# Patient Record
Sex: Female | Born: 1955 | Race: White | Hispanic: No | Marital: Married | Smoking: Former smoker
Health system: Southern US, Community
[De-identification: ages and names within clinical notes are randomized; demographics above are authoritative.]

## PROBLEM LIST (undated history)

## (undated) DIAGNOSIS — K625 Hemorrhage of anus and rectum: Secondary | ICD-10-CM

## (undated) DIAGNOSIS — E785 Hyperlipidemia, unspecified: Secondary | ICD-10-CM

## (undated) DIAGNOSIS — R51 Headache: Secondary | ICD-10-CM

## (undated) DIAGNOSIS — Z8619 Personal history of other infectious and parasitic diseases: Secondary | ICD-10-CM

## (undated) DIAGNOSIS — Z87442 Personal history of urinary calculi: Secondary | ICD-10-CM

## (undated) DIAGNOSIS — T7840XA Allergy, unspecified, initial encounter: Secondary | ICD-10-CM

## (undated) DIAGNOSIS — K635 Polyp of colon: Secondary | ICD-10-CM

## (undated) DIAGNOSIS — H40059 Ocular hypertension, unspecified eye: Secondary | ICD-10-CM

## (undated) DIAGNOSIS — K759 Inflammatory liver disease, unspecified: Secondary | ICD-10-CM

## (undated) DIAGNOSIS — I82409 Acute embolism and thrombosis of unspecified deep veins of unspecified lower extremity: Secondary | ICD-10-CM

## (undated) DIAGNOSIS — F32A Depression, unspecified: Secondary | ICD-10-CM

## (undated) DIAGNOSIS — N390 Urinary tract infection, site not specified: Secondary | ICD-10-CM

## (undated) DIAGNOSIS — H269 Unspecified cataract: Secondary | ICD-10-CM

## (undated) DIAGNOSIS — F329 Major depressive disorder, single episode, unspecified: Secondary | ICD-10-CM

## (undated) DIAGNOSIS — I10 Essential (primary) hypertension: Secondary | ICD-10-CM

## (undated) HISTORY — DX: Ocular hypertension, unspecified eye: H40.059

## (undated) HISTORY — DX: Polyp of colon: K63.5

## (undated) HISTORY — DX: Urinary tract infection, site not specified: N39.0

## (undated) HISTORY — DX: Unspecified cataract: H26.9

## (undated) HISTORY — DX: Allergy, unspecified, initial encounter: T78.40XA

## (undated) HISTORY — PX: TONSILLECTOMY: SUR1361

## (undated) HISTORY — PX: BREAST EXCISIONAL BIOPSY: SUR124

## (undated) HISTORY — DX: Personal history of other infectious and parasitic diseases: Z86.19

## (undated) HISTORY — DX: Essential (primary) hypertension: I10

## (undated) HISTORY — PX: WISDOM TOOTH EXTRACTION: SHX21

## (undated) HISTORY — DX: Acute embolism and thrombosis of unspecified deep veins of unspecified lower extremity: I82.409

---

## 1977-12-07 DIAGNOSIS — I82409 Acute embolism and thrombosis of unspecified deep veins of unspecified lower extremity: Secondary | ICD-10-CM

## 1977-12-07 HISTORY — DX: Acute embolism and thrombosis of unspecified deep veins of unspecified lower extremity: I82.409

## 1992-12-07 HISTORY — PX: BREAST SURGERY: SHX581

## 1998-12-07 HISTORY — PX: ABDOMINAL HYSTERECTOMY: SHX81

## 1999-10-01 ENCOUNTER — Encounter (INDEPENDENT_AMBULATORY_CARE_PROVIDER_SITE_OTHER): Payer: Self-pay | Admitting: Specialist

## 1999-10-01 ENCOUNTER — Inpatient Hospital Stay (HOSPITAL_COMMUNITY): Admission: RE | Admit: 1999-10-01 | Discharge: 1999-10-03 | Payer: Self-pay | Admitting: Obstetrics & Gynecology

## 2003-09-07 ENCOUNTER — Other Ambulatory Visit: Admission: RE | Admit: 2003-09-07 | Discharge: 2003-09-07 | Payer: Self-pay | Admitting: Obstetrics & Gynecology

## 2004-12-04 ENCOUNTER — Encounter: Admission: RE | Admit: 2004-12-04 | Discharge: 2004-12-04 | Payer: Self-pay | Admitting: Neurology

## 2009-04-03 ENCOUNTER — Encounter: Admission: RE | Admit: 2009-04-03 | Discharge: 2009-04-03 | Payer: Self-pay | Admitting: Family Medicine

## 2009-05-03 ENCOUNTER — Emergency Department (HOSPITAL_COMMUNITY): Admission: EM | Admit: 2009-05-03 | Discharge: 2009-05-03 | Payer: Self-pay | Admitting: Emergency Medicine

## 2010-12-14 IMAGING — CR DG HAND COMPLETE 3+V*R*
3 series · 3 of 3 positions shown · non-contrast
Comparison: None

CLINICAL DATA: Swelling about the second and third metacarpal
phalangeal joints

RIGHT HAND - COMPLETE 3+ VIEW

[x hand pa right]
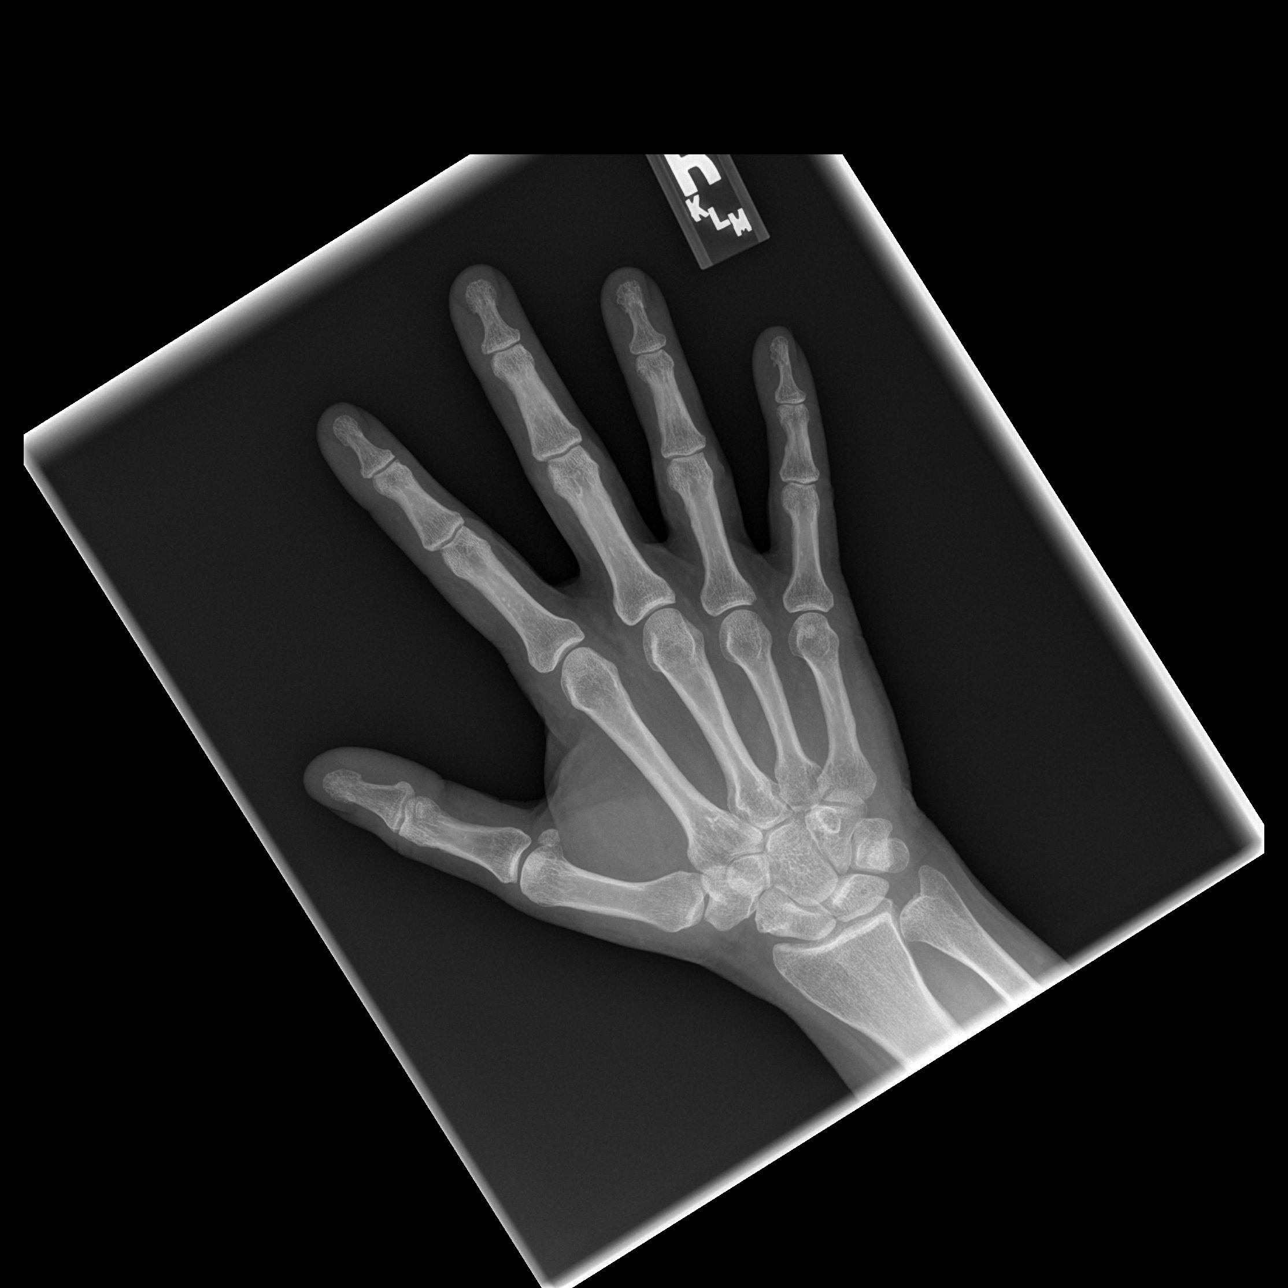

[x hand oblique right]
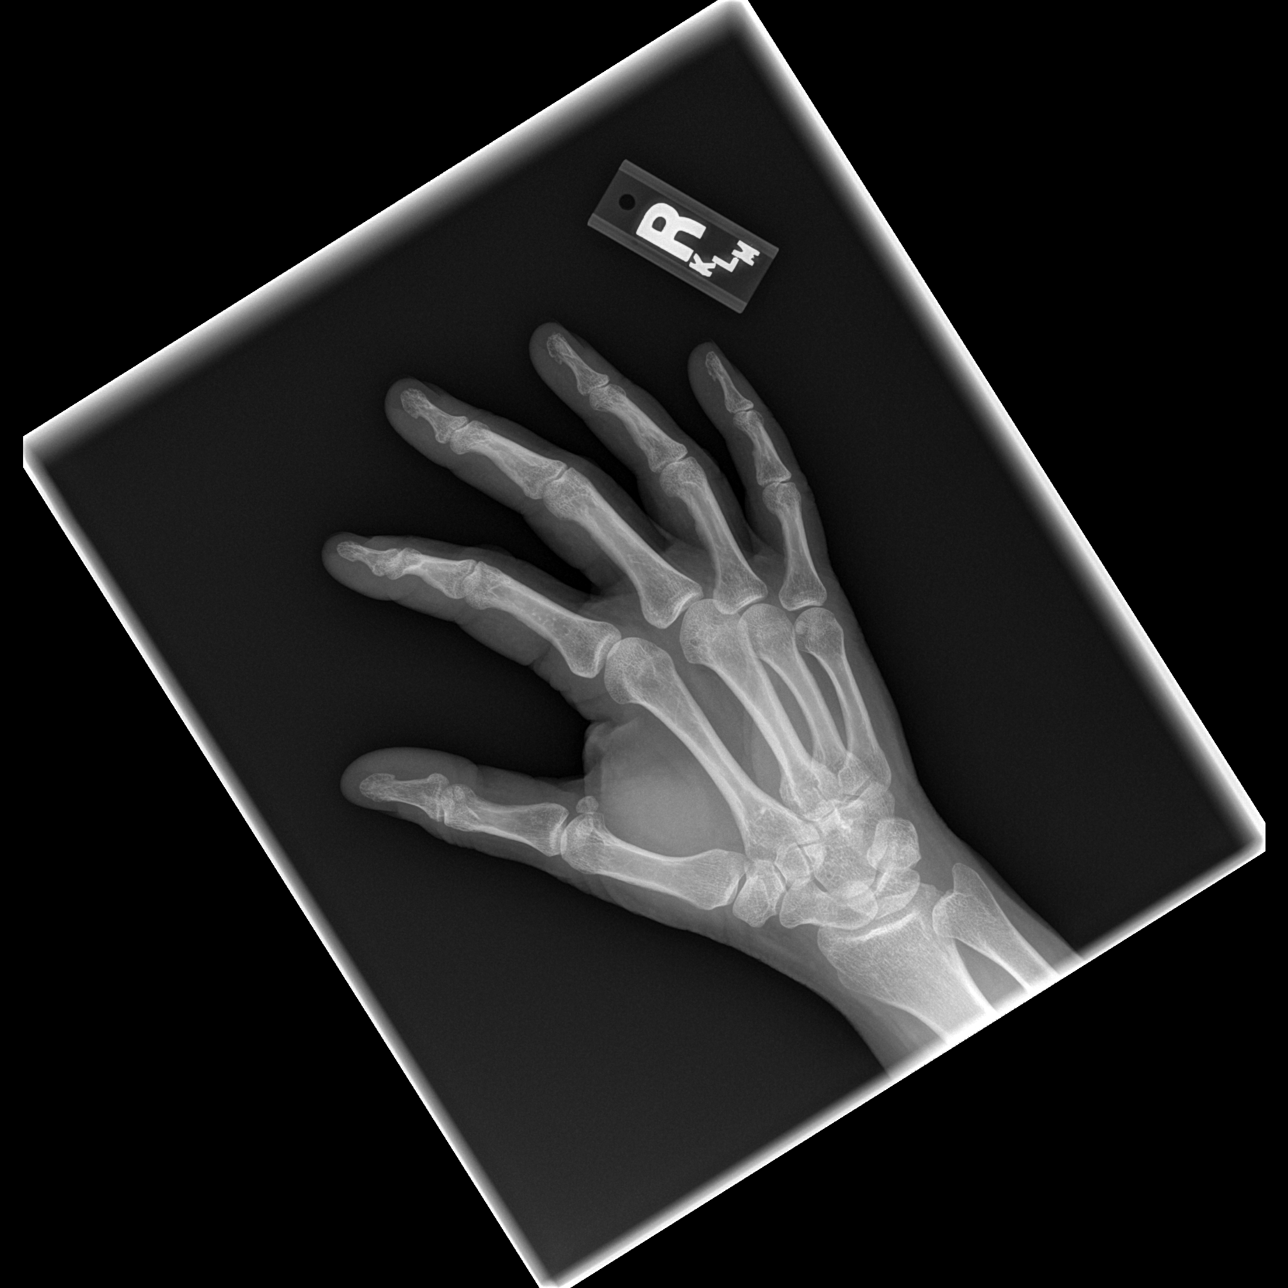

[x hand lat right]
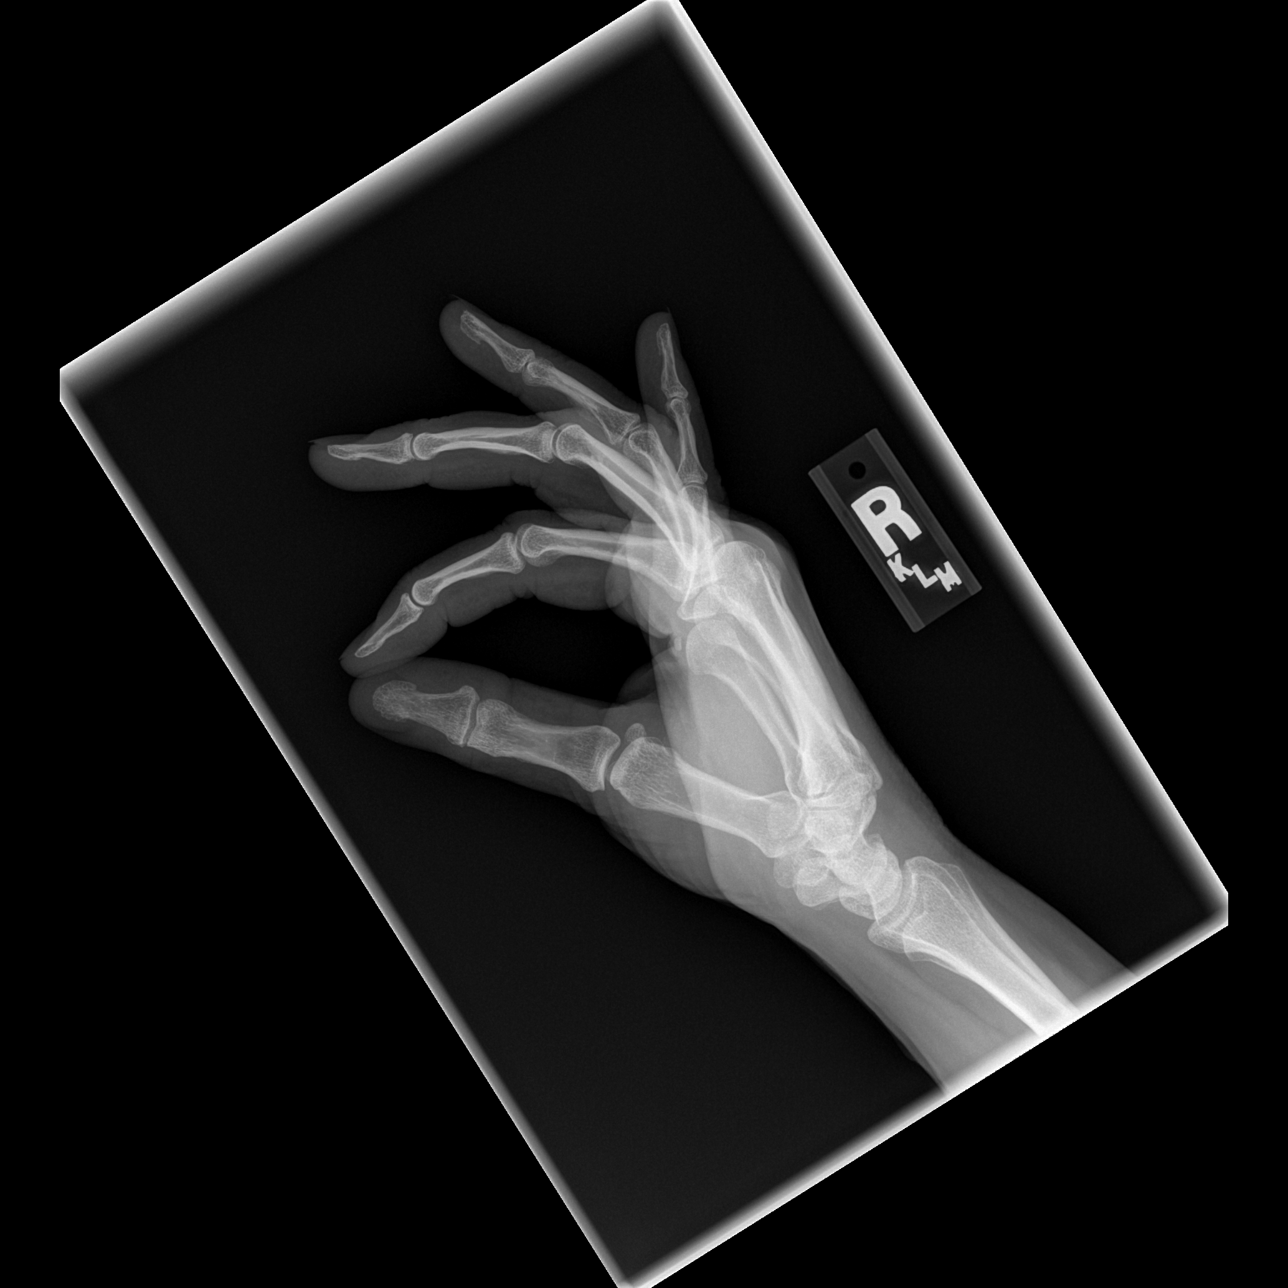

[3 of 3 positions shown; findings below may reference images not displayed]

FINDINGS: No acute fracture or dislocation.  Joint spaces are
maintained.  No erosive changes.  Soft tissues are unremarkable.
IMPRESSION: No acute bony pathology.

## 2010-12-14 IMAGING — CR DG HAND COMPLETE 3+V*L*
3 series · 3 of 3 positions shown · non-contrast
Comparison: None

CLINICAL DATA: Swelling about the second and third metacarpal
phalangeal joints

LEFT HAND - COMPLETE 3+ VIEW

[x hand pa left]
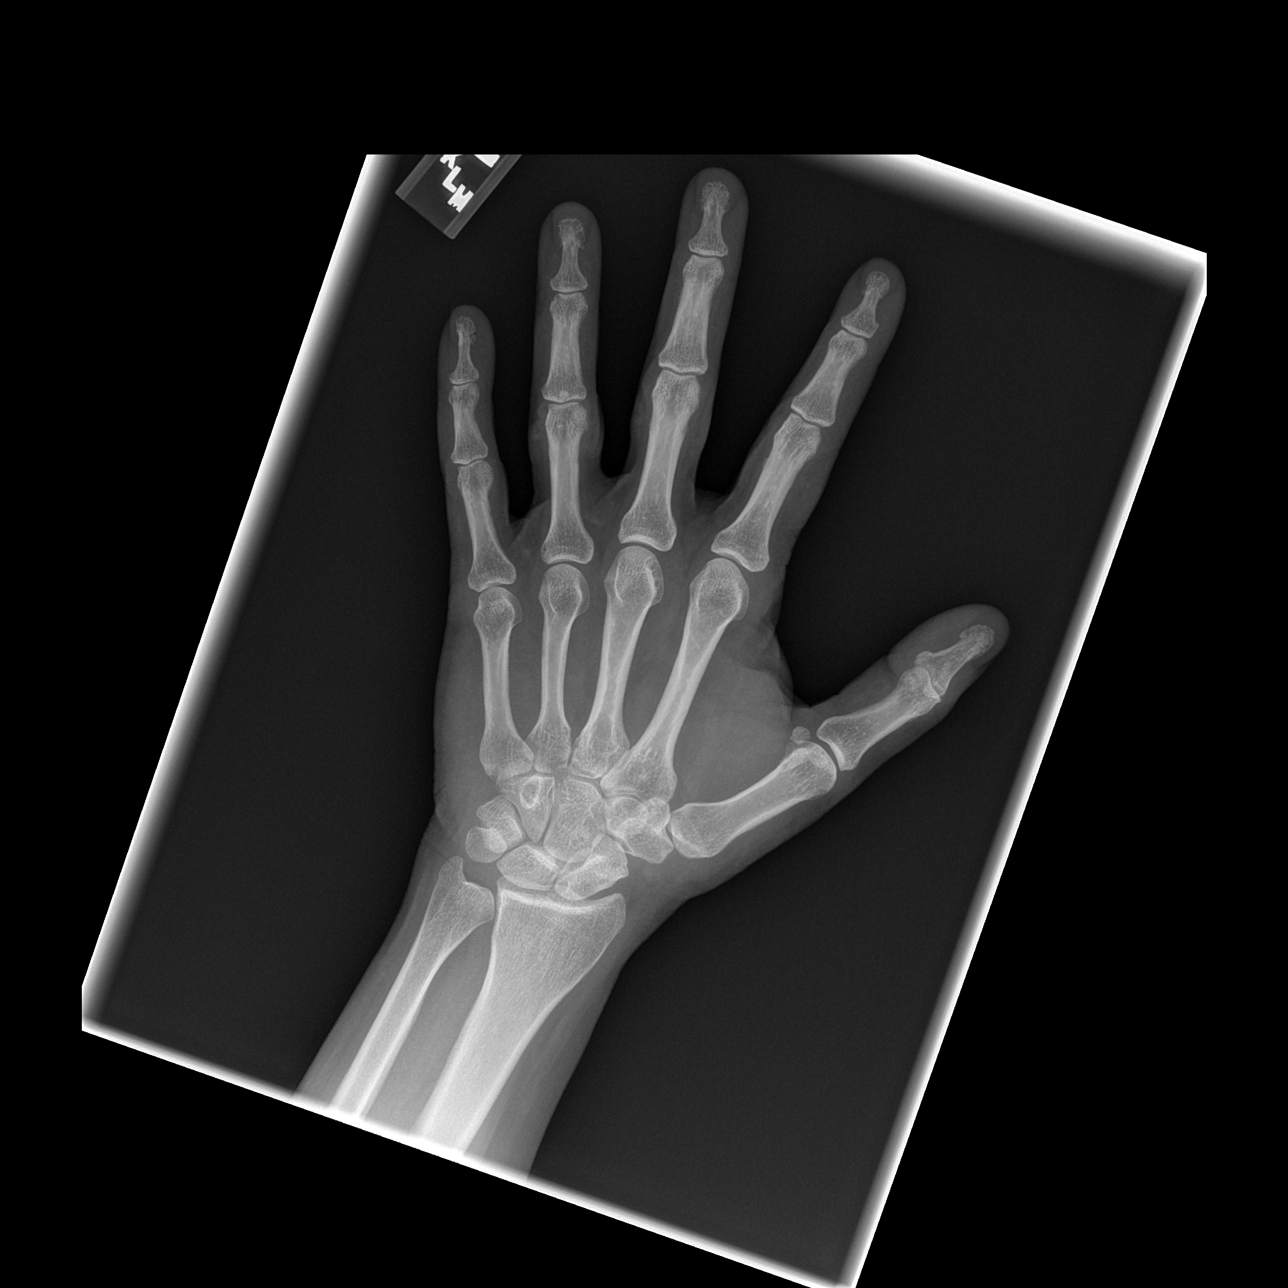

[x hand oblique left]
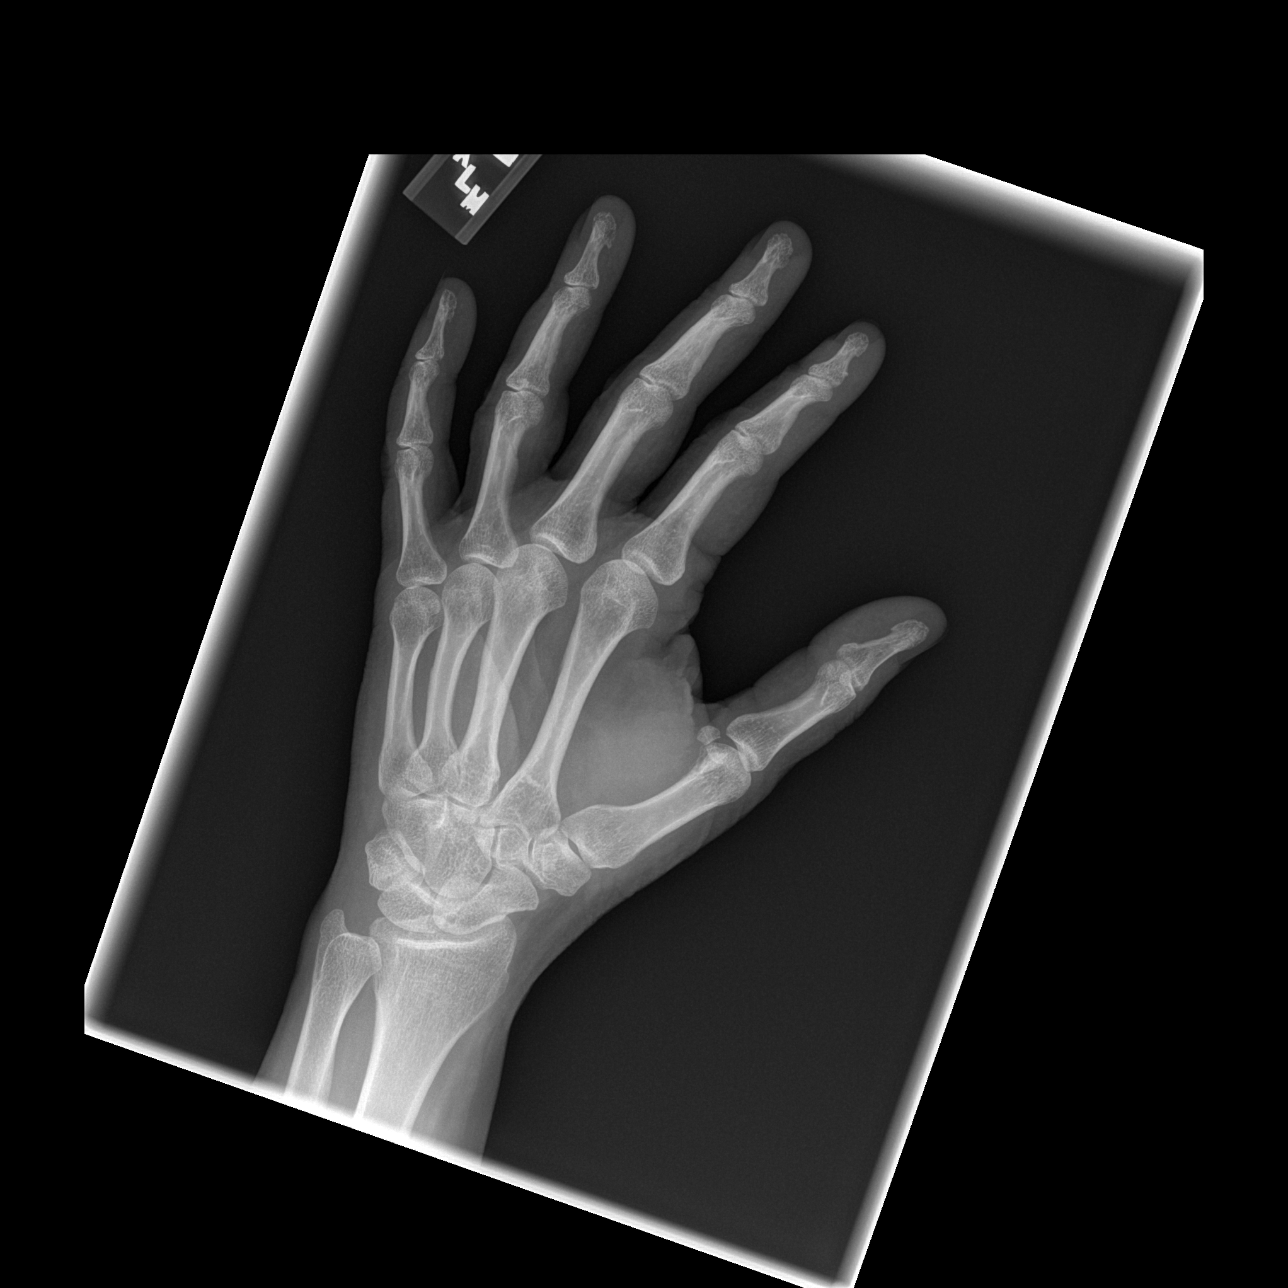

[x hand lat left]
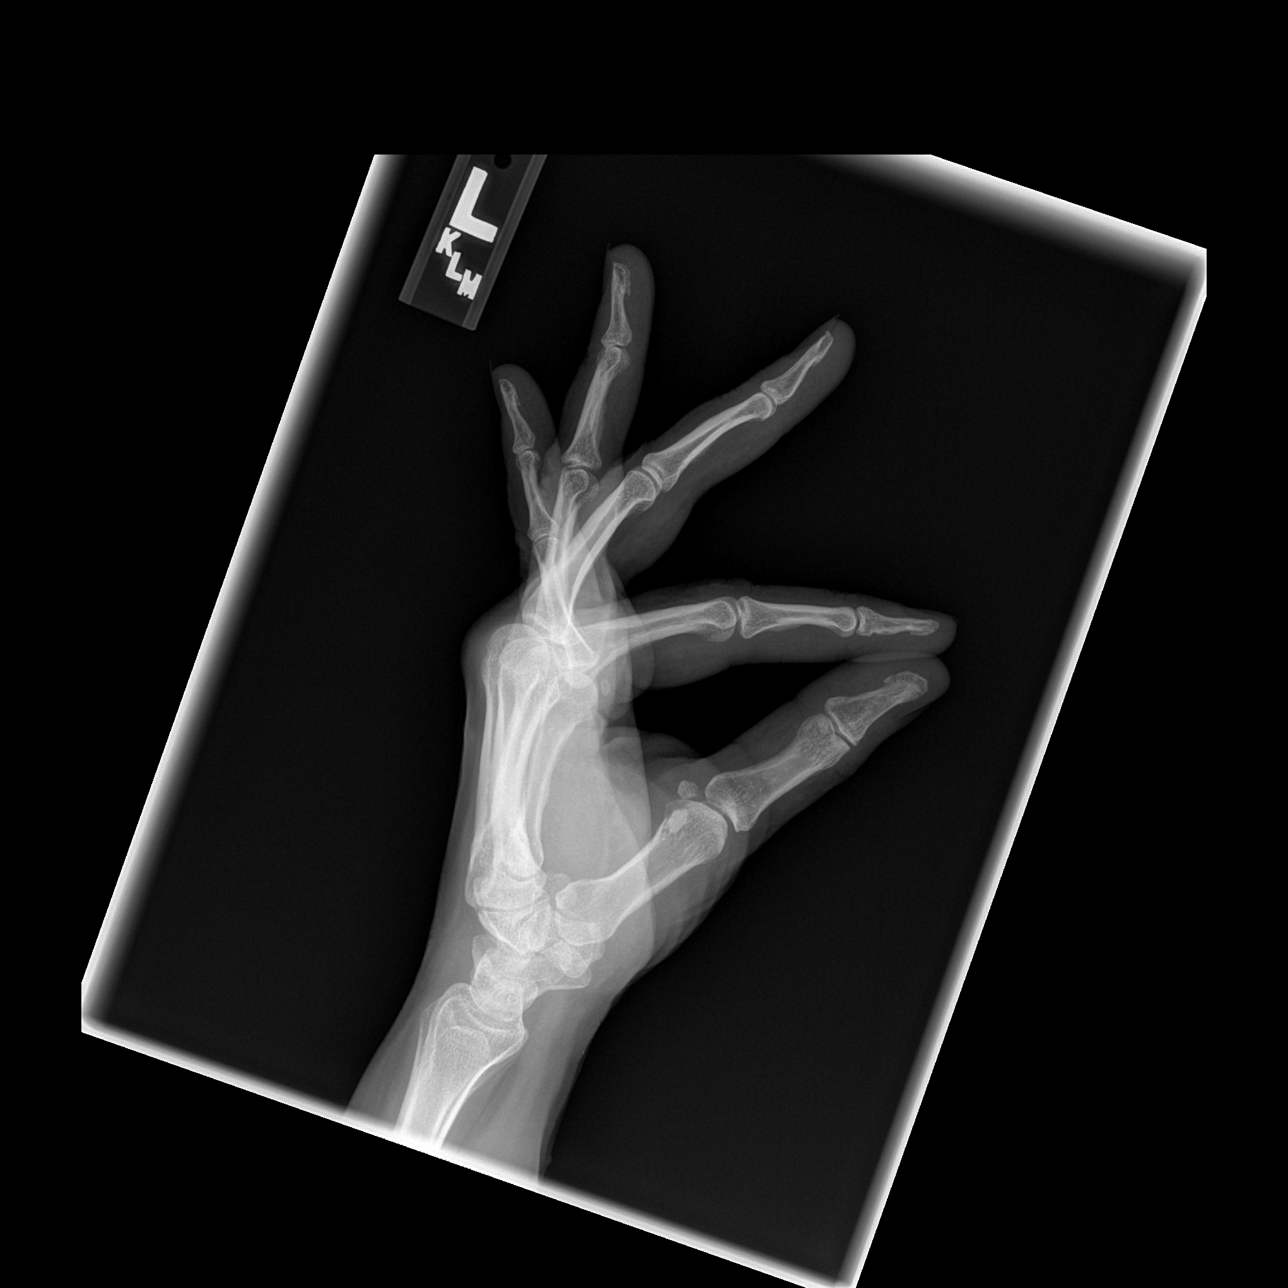

[3 of 3 positions shown; findings below may reference images not displayed]

FINDINGS: No acute fracture or dislocation.  Joint spaces are
maintained.  No erosive changes.  Soft tissues are unremarkable.
IMPRESSION: No acute bony pathology.

## 2011-03-17 LAB — URINE CULTURE: Colony Count: >=100000

## 2011-03-17 LAB — CBC
HCT: 40.1 % (ref 36.0–46.0)
Hemoglobin: 14.1 g/dL (ref 12.0–15.0)
MCHC: 35.2 g/dL (ref 30.0–36.0)
MCV: 90.4 fL (ref 78.0–100.0)
Platelets: 263 10*3/uL (ref 150–400)
RBC: 4.44 MIL/uL (ref 3.87–5.11)
RDW: 12.7 % (ref 11.5–15.5)
WBC: 6.1 10*3/uL (ref 4.0–10.5)

## 2011-03-17 LAB — URINALYSIS, ROUTINE W REFLEX MICROSCOPIC
Bilirubin Urine: NEGATIVE
Glucose, UA: NEGATIVE mg/dL
Hgb urine dipstick: NEGATIVE
Ketones, ur: NEGATIVE mg/dL
Nitrite: NEGATIVE
Protein, ur: NEGATIVE mg/dL
Specific Gravity, Urine: 1.006 (ref 1.005–1.030)
Urobilinogen, UA: 0.2 mg/dL (ref 0.0–1.0)
pH: 7.5 (ref 5.0–8.0)

## 2011-03-17 LAB — POCT CARDIAC MARKERS
CKMB, poc: <1 ng/mL — ABNORMAL LOW (ref 1.0–8.0)
Myoglobin, poc: 47.1 ng/mL (ref 12–200)
Troponin i, poc: <0.05 ng/mL (ref 0.00–0.09)

## 2011-03-17 LAB — DIFFERENTIAL
Basophils Absolute: 0.1 10*3/uL (ref 0.0–0.1)
Basophils Relative: 1 % (ref 0–1)
Eosinophils Absolute: 0 10*3/uL (ref 0.0–0.7)
Eosinophils Relative: 1 % (ref 0–5)
Lymphocytes Relative: 31 % (ref 12–46)
Lymphs Abs: 1.9 10*3/uL (ref 0.7–4.0)
Monocytes Absolute: 0.5 10*3/uL (ref 0.1–1.0)
Monocytes Relative: 9 % (ref 3–12)
Neutro Abs: 3.6 10*3/uL (ref 1.7–7.7)
Neutrophils Relative %: 58 % (ref 43–77)

## 2011-05-29 ENCOUNTER — Emergency Department (HOSPITAL_BASED_OUTPATIENT_CLINIC_OR_DEPARTMENT_OTHER)
Admission: EM | Admit: 2011-05-29 | Discharge: 2011-05-29 | Disposition: A | Discharge status: Home / Self Care | Payer: 59 | Attending: Emergency Medicine | Admitting: Emergency Medicine

## 2011-05-29 ENCOUNTER — Emergency Department (INDEPENDENT_AMBULATORY_CARE_PROVIDER_SITE_OTHER): Payer: 59

## 2011-05-29 DIAGNOSIS — M549 Dorsalgia, unspecified: Secondary | ICD-10-CM | POA: Insufficient documentation

## 2011-05-29 DIAGNOSIS — E7800 Pure hypercholesterolemia, unspecified: Secondary | ICD-10-CM | POA: Insufficient documentation

## 2011-05-29 DIAGNOSIS — R109 Unspecified abdominal pain: Secondary | ICD-10-CM

## 2011-05-29 DIAGNOSIS — E119 Type 2 diabetes mellitus without complications: Secondary | ICD-10-CM | POA: Insufficient documentation

## 2011-05-29 DIAGNOSIS — R091 Pleurisy: Secondary | ICD-10-CM | POA: Insufficient documentation

## 2011-05-29 LAB — DIFFERENTIAL
Basophils Absolute: 0.1 10*3/uL (ref 0.0–0.1)
Basophils Relative: 1 % (ref 0–1)
Eosinophils Absolute: 0.1 10*3/uL (ref 0.0–0.7)
Eosinophils Relative: 1 % (ref 0–5)
Monocytes Absolute: 0.9 10*3/uL (ref 0.1–1.0)
Monocytes Relative: 11 % (ref 3–12)
Neutrophils Relative %: 56 % (ref 43–77)

## 2011-05-29 LAB — BASIC METABOLIC PANEL
BUN: 13 mg/dL (ref 6–23)
Calcium: 9.5 mg/dL (ref 8.4–10.5)
GFR calc Af Amer: >60 mL/min (ref >60)
GFR calc non Af Amer: >60 mL/min (ref >60)
Glucose, Bld: 144 mg/dL — ABNORMAL HIGH (ref 70–99)
Potassium: 3.9 mEq/L (ref 3.5–5.1)
Sodium: 137 mEq/L (ref 135–145)

## 2011-05-29 LAB — CBC
HCT: 40.2 % (ref 36.0–46.0)
Hemoglobin: 14.1 g/dL (ref 12.0–15.0)
MCH: 30.8 pg (ref 26.0–34.0)
MCHC: 35.1 g/dL (ref 30.0–36.0)
MCV: 87.8 fL (ref 78.0–100.0)
Platelets: 256 10*3/uL (ref 150–400)
RBC: 4.58 MIL/uL (ref 3.87–5.11)
RDW: 12.4 % (ref 11.5–15.5)
WBC: 8.4 10*3/uL (ref 4.0–10.5)

## 2012-08-07 HISTORY — PX: COLONOSCOPY: SHX174

## 2012-08-15 NOTE — Anesthesia Preprocedure Evaluation (Signed)
Anesthesia Evaluation  Patient identified by MRN, date of birth, ID band Patient awake    Reviewed: Allergy & Precautions, H&P , NPO status , Patient's Chart, lab work & pertinent test results  Airway Mallampati: II TM Distance: >3 FB Neck ROM: full    Dental  (+) Teeth Intact and Dental Advisory Given   Pulmonary neg pulmonary ROS,  breath sounds clear to auscultation  Pulmonary exam normal       Cardiovascular Exercise Tolerance: Good negative cardio ROS  Rhythm:regular Rate:Normal     Neuro/Psych negative neurological ROS  negative psych ROS   GI/Hepatic negative GI ROS, Neg liver ROS,   Endo/Other  negative endocrine ROS  Renal/GU negative Renal ROS  negative genitourinary   Musculoskeletal   Abdominal   Peds  Hematology negative hematology ROS (+)   Anesthesia Other Findings   Reproductive/Obstetrics negative OB ROS                           Anesthesia Physical Anesthesia Plan  ASA: I  Anesthesia Plan: MAC   Post-op Pain Management:    Induction:   Airway Management Planned:   Additional Equipment:   Intra-op Plan:   Post-operative Plan:   Informed Consent: I have reviewed the patients History and Physical, chart, labs and discussed the procedure including the risks, benefits and alternatives for the proposed anesthesia with the patient or authorized representative who has indicated his/her understanding and acceptance.   Dental Advisory Given  Plan Discussed with: CRNA and Surgeon  Anesthesia Plan Comments:         Anesthesia Quick Evaluation

## 2012-08-16 ENCOUNTER — Encounter (HOSPITAL_COMMUNITY): Payer: Self-pay | Admitting: Anesthesiology

## 2012-08-16 ENCOUNTER — Encounter (HOSPITAL_COMMUNITY): Payer: Self-pay | Admitting: *Deleted

## 2012-08-16 ENCOUNTER — Ambulatory Visit (HOSPITAL_COMMUNITY): Payer: 59 | Admitting: Anesthesiology

## 2012-08-16 ENCOUNTER — Encounter (HOSPITAL_COMMUNITY)
Admission: RE | Disposition: A | Discharge status: Home / Self Care | Payer: Self-pay | Source: Ambulatory Visit | Attending: Gastroenterology

## 2012-08-16 ENCOUNTER — Ambulatory Visit (HOSPITAL_COMMUNITY)
Admission: RE | Admit: 2012-08-16 | Discharge: 2012-08-16 | Disposition: A | Discharge status: Home / Self Care | Payer: 59 | Source: Ambulatory Visit | Attending: Gastroenterology | Admitting: Gastroenterology

## 2012-08-16 DIAGNOSIS — D126 Benign neoplasm of colon, unspecified: Secondary | ICD-10-CM | POA: Insufficient documentation

## 2012-08-16 DIAGNOSIS — R198 Other specified symptoms and signs involving the digestive system and abdomen: Secondary | ICD-10-CM | POA: Insufficient documentation

## 2012-08-16 HISTORY — DX: Hemorrhage of anus and rectum: K62.5

## 2012-08-16 HISTORY — DX: Hyperlipidemia, unspecified: E78.5

## 2012-08-16 HISTORY — DX: Headache: R51

## 2012-08-16 SURGERY — COLONOSCOPY WITH PROPOFOL
Anesthesia: Monitor Anesthesia Care

## 2012-08-16 MED ORDER — PROPOFOL 10 MG/ML IV EMUL
INTRAVENOUS | Status: DC | PRN
  Administered 2012-08-16: 50 ug/kg/min via INTRAVENOUS

## 2012-08-16 MED ORDER — LACTATED RINGERS IV SOLN
INTRAVENOUS | Status: DC
  Administered 2012-08-16: 1000 mL via INTRAVENOUS

## 2012-08-16 MED ORDER — MIDAZOLAM HCL 5 MG/5ML IJ SOLN
INTRAMUSCULAR | Status: DC | PRN
  Administered 2012-08-16: 2 mg via INTRAVENOUS

## 2012-08-16 MED ORDER — FENTANYL CITRATE 0.05 MG/ML IJ SOLN
INTRAMUSCULAR | Status: DC | PRN
  Administered 2012-08-16: 100 ug via INTRAVENOUS

## 2012-08-16 MED ORDER — KETAMINE HCL 50 MG/ML IJ SOLN
INTRAMUSCULAR | Status: DC | PRN
  Administered 2012-08-16: 50 mg via INTRAMUSCULAR

## 2012-08-16 MED ORDER — FENTANYL CITRATE 0.05 MG/ML IJ SOLN: 25.0000 ug | INTRAMUSCULAR | Status: DC | PRN

## 2012-08-16 SURGICAL SUPPLY — 22 items

## 2012-08-16 NOTE — Transfer of Care (Signed)
Immediate Anesthesia Transfer of Care Note  Patient: Laurie Hernandez  Procedure(s) Performed: Procedure(s) (LRB) with comments: COLONOSCOPY WITH PROPOFOL (N/A)  Patient Location: PACU  Anesthesia Type: MAC  Level of Consciousness: awake, alert  and oriented  Airway & Oxygen Therapy: Patient Spontanous Breathing and Patient connected to nasal cannula oxygen  Post-op Assessment: Report given to PACU RN and Post -op Vital signs reviewed and stable  Post vital signs: Reviewed and stable  Complications: No apparent anesthesia complications

## 2012-08-16 NOTE — Op Note (Signed)
[facility] [address] [place], [address]   COLONOSCOPY PROCEDURE REPORT  PATIENT: Laurie Hernandez, Laurie Hernandez  MR#: [id number] BIRTHDATE: 1956-11-30 , 56  yrs. old GENDER: Female ENDOSCOPIST: Willis Modena, MD REFERRED MV:[id number] Valentina Lucks, M.D. PROCEDURE DATE:  08/16/2012 PROCEDURE:   Colonoscopy with snare polypectomy ASA CLASS:   Class I INDICATIONS:change in bowel habits, rectocele, and follow up of adenomatous colonic polyp(s). MEDICATIONS: MAC sedation, administered by CRNA  DESCRIPTION OF PROCEDURE:   After the risks benefits and alternatives of the procedure were thoroughly explained, informed consent was obtained.  A digital rectal exam revealed no abnormalities of the rectum.   The EC-[id number] ([id number])  endoscope was introduced through the anus and advanced to the cecum, which was identified by both the appendix and ileocecal valve. No adverse events experienced.   The quality of the prep was good.  The instrument was then slowly withdrawn as the colon was fully examined.     Findings: No obvious rectocele was seen on today's digital rectal exam; patient was sedated, thus unable to do ValSalva to induce it.  No diverticula seen.  Couple sub-cm polyps seen, one in transverse colon, one in ascending colon, both removed with snare cautery.  No other polyps, masses, vascular ectasias, or inflammatory changes were seen.       Retroflexed views revealed no abnormalities.   . The scope was withdrawn and the procedure completed.  ENDOSCOPIC IMPRESSION:     Couple small polyps, removed, otherwise normal colonoscopy.  RECOMMENDATIONS:     1.  Watch for potential complications of procedure. 2.  Await polypectomy results. 3.  Ok from GI perspective to proceed with any anticipated surgical therapy. 4.  Repeat colonoscopy in 3 years.  eSigned:  Willis Modena, MD 08/16/2012 9:16 AM   cc:

## 2012-08-16 NOTE — Anesthesia Postprocedure Evaluation (Signed)
  Anesthesia Post-op Note  Patient: Laurie Hernandez  Procedure(s) Performed: Procedure(s) (LRB): COLONOSCOPY WITH PROPOFOL (N/A)  Patient Location: PACU  Anesthesia Type: MAC  Level of Consciousness: awake and alert   Airway and Oxygen Therapy: Patient Spontanous Breathing  Post-op Pain: mild  Post-op Assessment: Post-op Vital signs reviewed, Patient's Cardiovascular Status Stable, Respiratory Function Stable, Patent Airway and No signs of Nausea or vomiting  Post-op Vital Signs: stable  Complications: No apparent anesthesia complications

## 2012-08-16 NOTE — H&P (Signed)
Patient interval history reviewed.  Patient examined again.  There has been no change from documented H/P dated 08/11/12 (scanned into chart from our office) except as documented above.  Assessment:  1.  Change in bowel habits. 2.  Personal history of colon polyps. 3.  Rectocele.  Plan:  1.  Colonoscopy today. 2.  Risks (bleeding, infection, bowel perforation that could require surgery, sedation-related changes in cardiopulmonary systems), benefits (identification and possible treatment of source of symptoms, exclusion of certain causes of symptoms), and alternatives (watchful waiting, radiographic imaging studies, empiric medical treatment) of colonoscopy were explained to patient in detail and she wishes to proceed.

## 2012-09-03 ENCOUNTER — Encounter (HOSPITAL_COMMUNITY): Payer: Self-pay | Admitting: Pharmacist

## 2012-09-08 ENCOUNTER — Encounter (HOSPITAL_COMMUNITY): Payer: Self-pay

## 2012-09-08 ENCOUNTER — Encounter (HOSPITAL_COMMUNITY)
Admission: RE | Admit: 2012-09-08 | Discharge: 2012-09-08 | Disposition: A | Discharge status: Home / Self Care | Payer: 59 | Source: Ambulatory Visit | Attending: Obstetrics and Gynecology | Admitting: Obstetrics and Gynecology

## 2012-09-08 HISTORY — DX: Depression, unspecified: F32.A

## 2012-09-08 HISTORY — DX: Personal history of urinary calculi: Z87.442

## 2012-09-08 HISTORY — DX: Major depressive disorder, single episode, unspecified: F32.9

## 2012-09-08 HISTORY — DX: Inflammatory liver disease, unspecified: K75.9

## 2012-09-08 LAB — CBC
HCT: 39.6 % (ref 36.0–46.0)
Hemoglobin: 13.6 g/dL (ref 12.0–15.0)
MCV: 88 fL (ref 78.0–100.0)
RBC: 4.5 MIL/uL (ref 3.87–5.11)
RDW: 12.3 % (ref 11.5–15.5)
WBC: 7.7 10*3/uL (ref 4.0–10.5)

## 2012-09-08 LAB — BASIC METABOLIC PANEL
BUN: 11 mg/dL (ref 6–23)
CO2: 28 mEq/L (ref 19–32)
Chloride: 99 mEq/L (ref 96–112)
Creatinine, Ser: 0.52 mg/dL (ref 0.50–1.10)
Glucose, Bld: 167 mg/dL — ABNORMAL HIGH (ref 70–99)

## 2012-09-08 NOTE — Patient Instructions (Addendum)
   Your procedure is scheduled on: Tuesday, Oct 8th  Enter through the Main Entrance of [facility] at: 8 am Pick up the phone at the desk and dial [phone] and inform us of your arrival.  Please call this number if you have any problems the morning of surgery: [phone]  Remember: Do not eat food after midnight: Monday Do not drink clear liquids after: Monday Take these medicines the morning of surgery with a SIP OF WATER: Effexor xr.  Patient to withhold Monday nights dose of Metformin.  Do not wear jewelry, make-up, or FINGER nail polish No metal in your hair or on your body. Do not wear lotions, powders, perfumes. You may wear deodorant.  Please use your CHG wash as directed prior to surgery.  Do not shave anywhere for at least 12 hours prior to first CHG shower.  Do not bring valuables to the hospital. Contacts, dentures or bridgework may not be worn into surgery.  Leave suitcase in the car. After Surgery it may be brought to your room. For patients being admitted to the hospital, checkout time is 11:00am the day of discharge.  Home with husband Laurie Hernandez or son Laurie Hernandez.  Patients discharged on the day of surgery will not be allowed to drive home.

## 2012-09-12 NOTE — H&P (Signed)
Laurie Hernandez is an 56 y.o. female who has had a hysterectomy but now is having pelvic pressure and pain secondary to a large rectocele. This has been observed over time but now has progressively gotten larger and the symptoms have worsened. At this point the patient desires a repair of the pelvic floor defects in an effort to relieve these symptoms.   Pertinent Gynecological History: Previous GYN Procedures: hysterectomy  Last mammogram: normal Date: 06/10/2012 Last pap: normal Date:2009 OB History: G1, P1  Menstrual History:  No LMP recorded. Patient has had a hysterectomy.    Past Medical History  Diagnosis Date  . Headache     migraines  . Hyperlipidemia   . Diabetes mellitus   . Rectal bleeding   . SVD (spontaneous vaginal delivery)     x 1  . Depression   . History of kidney stones     no surgery required  . Hepatitis     dx in 8th grade-not sure what type, can not donate blood.    Past Surgical History  Procedure Date  . Abdominal hysterectomy   . Tonsillectomy   . Colonoscopy 08/2012    at [facility]  . Breast surgery     Left lumpectomy x 3 -  [facility]  . Wisdom tooth extraction     No family history on file.  Social History:  reports that she has never smoked. She has never used smokeless tobacco. She reports that she drinks about 1.8 ounces of alcohol per week. She reports that she does not use illicit drugs.  Allergies:  Allergies  Allergen Reactions  . Penicillins     As a child - can't remember    No prescriptions prior to admission    ROS Respiratory: no SOB or cough GI: no nausea, vomiting, melena, constipation, or diarrhea GU: no dysuria, no frequency or urgency Gyn: no bleeding discharge or itch  Height 5\' 4"  (1.626 m). Physical Exam  Afebrile  BP 140/64   Respirations 14   Pulse 84  Head: normocephalic and atraumatic Neck: supple, no adenopathy and no thyromegaly Chest: clear to P&A Heart: regular rhythm no murmur or gallop Abdomen: soft  non tender and no evidence of enlargement of the liver kidneys or spleen Pelvic Exam:                         External genitalia: within normal limits                         BUS: within normal limits                         Vagina: grade II/III rectocele, grade I cystocele and no evidence of an enterocele                          Cervix and uterus are absent                          Adnexa: non tender and without masses  No results found for this or any previous visit (from the past 24 hour(s)).  No results found.  Impression: Symptomatic rectocele and pelvic relaxation  Plan: Posterior repair and possible anterior repair.  The risks and limitations of the procedure have been discussed with the patient and informed consent has been obtained  Franciscojavier Wronski 09/12/2012, 5:04 PM

## 2012-09-13 ENCOUNTER — Encounter (HOSPITAL_COMMUNITY)
Admission: AD | Disposition: A | Discharge status: Home / Self Care | Payer: Self-pay | Source: Ambulatory Visit | Attending: Obstetrics and Gynecology

## 2012-09-13 ENCOUNTER — Ambulatory Visit (HOSPITAL_COMMUNITY): Payer: 59 | Admitting: Anesthesiology

## 2012-09-13 ENCOUNTER — Ambulatory Visit (HOSPITAL_COMMUNITY)
Admission: AD | Admit: 2012-09-13 | Discharge: 2012-09-14 | Disposition: A | Discharge status: Home / Self Care | Payer: 59 | Source: Ambulatory Visit | Attending: Obstetrics and Gynecology | Admitting: Obstetrics and Gynecology

## 2012-09-13 ENCOUNTER — Encounter (HOSPITAL_COMMUNITY): Payer: Self-pay | Admitting: Anesthesiology

## 2012-09-13 ENCOUNTER — Encounter (HOSPITAL_COMMUNITY): Payer: Self-pay

## 2012-09-13 DIAGNOSIS — N816 Rectocele: Secondary | ICD-10-CM

## 2012-09-13 DIAGNOSIS — N949 Unspecified condition associated with female genital organs and menstrual cycle: Secondary | ICD-10-CM | POA: Insufficient documentation

## 2012-09-13 DIAGNOSIS — N993 Prolapse of vaginal vault after hysterectomy: Secondary | ICD-10-CM | POA: Insufficient documentation

## 2012-09-13 HISTORY — PX: ANTERIOR AND POSTERIOR REPAIR: SHX5121

## 2012-09-13 LAB — GLUCOSE, CAPILLARY: Glucose-Capillary: 96 mg/dL (ref 70–99)

## 2012-09-13 SURGERY — ANTERIOR (CYSTOCELE) AND POSTERIOR REPAIR (RECTOCELE)
Anesthesia: General | Site: Vagina | Wound class: Clean Contaminated

## 2012-09-13 MED ORDER — MEPERIDINE HCL 25 MG/ML IJ SOLN: 6.2500 mg | INTRAMUSCULAR | Status: DC | PRN

## 2012-09-13 MED ORDER — CEFAZOLIN SODIUM-DEXTROSE 2-3 GM-% IV SOLR
INTRAVENOUS | Status: AC
  Filled 2012-09-13: qty 50

## 2012-09-13 MED ORDER — FENTANYL CITRATE 0.05 MG/ML IJ SOLN
25.0000 ug | INTRAMUSCULAR | Status: DC | PRN
  Administered 2012-09-13 (×3): 50 ug via INTRAVENOUS

## 2012-09-13 MED ORDER — MENTHOL 3 MG MT LOZG: 1.0000 | LOZENGE | OROMUCOSAL | Status: DC | PRN

## 2012-09-13 MED ORDER — ONDANSETRON HCL 4 MG/2ML IJ SOLN
INTRAMUSCULAR | Status: AC
  Filled 2012-09-13: qty 2

## 2012-09-13 MED ORDER — ONDANSETRON HCL 4 MG/2ML IJ SOLN: 4.0000 mg | Freq: Four times a day (QID) | INTRAMUSCULAR | Status: DC | PRN

## 2012-09-13 MED ORDER — OXYCODONE-ACETAMINOPHEN 5-325 MG PO TABS: 1.0000 | ORAL_TABLET | ORAL | Status: DC | PRN

## 2012-09-13 MED ORDER — PROPOFOL 10 MG/ML IV EMUL
INTRAVENOUS | Status: DC | PRN
  Administered 2012-09-13: 150 mg via INTRAVENOUS

## 2012-09-13 MED ORDER — FENTANYL CITRATE 0.05 MG/ML IJ SOLN
INTRAMUSCULAR | Status: AC
  Filled 2012-09-13: qty 2

## 2012-09-13 MED ORDER — LACTATED RINGERS IV SOLN
INTRAVENOUS | Status: DC
  Administered 2012-09-13 (×2): via INTRAVENOUS

## 2012-09-13 MED ORDER — NALOXONE HCL 0.4 MG/ML IJ SOLN: 0.4000 mg | INTRAMUSCULAR | Status: DC | PRN

## 2012-09-13 MED ORDER — DIPHENHYDRAMINE HCL 50 MG/ML IJ SOLN: 12.5000 mg | Freq: Four times a day (QID) | INTRAMUSCULAR | Status: DC | PRN

## 2012-09-13 MED ORDER — LIDOCAINE-EPINEPHRINE 1 %-1:100000 IJ SOLN
INTRAMUSCULAR | Status: DC | PRN
  Administered 2012-09-13: 9 mL

## 2012-09-13 MED ORDER — LIDOCAINE HCL (CARDIAC) 20 MG/ML IV SOLN
INTRAVENOUS | Status: DC | PRN
  Administered 2012-09-13: 50 mg via INTRAVENOUS

## 2012-09-13 MED ORDER — ONDANSETRON HCL 4 MG/2ML IJ SOLN
INTRAMUSCULAR | Status: DC | PRN
  Administered 2012-09-13: 4 mg via INTRAVENOUS

## 2012-09-13 MED ORDER — PROPOFOL 10 MG/ML IV EMUL
INTRAVENOUS | Status: AC
  Filled 2012-09-13: qty 20

## 2012-09-13 MED ORDER — PHENYLEPHRINE 40 MCG/ML (10ML) SYRINGE FOR IV PUSH (FOR BLOOD PRESSURE SUPPORT)
PREFILLED_SYRINGE | INTRAVENOUS | Status: AC
  Filled 2012-09-13: qty 5

## 2012-09-13 MED ORDER — PHENYLEPHRINE HCL 10 MG/ML IJ SOLN
INTRAMUSCULAR | Status: DC | PRN
  Administered 2012-09-13 (×4): 80 ug via INTRAVENOUS

## 2012-09-13 MED ORDER — FENTANYL CITRATE 0.05 MG/ML IJ SOLN
INTRAMUSCULAR | Status: AC
  Administered 2012-09-13: 50 ug via INTRAVENOUS
  Filled 2012-09-13: qty 2

## 2012-09-13 MED ORDER — KETOROLAC TROMETHAMINE 30 MG/ML IJ SOLN: 15.0000 mg | Freq: Once | INTRAMUSCULAR | Status: DC | PRN

## 2012-09-13 MED ORDER — VENLAFAXINE HCL ER 75 MG PO CP24
75.0000 mg | ORAL_CAPSULE | Freq: Every day | ORAL | Status: DC
  Filled 2012-09-13 (×2): qty 1

## 2012-09-13 MED ORDER — FENTANYL CITRATE 0.05 MG/ML IJ SOLN
INTRAMUSCULAR | Status: AC
  Filled 2012-09-13: qty 5

## 2012-09-13 MED ORDER — MORPHINE SULFATE (PF) 1 MG/ML IV SOLN
INTRAVENOUS | Status: DC
  Administered 2012-09-13: 9.5 mg via INTRAVENOUS
  Administered 2012-09-13: 6 mg via INTRAVENOUS
  Administered 2012-09-13: 13:00:00 via INTRAVENOUS
  Administered 2012-09-14 (×2): 1.5 mg via INTRAVENOUS
  Filled 2012-09-13 (×2): qty 25

## 2012-09-13 MED ORDER — LACTATED RINGERS IV SOLN
INTRAVENOUS | Status: DC
  Administered 2012-09-13 – 2012-09-14 (×4): via INTRAVENOUS

## 2012-09-13 MED ORDER — IBUPROFEN 600 MG PO TABS: 600.0000 mg | ORAL_TABLET | Freq: Four times a day (QID) | ORAL | Status: DC | PRN

## 2012-09-13 MED ORDER — MIDAZOLAM HCL 2 MG/2ML IJ SOLN: 0.5000 mg | Freq: Once | INTRAMUSCULAR | Status: DC | PRN

## 2012-09-13 MED ORDER — SODIUM CHLORIDE 0.9 % IJ SOLN: 9.0000 mL | INTRAMUSCULAR | Status: DC | PRN

## 2012-09-13 MED ORDER — FENTANYL CITRATE 0.05 MG/ML IJ SOLN
INTRAMUSCULAR | Status: DC | PRN
  Administered 2012-09-13: 50 ug via INTRAVENOUS
  Administered 2012-09-13: 100 ug via INTRAVENOUS

## 2012-09-13 MED ORDER — CEFAZOLIN SODIUM-DEXTROSE 2-3 GM-% IV SOLR
2.0000 g | Freq: Once | INTRAVENOUS | Status: AC
  Administered 2012-09-13: 2 g via INTRAVENOUS

## 2012-09-13 MED ORDER — MIDAZOLAM HCL 5 MG/5ML IJ SOLN
INTRAMUSCULAR | Status: DC | PRN
  Administered 2012-09-13: 2 mg via INTRAVENOUS

## 2012-09-13 MED ORDER — LIDOCAINE HCL (CARDIAC) 20 MG/ML IV SOLN
INTRAVENOUS | Status: AC
  Filled 2012-09-13: qty 5

## 2012-09-13 MED ORDER — PROMETHAZINE HCL 25 MG/ML IJ SOLN: 6.2500 mg | INTRAMUSCULAR | Status: DC | PRN

## 2012-09-13 MED ORDER — METFORMIN HCL ER 500 MG PO TB24
1000.0000 mg | ORAL_TABLET | Freq: Every day | ORAL | Status: DC
  Administered 2012-09-13: 1000 mg via ORAL
  Filled 2012-09-13: qty 2

## 2012-09-13 MED ORDER — DIPHENHYDRAMINE HCL 12.5 MG/5ML PO ELIX: 12.5000 mg | ORAL_SOLUTION | Freq: Four times a day (QID) | ORAL | Status: DC | PRN

## 2012-09-13 MED ORDER — MIDAZOLAM HCL 2 MG/2ML IJ SOLN
INTRAMUSCULAR | Status: AC
  Filled 2012-09-13: qty 2

## 2012-09-13 SURGICAL SUPPLY — 30 items
BLADE SURG 15 STRL LF C SS BP (BLADE) ×1 IMPLANT
BLADE SURG 15 STRL SS (BLADE) ×2
CANISTER SUCTION 2500CC (MISCELLANEOUS) ×2 IMPLANT
CATH BONANNO SUPRAPUBIC 14G (CATHETERS) IMPLANT
CLOTH BEACON ORANGE TIMEOUT ST (SAFETY) ×2 IMPLANT
DECANTER SPIKE VIAL GLASS SM (MISCELLANEOUS) ×1 IMPLANT
GAUZE PACKING IODOFORM 1 (PACKING) ×1 IMPLANT
GLOVE BIO SURGEON STRL SZ7.5 (GLOVE) ×4 IMPLANT
GLOVE BIOGEL PI IND STRL 6 (GLOVE) ×1 IMPLANT
GLOVE BIOGEL PI IND STRL 7.0 (GLOVE) IMPLANT
GLOVE BIOGEL PI INDICATOR 6 (GLOVE) ×1
GLOVE BIOGEL PI INDICATOR 7.0 (GLOVE) ×2
GLOVE ECLIPSE 6.5 STRL STRAW (GLOVE) ×2 IMPLANT
GLOVE ECLIPSE 7.5 STRL STRAW (GLOVE) ×1 IMPLANT
GOWN PREVENTION PLUS LG XLONG (DISPOSABLE) ×6 IMPLANT
GOWN PREVENTION PLUS XXLARGE (GOWN DISPOSABLE) ×2 IMPLANT
NEEDLE HYPO 22GX1.5 SAFETY (NEEDLE) IMPLANT
NS IRRIG 1000ML POUR BTL (IV SOLUTION) ×2 IMPLANT
PACK VAGINAL WOMENS (CUSTOM PROCEDURE TRAY) ×2 IMPLANT
PAD OB MATERNITY 4.3X12.25 (PERSONAL CARE ITEMS) ×1 IMPLANT
SLEEVE SCD COMPRESS KNEE MED (MISCELLANEOUS) ×1 IMPLANT
SUT VIC AB 0 CT1 18XCR BRD8 (SUTURE) IMPLANT
SUT VIC AB 0 CT1 8-18 (SUTURE) ×2
SUT VIC AB 2-0 CT1 27 (SUTURE)
SUT VIC AB 2-0 CT1 TAPERPNT 27 (SUTURE) IMPLANT
SUT VIC AB 2-0 SH 27 (SUTURE) ×10
SUT VIC AB 2-0 SH 27XBRD (SUTURE) IMPLANT
TOWEL OR 17X24 6PK STRL BLUE (TOWEL DISPOSABLE) ×4 IMPLANT
TRAY FOLEY CATH 14FR (SET/KITS/TRAYS/PACK) ×2 IMPLANT
WATER STERILE IRR 1000ML POUR (IV SOLUTION) ×2 IMPLANT

## 2012-09-13 NOTE — Anesthesia Postprocedure Evaluation (Signed)
  Anesthesia Post-op Note  Patient: Laurie Hernandez  Procedure(s) Performed: Procedure(s) (LRB) with comments: ANTERIOR (CYSTOCELE) AND POSTERIOR REPAIR (RECTOCELE) (N/A)  Patient Location: A-ICU  Anesthesia Type: General  Level of Consciousness: sedated  Airway and Oxygen Therapy: Patient Spontanous Breathing  Post-op Pain: mild  Post-op Assessment: Post-op Vital signs reviewed  Post-op Vital Signs: Reviewed and stable  Complications: No apparent anesthesia complications

## 2012-09-13 NOTE — Anesthesia Preprocedure Evaluation (Signed)
Anesthesia Evaluation  Patient identified by MRN, date of birth, ID band Patient awake    Reviewed: Allergy & Precautions, H&P , Patient's Chart, lab work & pertinent test results, reviewed documented beta blocker date and time   History of Anesthesia Complications Negative for: history of anesthetic complications  Airway Mallampati: II TM Distance: >3 FB Neck ROM: full    Dental No notable dental hx.    Pulmonary neg pulmonary ROS,  breath sounds clear to auscultation  Pulmonary exam normal       Cardiovascular Exercise Tolerance: Good negative cardio ROS  Rhythm:regular Rate:Normal     Neuro/Psych  Headaches, negative neurological ROS  negative psych ROS   GI/Hepatic negative GI ROS, Neg liver ROS, (+) Hepatitis -  Endo/Other  negative endocrine ROSdiabetes  Renal/GU negative Renal ROS     Musculoskeletal   Abdominal   Peds  Hematology negative hematology ROS (+)   Anesthesia Other Findings Headache   migraines Hyperlipidemia        Diabetes mellitus     Rectal bleeding        SVD (spontaneous vaginal delivery)   x 1 Depression        History of kidney stones   no surgery required Hepatitis   dx in 8th grade-not sure what type, can not donate blood.    Reproductive/Obstetrics negative OB ROS                           Anesthesia Physical Anesthesia Plan  ASA: III  Anesthesia Plan: General LMA   Post-op Pain Management:    Induction:   Airway Management Planned:   Additional Equipment:   Intra-op Plan:   Post-operative Plan:   Informed Consent: I have reviewed the patients History and Physical, chart, labs and discussed the procedure including the risks, benefits and alternatives for the proposed anesthesia with the patient or authorized representative who has indicated his/her understanding and acceptance.   Dental Advisory Given  Plan Discussed with: CRNA and  Surgeon  Anesthesia Plan Comments:         Anesthesia Quick Evaluation

## 2012-09-13 NOTE — Brief Op Note (Signed)
09/13/2012  10:50 AM  PATIENT:  Kandra Nicolas  56 y.o. female  PRE-OPERATIVE DIAGNOSIS:  CYSTOCELE RECTOCELE  POST-OPERATIVE DIAGNOSIS:  CYSTOCELE RECTOCELE  PROCEDURE:  Procedure(s) (LRB) with comments: ANTERIOR (CYSTOCELE) AND POSTERIOR REPAIR (RECTOCELE) (N/A)  SURGEON:  Surgeon(s) and Role:    * Miguel Aschoff, MD - Primary    * W Lodema Hong, MD - Assisting    ANESTHESIA:   general  EBL:  Total I/O In: 1300 [I.V.:1300] Out: 250 [Urine:150; Blood:100]  BLOOD ADMINISTERED:none  DRAINS: Urinary Catheter (Foley)   LOCAL MEDICATIONS USED:  LIDOCAINE   SPECIMEN:  No Specimen  DISPOSITION OF SPECIMEN:  N/A  COUNTS:  YES  TOURNIQUET:  * No tourniquets in log *  DICTATION: .Other Dictation: Dictation Number [id number]  PLAN OF CARE: Admit for overnight observation  PATIENT DISPOSITION:  PACU - hemodynamically stable.   Delay start of Pharmacological VTE agent (>24hrs) due to surgical blood loss or risk of bleeding: PAS hose applied

## 2012-09-13 NOTE — Op Note (Signed)
Laurie Hernandez, Laurie Hernandez NO.:  [id number]  MEDICAL RECORD NO.:  [id number]  LOCATION:  9305                          FACILITY:  [facility]  PHYSICIAN:  Miguel Aschoff, M.D.       DATE OF BIRTH:  1956/04/15  DATE OF PROCEDURE:  09/13/2012 DATE OF DISCHARGE:                              OPERATIVE REPORT   PREOPERATIVE DIAGNOSIS:  Symptomatic rectocele and small cystocele.  POSTOPERATIVE DIAGNOSIS:  Symptomatic rectocele and small cystocele.  PROCEDURE:  Anterior and posterior colporrhaphy.  SURGEON:  Miguel Aschoff, MD  ASSISTANT:  Luvenia Redden, MD  ANESTHESIA:  General.  COMPLICATIONS:  None.  JUSTIFICATION:  The patient is a 56 year old white female who presents to the office with complaints of pelvic pressure and pain and on examination was noted to have a large second-to-third-degree rectocele and a small cystocele.  Because the symptoms associated with the pelvic floor defects, she has requested that a procedure would be carried out to return to the anatomy to a more normal configuration.  The risks and indications of the procedure were discussed with the patient, and informed consent has been obtained.  PROCEDURE:  The patient was taken to the operating room and placed in the supine position.  General anesthesia was administered without difficulty.  She was then placed in the dorsal lithotomy position, prepped and draped in usual sterile fashion.  Foley catheter was inserted.  At this point, a weighted speculum was placed in the vaginal vault.  The vaginal cuff was identified and the angles grasped with Allis clamps.  A small cystocele was then identified and Allis clamps were placed in the midline over the cystocele and then the cystocele was injected with 1% Xylocaine with epinephrine for hemostasis.  Then, the overlying mucosa was dissected in the midline and then the cystocele was dissected free from the overlying vaginal mucosa.  Once the cystocele was  outlined, the cystocele was reduced using pursestring sutures of 2-0 Vicryl followed by interrupted 2-0 Vicryl sutures.  Once the cystocele had been reduced, the excess vaginal mucosa was trimmed and then reapproximated using running interlocking 2-0 Vicryl suture, closing dead space along the way.  At this point, the anterior repair had been completed and attention was directed to the posterior vaginal wall.  An ellipse of skin was incised in the perineum and elevated.  Allis clamps were placed in the midline over the large rectocele.  The rectocele was then outlined and the overlying mucosa injected with 1% Xylocaine with epinephrine again for hemostasis.  Then, the overlying vaginal mucosa was separated in the midline from the large rectocele and then dissected laterally until the rectocele had been completely dissected free of the vaginal mucosa and outlined.  At this point, serial 2-0 Vicryl sutures were used to reduce the rectocele.  These were done the serial pursestrings.  Once the rectocele had been reduced, it was reinforced by using interrupted 2-0 Vicryl sutures through the pararectal fascia.  At this point, the excess vaginal mucosa was trimmed and then the vaginal mucosa was reapproximated using running interlocking 2-0 Vicryl sutures again closing the dead space.  This was continued until the perineal body was reached.  At this  point, perineal muscles were reapproximated in the midline using 0 Vicryl sutures.  The perineal body was reconstituted using a crown suture of 0 Vicryl, and at this point, the excess vaginal mucosa was trimmed and then closed using running interlocking 2-0 Vicryl suture.  The perineum was then reconstructed using subcutaneous 2-0 Vicryl suture and then subcuticular 2-0 Vicryl suture.  An Iodoform pack was then placed at the completion of the procedure.  There was good hemostasis.  Estimated blood loss was less than 50 mL.  The patient reversed from  the anesthetic and taken to the recovery room in satisfactory condition.  The plan is for the patient to be observed overnight, at which time, the pack will be removed in the morning and is expected that the patient will be sent home on September 14, 2012.     Miguel Aschoff, M.D.     AR/MEDQ  D:  09/13/2012  T:  09/13/2012  Job:  [id number]

## 2012-09-13 NOTE — H&P (Signed)
Status is unchanged. Will proceed with planned posterior and possible anterior repair.

## 2012-09-13 NOTE — Anesthesia Postprocedure Evaluation (Deleted)
Anesthesia Post Note  Patient: Laurie Hernandez  Procedure(s) Performed: Procedure(s) (LRB): ANTERIOR (CYSTOCELE) AND POSTERIOR REPAIR (RECTOCELE) (N/A)  Anesthesia type: General  Patient location: PACU  Post pain: Pain level controlled  Post assessment: Post-op Vital signs reviewed  Last Vitals:  Filed Vitals:   09/13/12 0804  BP: 143/79  Pulse: 81  Temp: 37 C  Resp: 16    Post vital signs: Reviewed  Level of consciousness: sedated  Complications: No apparent anesthesia complicationsfj

## 2012-09-13 NOTE — Transfer of Care (Signed)
Immediate Anesthesia Transfer of Care Note  Patient: Laurie Hernandez  Procedure(s) Performed: Procedure(s) (LRB) with comments: ANTERIOR (CYSTOCELE) AND POSTERIOR REPAIR (RECTOCELE) (N/A)  Patient Location: PACU  Anesthesia Type: General  Level of Consciousness: sedated  Airway & Oxygen Therapy: Patient Spontanous Breathing and Patient connected to nasal cannula oxygen  Post-op Assessment: Report given to PACU RN  Post vital signs: Reviewed and stable  Complications: No apparent anesthesia complications

## 2012-09-13 NOTE — Anesthesia Postprocedure Evaluation (Signed)
Anesthesia Post Note  Patient: Laurie Hernandez  Procedure(s) Performed: Procedure(s) (LRB): ANTERIOR (CYSTOCELE) AND POSTERIOR REPAIR (RECTOCELE) (N/A)  Anesthesia type: General  Patient location: PACU  Post pain: Pain level controlled  Post assessment: Post-op Vital signs reviewed  Last Vitals:  Filed Vitals:   09/13/12 1115  BP:   Pulse: 75  Temp:   Resp: 20    Post vital signs: Reviewed  Level of consciousness: sedated  Complications: No apparent anesthesia complicationsfj

## 2012-09-13 NOTE — Addendum Note (Signed)
Addendum  created 09/13/12 1841 by Algis Greenhouse, CRNA   Modules edited:Notes Section

## 2012-09-14 ENCOUNTER — Encounter (HOSPITAL_COMMUNITY): Payer: Self-pay | Admitting: Obstetrics and Gynecology

## 2012-09-14 NOTE — Progress Notes (Signed)
Ur chart review completed.  

## 2012-09-14 NOTE — Progress Notes (Signed)
S: Doing well this AM does have moderate perineal pain.  O: Afebrile  V/S Stable  Abdomen is soft perineum is dry   A: Stable S/P A&P repair.  Plan: Discharge home           Return to clinic in 4 weeks            Resume pre op meds            Tylox one every 3 to 4 hours as needed for pain            Carbohydrate restricted diet            Condition improved

## 2013-01-21 ENCOUNTER — Other Ambulatory Visit: Payer: Self-pay

## 2013-10-12 ENCOUNTER — Other Ambulatory Visit: Payer: Self-pay

## 2014-03-07 LAB — HM MAMMOGRAPHY

## 2014-09-21 ENCOUNTER — Other Ambulatory Visit: Payer: Self-pay

## 2014-12-07 DIAGNOSIS — F32A Depression, unspecified: Secondary | ICD-10-CM

## 2014-12-07 HISTORY — DX: Depression, unspecified: F32.A

## 2015-01-09 ENCOUNTER — Encounter: Payer: Self-pay | Admitting: Physician Assistant

## 2015-01-09 ENCOUNTER — Ambulatory Visit (INDEPENDENT_AMBULATORY_CARE_PROVIDER_SITE_OTHER): Payer: 59 | Admitting: Physician Assistant

## 2015-01-09 VITALS — BP 121/74 | HR 72 | Temp 98.5°F | Resp 16 | Ht 64.0 in | Wt 195.4 lb

## 2015-01-09 DIAGNOSIS — IMO0002 Reserved for concepts with insufficient information to code with codable children: Secondary | ICD-10-CM

## 2015-01-09 DIAGNOSIS — F329 Major depressive disorder, single episode, unspecified: Secondary | ICD-10-CM

## 2015-01-09 DIAGNOSIS — F32A Depression, unspecified: Secondary | ICD-10-CM

## 2015-01-09 DIAGNOSIS — G43009 Migraine without aura, not intractable, without status migrainosus: Secondary | ICD-10-CM

## 2015-01-09 DIAGNOSIS — E1165 Type 2 diabetes mellitus with hyperglycemia: Secondary | ICD-10-CM

## 2015-01-09 LAB — COMPREHENSIVE METABOLIC PANEL
ALT: 20 U/L (ref 0–35)
AST: 17 U/L (ref 0–37)
Albumin: 4.4 g/dL (ref 3.5–5.2)
Alkaline Phosphatase: 91 U/L (ref 39–117)
BUN: 11 mg/dL (ref 6–23)
CALCIUM: 9.4 mg/dL (ref 8.4–10.5)
CO2: 29 mEq/L (ref 19–32)
Chloride: 99 mEq/L (ref 96–112)
Creatinine, Ser: 0.62 mg/dL (ref 0.40–1.20)
GFR: 104.49 mL/min (ref >60.00)
GLUCOSE: 221 mg/dL — AB (ref 70–99)
Potassium: 3.7 mEq/L (ref 3.5–5.1)
SODIUM: 136 meq/L (ref 135–145)
Total Bilirubin: 0.6 mg/dL (ref 0.2–1.2)
Total Protein: 7.4 g/dL (ref 6.0–8.3)

## 2015-01-09 LAB — URINALYSIS, ROUTINE W REFLEX MICROSCOPIC
BILIRUBIN URINE: NEGATIVE
HGB URINE DIPSTICK: NEGATIVE
Ketones, ur: NEGATIVE
LEUKOCYTES UA: NEGATIVE
NITRITE: NEGATIVE
RBC / HPF: NONE SEEN (ref 0–?)
SPECIFIC GRAVITY, URINE: 1.01 (ref 1.000–1.030)
Total Protein, Urine: NEGATIVE
UROBILINOGEN UA: 0.2 (ref 0.0–1.0)
Urine Glucose: 500 — AB
pH: 6.5 (ref 5.0–8.0)

## 2015-01-09 LAB — CBC
HCT: 43.6 % (ref 36.0–46.0)
Hemoglobin: 14.8 g/dL (ref 12.0–15.0)
MCHC: 34 g/dL (ref 30.0–36.0)
MCV: 89.1 fl (ref 78.0–100.0)
Platelets: 279 10*3/uL (ref 150.0–400.0)
RBC: 4.9 Mil/uL (ref 3.87–5.11)
RDW: 12.9 % (ref 11.5–15.5)
WBC: 8.4 10*3/uL (ref 4.0–10.5)

## 2015-01-09 LAB — MICROALBUMIN / CREATININE URINE RATIO
CREATININE, U: 68.9 mg/dL
MICROALB/CREAT RATIO: 1 mg/g (ref 0.0–30.0)
Microalb, Ur: <0.7 mg/dL (ref 0.0–1.9)

## 2015-01-09 LAB — HEMOGLOBIN A1C: HEMOGLOBIN A1C: 9.8 % — AB (ref 4.6–6.5)

## 2015-01-09 LAB — TSH: TSH: 2.11 u[IU]/mL (ref 0.35–4.50)

## 2015-01-09 MED ORDER — VENLAFAXINE HCL ER 75 MG PO CP24: 75.0000 mg | ORAL_CAPSULE | Freq: Every day | ORAL | Status: DC

## 2015-01-09 MED ORDER — SUMATRIPTAN SUCCINATE 100 MG PO TABS: 100.0000 mg | ORAL_TABLET | Freq: Once | ORAL | Status: DC

## 2015-01-09 MED ORDER — ATENOLOL 25 MG PO TABS: 25.0000 mg | ORAL_TABLET | Freq: Every day | ORAL | Status: DC

## 2015-01-09 MED ORDER — GLIPIZIDE ER 5 MG PO TB24: 5.0000 mg | ORAL_TABLET | Freq: Every day | ORAL | Status: DC

## 2015-01-09 NOTE — Patient Instructions (Signed)
Please go to the lab for blood work. I will call you with your results and we will add on other medications as indicated.  Please start taking the Glucotrol daily as directed.  Check blood sugar each morning before breakfast and write down measurements.  Please watch the carb intake.  Follow-up with me in 3 weeks for follow-up.  ?bring your blood sugar measurements with you to that visit.

## 2015-01-09 NOTE — Progress Notes (Signed)
Pre visit review using our clinic review tool, if applicable. No additional management support is needed unless otherwise documented below in the visit note/SLS  

## 2015-01-09 NOTE — Progress Notes (Signed)
Patient presents to clinic today to establish care.  Depression -- Patient endorses well-controlled with Effexor Xr 75 mg daily.  Denies depressed mood or anhedonia with medication.  Denies SI/HI.  Migraine Headache -- Infrequent. Imitrex 100 mg for migraine abortive therapy. Endorses well-controlled.  Diabetes Mellitus, II -- Endorses previously on Metformin BID but stopped medication due to side effects.  Has been off of medication for several months. When checked, fasting glucose has been 250+.  Denies hx of neuropathy, nephropathy or retinopathy.   Past Medical History  Diagnosis Date  . Headache(784.0)     migraines  . Hyperlipidemia   . Diabetes mellitus   . Rectal bleeding   . SVD (spontaneous vaginal delivery)     x 1  . Depression   . History of kidney stones     no surgery required  . Hepatitis     dx in 8th grade-not sure what type, can not donate blood.  Marland Kitchen History of chicken pox     childhood  . 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 DVT (deep venous thrombosis)     Left Leg post Fx, 1979  . Colon polyps   . UTI (lower urinary tract infection)     Past Surgical History  Procedure Laterality Date  . Abdominal hysterectomy  2000  . Tonsillectomy    . [facility]oscopy  08/2012    at [facility]  . Breast surgery  1994    Left lumpectomy x 3 -  [facility]  . Wisdom tooth extraction    . Anterior and posterior repair  09/13/2012    Procedure: ANTERIOR (CYSTOCELE) AND POSTERIOR REPAIR (RECTOCELE);  Surgeon: Gus Height, MD;  Location: [facility];  Service: Gynecology;  Laterality: N/A;    Current Outpatient Prescriptions on File Prior to Visit  Medication Sig Dispense Refill  . aspirin 81 MG tablet Take 81 mg by mouth daily.    . calcium gluconate 500 MG tablet Take 500 mg by mouth daily.    . Cinnamon 500 MG capsule Take 500 mg by mouth daily.    Marland Kitchen estrogens conjugated, synthetic A, (CENESTIN) 0.3 MG tablet Take 0.6 mg by mouth at bedtime.     . Multiple Vitamins-Minerals  (MULTIVITAMIN & MINERAL PO) Take by mouth daily.     No current facility-administered medications on file prior to visit.    Allergies  Allergen Reactions  . Penicillins     As a child - can't remember    Family History  Problem Relation Age of Onset  . Arthritis/Rheumatoid Father 65    Deceased  . Colon cancer Father   . Hyperlipidemia Mother     Living  . Heart disease Father   . Hypertension Father   . Diabetes Father   . Ovarian cancer Maternal Grandmother   . Glaucoma Mother   . Thrombocytopenia Brother     #1  . Leukemia Brother     #1  . Hyperlipidemia Brother     #2  . Sleep apnea Brother     #2  . Sudden death Maternal Uncle   . Heart attack Maternal Uncle   . Depression Son   . Sarcoidosis Mother     History   Social History  . Marital Status: Married    Spouse Name: N/A  . Number of Children: N/A  . Years of Education: N/A   Occupational History  . Not on file.   Social History Main Topics  . Smoking status: Former Smoker  Types: Cigarettes  . Smokeless tobacco: Never Used     Comment: Only when teenager for 1.5 years  . Alcohol Use: 1.8 oz/week    3 Cans of beer per week     Comment: socially  . Drug Use: No  . Sexual Activity: Yes    Birth Control/ Protection: None     Comment: hysterectomy   Other Topics Concern  . Not on file   Social History Narrative   ROS Pertinent ROS are listed in HPI.  BP 121/74 mmHg  Pulse 72  Temp(Src) 98.5 F (36.9 C) (Oral)  Resp 16  Ht 5\' 4"  (1.626 m)  Wt 195 lb 6 oz (88.622 kg)  BMI 33.52 kg/m2  SpO2 96%  Physical Exam  Constitutional: She is oriented to person, place, and time and well-developed, well-nourished, and in no distress.  HENT:  Head: Normocephalic and atraumatic.  Eyes: Conjunctivae are normal. Pupils are equal, round, and reactive to light.  Neck: Neck supple.  Cardiovascular: Normal rate, regular rhythm, normal heart sounds and intact distal pulses.   Pulmonary/Chest:  Effort normal and breath sounds normal. No respiratory distress. She has no wheezes. She has no rales. She exhibits no tenderness.  Neurological: She is alert and oriented to person, place, and time.  Skin: Skin is warm and dry. No rash noted.  Psychiatric: Affect normal.  Vitals reviewed.    Assessment/Plan: Migraine without aura and without status migrainosus, not intractable Stable. Infrequent flare-ups.  Continue current regimen.   Depression Well-controlled.  Continue current regimen.   Diabetes mellitus type II, uncontrolled Patient has stopped her metformin many months ago due to side effects.  Endorses fasting glucose 250-300. Denies neuropathy symptoms.  Will refer to Ophthalmology for diabetic eye exam.  Will start Glucotrol XL 5 mg daily.  Will check CMP, A1C, Urine microalbumin and other labs today.  Diabetic diet discussed.  Follow-up in 3-4 weeks.  Bring glucose log to visit.

## 2015-01-20 DIAGNOSIS — F32A Depression, unspecified: Secondary | ICD-10-CM | POA: Insufficient documentation

## 2015-01-20 DIAGNOSIS — F329 Major depressive disorder, single episode, unspecified: Secondary | ICD-10-CM | POA: Insufficient documentation

## 2015-01-20 DIAGNOSIS — E119 Type 2 diabetes mellitus without complications: Secondary | ICD-10-CM | POA: Insufficient documentation

## 2015-01-20 DIAGNOSIS — G43009 Migraine without aura, not intractable, without status migrainosus: Secondary | ICD-10-CM | POA: Insufficient documentation

## 2015-01-20 NOTE — Assessment & Plan Note (Signed)
Well-controlled.  Continue current regimen. 

## 2015-01-20 NOTE — Assessment & Plan Note (Signed)
Stable. Infrequent flare-ups.  Continue current regimen.

## 2015-01-20 NOTE — Assessment & Plan Note (Signed)
Patient has stopped her metformin many months ago due to side effects.  Endorses fasting glucose 250-300. Denies neuropathy symptoms.  Will refer to Ophthalmology for diabetic eye exam.  Will start Glucotrol XL 5 mg daily.  Will check CMP, A1C, Urine microalbumin and other labs today.  Diabetic diet discussed.  Follow-up in 3-4 weeks.  Bring glucose log to visit.

## 2015-01-30 ENCOUNTER — Encounter: Payer: Self-pay | Admitting: Physician Assistant

## 2015-01-30 ENCOUNTER — Ambulatory Visit (INDEPENDENT_AMBULATORY_CARE_PROVIDER_SITE_OTHER): Payer: 59 | Admitting: Physician Assistant

## 2015-01-30 VITALS — BP 104/64 | HR 84 | Temp 98.2°F | Resp 16 | Ht 64.0 in | Wt 186.1 lb

## 2015-01-30 DIAGNOSIS — E1165 Type 2 diabetes mellitus with hyperglycemia: Secondary | ICD-10-CM

## 2015-01-30 DIAGNOSIS — IMO0002 Reserved for concepts with insufficient information to code with codable children: Secondary | ICD-10-CM

## 2015-01-30 MED ORDER — ONETOUCH ULTRASOFT LANCETS MISC: Status: DC

## 2015-01-30 MED ORDER — GLUCOSE BLOOD VI STRP: ORAL_STRIP | Status: DC

## 2015-01-30 NOTE — Patient Instructions (Signed)
Please continue diet regimen and exercise.   Remember our goal fasting blood glucose is 80-130 consistently. If you notice that the blood sugar is getting to low <75, please decrease the Glucotrol to 2.5 mg daily and give me a phone call.  Otherwise we will follow-up in 3 months.  Diabetes and Exercise Exercising regularly is important. It is not just about losing weight. It has many health benefits, such as:  Improving your overall fitness, flexibility, and endurance.  Increasing your bone density.  Helping with weight control.  Decreasing your body fat.  Increasing your muscle strength.  Reducing stress and tension.  Improving your overall health. People with diabetes who exercise gain additional benefits because exercise:  Reduces appetite.  Improves the body's use of blood sugar (glucose).  Helps lower or control blood glucose.  Decreases blood pressure.  Helps control blood lipids (such as cholesterol and triglycerides).  Improves the body's use of the hormone insulin by:  Increasing the body's insulin sensitivity.  Reducing the body's insulin needs.  Decreases the risk for heart disease because exercising:  Lowers cholesterol and triglycerides levels.  Increases the levels of good cholesterol (such as high-density lipoproteins [HDL]) in the body.  Lowers blood glucose levels. YOUR ACTIVITY PLAN  Choose an activity that you enjoy and set realistic goals. Your health care provider or diabetes educator can help you make an activity plan that works for you. Exercise regularly as directed by your health care provider. This includes:  Performing resistance training twice a week such as push-ups, sit-ups, lifting weights, or using resistance bands.  Performing 150 minutes of cardio exercises each week such as walking, running, or playing sports.  Staying active and spending no more than 90 minutes at one time being inactive. Even short bursts of exercise are  good for you. Three 10-minute sessions spread throughout the day are just as beneficial as a single 30-minute session. Some exercise ideas include:  Taking the dog for a walk.  Taking the stairs instead of the elevator.  Dancing to your favorite song.  Doing an exercise video.  Doing your favorite exercise with a friend. RECOMMENDATIONS FOR EXERCISING WITH TYPE 1 OR TYPE 2 DIABETES   Check your blood glucose before exercising. If blood glucose levels are greater than 240 mg/dL, check for urine ketones. Do not exercise if ketones are present.  Avoid injecting insulin into areas of the body that are going to be exercised. For example, avoid injecting insulin into:  The arms when playing tennis.  The legs when jogging.  Keep a record of:  Food intake before and after you exercise.  Expected peak times of insulin action.  Blood glucose levels before and after you exercise.  The type and amount of exercise you have done.  Review your records with your health care provider. Your health care provider will help you to develop guidelines for adjusting food intake and insulin amounts before and after exercising.  If you take insulin or oral hypoglycemic agents, watch for signs and symptoms of hypoglycemia. They include:  Dizziness.  Shaking.  Sweating.  Chills.  Confusion.  Drink plenty of water while you exercise to prevent dehydration or heat stroke. Body water is lost during exercise and must be replaced.  Talk to your health care provider before starting an exercise program to make sure it is safe for you. Remember, almost any type of activity is better than none. Document Released: 02/13/2004 Document Revised: 04/09/2014 Document Reviewed: 05/02/2013 Exit[employer] Patient Information 2015  [employer]. This information is not intended to replace advice given to you by your health care provider. Make sure you discuss any questions you have with your health care provider.

## 2015-01-30 NOTE — Assessment & Plan Note (Signed)
Improving. Goals re-iterated to patient.  Want fasting glucose consistently between 80-130 for now. As sugars are responding very well to medication and new diet regimen, she is to decrease Glucotrol to 2.5 mg if she is getting fasting sugars less than 75-80.  Foot exam unremarkable. Follow-up in 3 months.

## 2015-01-30 NOTE — Progress Notes (Signed)
Patient presents to clinic today for 3 week follow-up of DM II, uncontrolled after starting Glucotrol XL 5 mg daily.  Patient endorses taking medication as directed. HAs started a new diet/weight loss regimen.  Blood sugar log provided -- initially 300, 280 -- now getting down to 180, 150, 130.   Past Medical History  Diagnosis Date  . Headache(784.0)     migraines  . Hyperlipidemia   . Diabetes mellitus   . Rectal bleeding   . SVD (spontaneous vaginal delivery)     x 1  . Depression   . History of kidney stones     no surgery required  . Hepatitis     dx in 8th grade-not sure what type, can not donate blood.  Marland Kitchen History of chicken pox     childhood  . 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 DVT (deep venous thrombosis)     Left Leg post Fx, 1979  . Colon polyps   . UTI (lower urinary tract infection)     Current Outpatient Prescriptions on File Prior to Visit  Medication Sig Dispense Refill  . aspirin 81 MG tablet Take 81 mg by mouth daily.    Marland Kitchen atenolol (TENORMIN) 25 MG tablet Take 1 tablet (25 mg total) by mouth daily. 90 tablet 1  . Biotin 1 MG CAPS Take by mouth every other day.    . calcium gluconate 500 MG tablet Take 500 mg by mouth daily.    . Cinnamon 500 MG capsule Take 500 mg by mouth daily.    Marland Kitchen estrogens conjugated, synthetic A, (CENESTIN) 0.3 MG tablet Take 0.6 mg by mouth at bedtime.     Marland Kitchen glipiZIDE (GLUCOTROL XL) 5 MG 24 hr tablet Take 1 tablet (5 mg total) by mouth daily with breakfast. 30 tablet 1  . glucosamine-chondroitin 500-400 MG tablet Take 1 tablet by mouth as directed.    . magnesium 30 MG tablet Take 30 mg by mouth as directed.    . Multiple Vitamins-Minerals (MULTIVITAMIN & MINERAL PO) Take by mouth daily.    . SUMAtriptan (IMITREX) 100 MG tablet Take 1 tablet (100 mg total) by mouth once. For migraines 30 tablet 1  . venlafaxine XR (EFFEXOR-XR) 75 MG 24 hr capsule Take 1 capsule (75 mg total) by mouth daily. 90 capsule 1   No current  facility-administered medications on file prior to visit.    Allergies  Allergen Reactions  . Penicillins     As a child - can't remember    Family History  Problem Relation Age of Onset  . Arthritis/Rheumatoid Father 65    Deceased  . Colon cancer Father   . Hyperlipidemia Mother     Living  . Heart disease Father   . Hypertension Father   . Diabetes Father   . Ovarian cancer Maternal Grandmother   . Glaucoma Mother   . Thrombocytopenia Brother     #1  . Leukemia Brother     #1  . Hyperlipidemia Brother     #2  . Sleep apnea Brother     #2  . Sudden death Maternal Uncle   . Heart attack Maternal Uncle   . Depression Son   . Sarcoidosis Mother     History   Social History  . Marital Status: Married    Spouse Name: N/A  . Number of Children: N/A  . Years of Education: N/A   Social History Main Topics  . Smoking status: Former Smoker  Types: Cigarettes  . Smokeless tobacco: Never Used     Comment: Only when teenager for 1.5 years  . Alcohol Use: 1.8 oz/week    3 Cans of beer per week     Comment: socially  . Drug Use: No  . Sexual Activity: Yes    Birth Control/ Protection: None     Comment: hysterectomy   Other Topics Concern  . None   Social History Narrative    Review of Systems - See HPI.  All other ROS are negative.  BP 104/64 mmHg  Pulse 84  Temp(Src) 98.2 F (36.8 C) (Oral)  Resp 16  Ht _0  (1.626 m)  Wt 186 lb 2 oz (84.426 kg)  BMI 31.93 kg/m2  SpO2 97%  Physical Exam  Constitutional: She is oriented to person, place, and time and well-developed, well-nourished, and in no distress.  HENT:  Head: Normocephalic and atraumatic.  Eyes: Conjunctivae are normal.  Cardiovascular: Normal rate, regular rhythm, normal heart sounds and intact distal pulses.   Pulmonary/Chest: Effort normal and breath sounds normal. No respiratory distress. She has no wheezes. She has no rales. She exhibits no tenderness.  Neurological: She is alert and  oriented to person, place, and time.  Skin: Skin is warm and dry. No rash noted.  Psychiatric: Affect normal.  Vitals reviewed.   Recent Results (from the past 2160 hour(s))  CBC     Status: None   Collection Time: 01/09/15  2:22 PM  Result Value Ref Range   WBC 8.4 4.0 - 10.5 K/uL   RBC 4.90 3.87 - 5.11 Mil/uL   Platelets 279.0 150.0 - 400.0 K/uL   Hemoglobin 14.8 12.0 - 15.0 g/dL   HCT 43.6 36.0 - 46.0 %   MCV 89.1 78.0 - 100.0 fl   MCHC 34.0 30.0 - 36.0 g/dL   RDW 12.9 11.5 - 15.5 %  Comp Met (CMET)     Status: Abnormal   Collection Time: 01/09/15  2:22 PM  Result Value Ref Range   Sodium 136 135 - 145 mEq/L   Potassium 3.7 3.5 - 5.1 mEq/L   Chloride 99 96 - 112 mEq/L   CO2 29 19 - 32 mEq/L   Glucose, Bld 221 (H) 70 - 99 mg/dL   BUN 11 6 - 23 mg/dL   Creatinine, Ser 0.62 0.40 - 1.20 mg/dL   Total Bilirubin 0.6 0.2 - 1.2 mg/dL   Alkaline Phosphatase 91 39 - 117 U/L   AST 17 0 - 37 U/L   ALT 20 0 - 35 U/L   Total Protein 7.4 6.0 - 8.3 g/dL   Albumin 4.4 3.5 - 5.2 g/dL   Calcium 9.4 8.4 - 10.5 mg/dL   GFR 104.49 >60.00 mL/min  Hemoglobin A1c     Status: Abnormal   Collection Time: 01/09/15  2:22 PM  Result Value Ref Range   Hgb A1c MFr Bld 9.8 (H) 4.6 - 6.5 %    Comment: Glycemic Control Guidelines for People with Diabetes:Non Diabetic:  <6%Goal of Therapy: <7%Additional Action Suggested:  >8%   Urinalysis, Routine w reflex microscopic     Status: Abnormal   Collection Time: 01/09/15  2:22 PM  Result Value Ref Range   Color, Urine YELLOW Yellow;Lt. Yellow   APPearance CLEAR Clear   Specific Gravity, Urine 1.010 1.000-1.030   pH 6.5 5.0 - 8.0   Total Protein, Urine NEGATIVE Negative   Urine Glucose 500 (A) Negative   Ketones, ur NEGATIVE Negative   Bilirubin Urine  NEGATIVE Negative   Hgb urine dipstick NEGATIVE Negative   Urobilinogen, UA 0.2 0.0 - 1.0   Leukocytes, UA NEGATIVE Negative   Nitrite NEGATIVE Negative   WBC, UA 0-2/hpf 0-2/hpf   RBC / HPF none seen  0-2/hpf   Squamous Epithelial / LPF Rare(0-4/hpf) Rare(0-4/hpf)  Urine Microalbumin w/creat. ratio     Status: None   Collection Time: 01/09/15  2:22 PM  Result Value Ref Range   Microalb, Ur <0.7 0.0 - 1.9 mg/dL   Creatinine,U 68.9 mg/dL   Microalb Creat Ratio 1.0 0.0 - 30.0 mg/g  TSH     Status: None   Collection Time: 01/09/15  2:22 PM  Result Value Ref Range   TSH 2.11 0.35 - 4.50 uIU/mL    Assessment/Plan: Diabetes mellitus type II, uncontrolled Improving. Goals re-iterated to patient.  Want fasting glucose consistently between 80-130 for now. As sugars are responding very well to medication and new diet regimen, she is to decrease Glucotrol to 2.5 mg if she is getting fasting sugars less than 75-80.  Foot exam unremarkable. Follow-up in 3 months.

## 2015-02-09 ENCOUNTER — Other Ambulatory Visit: Payer: Self-pay | Admitting: Physician Assistant

## 2015-03-05 ENCOUNTER — Other Ambulatory Visit: Payer: Self-pay | Admitting: Plastic Surgery

## 2015-03-10 ENCOUNTER — Other Ambulatory Visit: Payer: Self-pay | Admitting: Physician Assistant

## 2015-04-30 ENCOUNTER — Encounter: Payer: Self-pay | Admitting: Physician Assistant

## 2015-04-30 ENCOUNTER — Ambulatory Visit (INDEPENDENT_AMBULATORY_CARE_PROVIDER_SITE_OTHER): Payer: 59 | Admitting: Physician Assistant

## 2015-04-30 VITALS — BP 98/51 | HR 67 | Temp 98.0°F | Ht 64.5 in | Wt 170.2 lb

## 2015-04-30 DIAGNOSIS — G43009 Migraine without aura, not intractable, without status migrainosus: Secondary | ICD-10-CM | POA: Diagnosis not present

## 2015-04-30 DIAGNOSIS — IMO0002 Reserved for concepts with insufficient information to code with codable children: Secondary | ICD-10-CM

## 2015-04-30 DIAGNOSIS — E1165 Type 2 diabetes mellitus with hyperglycemia: Secondary | ICD-10-CM

## 2015-04-30 LAB — LIPID PANEL
CHOL/HDL RATIO: 3
Cholesterol: 245 mg/dL — ABNORMAL HIGH (ref 0–200)
HDL: 70.6 mg/dL (ref >39.00)
LDL CALC: 158 mg/dL — AB (ref 0–99)
NonHDL: 174.4
TRIGLYCERIDES: 80 mg/dL (ref 0.0–149.0)
VLDL: 16 mg/dL (ref 0.0–40.0)

## 2015-04-30 LAB — BASIC METABOLIC PANEL
BUN: 10 mg/dL (ref 6–23)
CO2: 29 mEq/L (ref 19–32)
CREATININE: 0.61 mg/dL (ref 0.40–1.20)
Calcium: 9.3 mg/dL (ref 8.4–10.5)
Chloride: 102 mEq/L (ref 96–112)
GFR: 106.36 mL/min (ref >60.00)
GLUCOSE: 148 mg/dL — AB (ref 70–99)
Potassium: 4.1 mEq/L (ref 3.5–5.1)
Sodium: 138 mEq/L (ref 135–145)

## 2015-04-30 LAB — HEMOGLOBIN A1C: Hgb A1c MFr Bld: 5.6 % (ref 4.6–6.5)

## 2015-04-30 MED ORDER — DAPAGLIFLOZIN PROPANEDIOL 5 MG PO TABS: 5.0000 mg | ORAL_TABLET | Freq: Every day | ORAL | Status: DC

## 2015-04-30 MED ORDER — GLIPIZIDE ER 5 MG PO TB24: ORAL_TABLET | ORAL | Status: DC

## 2015-04-30 MED ORDER — ATENOLOL 25 MG PO TABS: 25.0000 mg | ORAL_TABLET | Freq: Every day | ORAL | Status: DC

## 2015-04-30 MED ORDER — ESTRADIOL 0.5 MG PO TABS: 0.5000 mg | ORAL_TABLET | Freq: Every day | ORAL | Status: DC

## 2015-04-30 NOTE — Progress Notes (Signed)
Pre visit review using our clinic review tool, if applicable. No additional management support is needed unless otherwise documented below in the visit note. 

## 2015-04-30 NOTE — Assessment & Plan Note (Signed)
Will repeat A1C, BMP today.  Will obtain lipid panel.  Blood sugars improving but do not want patient chronically on SU therapy if we can help it due to risk of pancreatic burnout.  Will decrease Glucotrol XL to 2.5 mg and begin 5 mg of Farxiga.  Voucher given.  Continue diet and exercise.  Continue fasting sugar logs.  Follow-up in 3 months.

## 2015-04-30 NOTE — Progress Notes (Signed)
Patient presents to clinic today for 33-month follow-up of DM II uncontrolled.  Last A1C 3 months ago at 9.8.  Patient was started on an anti hyperglycemic regimen of Glucotrol 5 mg last visit due to intolerance to Metformin.  Patient has lost 25 pounds since first visit.  Has log of fasting blood sugars with her for review.  Past Medical History  Diagnosis Date  . Headache(784.0)     migraines  . Hyperlipidemia   . Diabetes mellitus   . Rectal bleeding   . SVD (spontaneous vaginal delivery)     x 1  . Depression   . History of kidney stones     no surgery required  . Hepatitis     dx in 8th grade-not sure what type, can not donate blood.  Marland Kitchen History of chicken pox     childhood  . 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 DVT (deep venous thrombosis)     Left Leg post Fx, 1979  . Colon polyps   . UTI (lower urinary tract infection)     Current Outpatient Prescriptions on File Prior to Visit  Medication Sig Dispense Refill  . aspirin 81 MG tablet Take 81 mg by mouth daily.    . Biotin 1 MG CAPS Take by mouth every other day.    . calcium gluconate 500 MG tablet Take 500 mg by mouth daily.    . Cinnamon 500 MG capsule Take 500 mg by mouth daily.    Marland Kitchen glucosamine-chondroitin 500-400 MG tablet Take 1 tablet by mouth as directed.    Marland Kitchen glucose blood test strip Use as instructed 100 each 12  . Lancets (ONETOUCH ULTRASOFT) lancets Use as instructed 100 each 12  . magnesium 30 MG tablet Take 30 mg by mouth as directed.    . Multiple Vitamins-Minerals (MULTIVITAMIN & MINERAL PO) Take by mouth daily.    . SUMAtriptan (IMITREX) 100 MG tablet Take 1 tablet (100 mg total) by mouth once. For migraines 30 tablet 1  . venlafaxine XR (EFFEXOR-XR) 75 MG 24 hr capsule Take 1 capsule (75 mg total) by mouth daily. 90 capsule 1   No current facility-administered medications on file prior to visit.    Allergies  Allergen Reactions  . Penicillins     As a child - can't remember  . Metformin And  Related Nausea And Vomiting    Family History  Problem Relation Age of Onset  . Arthritis/Rheumatoid Father 36    Deceased  . Colon cancer Father   . Hyperlipidemia Mother     Living  . Heart disease Father   . Hypertension Father   . Diabetes Father   . Ovarian cancer Maternal Grandmother   . Glaucoma Mother   . Thrombocytopenia Brother     #1  . Leukemia Brother     #1  . Hyperlipidemia Brother     #2  . Sleep apnea Brother     #2  . Sudden death Maternal Uncle   . Heart attack Maternal Uncle   . Depression Son   . Sarcoidosis Mother     History   Social History  . Marital Status: Married    Spouse Name: N/A  . Number of Children: N/A  . Years of Education: N/A   Social History Main Topics  . Smoking status: Former Smoker    Types: Cigarettes  . Smokeless tobacco: Never Used     Comment: Only when teenager for 1.5 years  .  Alcohol Use: 1.8 oz/week    3 Cans of beer per week     Comment: socially  . Drug Use: No  . Sexual Activity: Yes    Birth Control/ Protection: None     Comment: hysterectomy   Other Topics Concern  . None   Social History Narrative   Review of Systems - See HPI.  All other ROS are negative.  BP 98/51 mmHg  Pulse 67  Temp(Src) 98 F (36.7 C) (Oral)  Ht 5' 4.5" (1.638 m)  Wt 170 lb 3.2 oz (77.202 kg)  BMI 28.77 kg/m2  SpO2 95%  Physical Exam  Constitutional: She is oriented to person, place, and time and well-developed, well-nourished, and in no distress.  HENT:  Head: Normocephalic and atraumatic.  Eyes: Conjunctivae are normal. Pupils are equal, round, and reactive to light.  Neck: Neck supple.  Cardiovascular: Normal rate, regular rhythm, normal heart sounds and intact distal pulses.   Pulmonary/Chest: Effort normal and breath sounds normal. No respiratory distress. She has no wheezes. She has no rales. She exhibits no tenderness.  Neurological: She is alert and oriented to person, place, and time.  Skin: Skin is warm  and dry. No rash noted.  Psychiatric: Affect normal.  Vitals reviewed.  Assessment/Plan: Diabetes mellitus type II, uncontrolled Will repeat A1C, BMP today.  Will obtain lipid panel.  Blood sugars improving but do not want patient chronically on SU therapy if we can help it due to risk of pancreatic burnout.  Will decrease Glucotrol XL to 2.5 mg and begin 5 mg of Farxiga.  Voucher given.  Continue diet and exercise.  Continue fasting sugar logs.  Follow-up in 3 months.

## 2015-04-30 NOTE — Patient Instructions (Signed)
Please go to the lab for blood work.  I will call you with your results. Please decrease the Glucotrol to 2.5 mg (1/2 tablet) daily. Start the [place] daily as directed.  Continue diet and exercise. Continue checking fasting blood sugars. Our goal is fasting sugars 80-130.   Follow-up 3 months.

## 2015-05-08 ENCOUNTER — Telehealth: Payer: Self-pay | Admitting: Physician Assistant

## 2015-05-08 NOTE — Telephone Encounter (Signed)
PA approved effective 04/08/2015 through 05/07/2016. JG//CMA

## 2015-05-08 NOTE — Telephone Encounter (Signed)
PA initiated. Awaiting determination. JG//CMA 

## 2015-05-08 NOTE — Telephone Encounter (Signed)
Relation to pt: self  Call back number: [phone] Pharmacy: [id number] - [place], [place] [phone] (Phone) [phone] (Fax)         Reason for call:  Pt states dapagliflozin propanediol (FARXIGA) 5 MG TABS tablet needs prior auth. Please send RX to  [facility] due to discount coupon that was given by PA.  Pt states [employer] prio-authrization # is   [phone]

## 2015-05-10 ENCOUNTER — Telehealth: Payer: Self-pay | Admitting: *Deleted

## 2015-05-10 MED ORDER — GLIPIZIDE ER 2.5 MG PO TB24: 2.5000 mg | ORAL_TABLET | Freq: Every day | ORAL | Status: DC

## 2015-05-10 NOTE — Telephone Encounter (Signed)
Per Einar Pheasant changed to Glipizide XL 2.5mg  -Take 1 tablet by mouth daily #90,1.  New prescription was faxed to [facility].//AB/CMA

## 2015-05-13 NOTE — Telephone Encounter (Signed)
Pt can be reached at [phone].

## 2015-05-13 NOTE — Telephone Encounter (Signed)
Caller name: Relationship to patient: Can be reached: [facility] [address] [facility] [address]  Reason for call: [facility] I read notes to her and she states that we were to change med to dapagliflozin propanediol (FARXIGA) 5 MG TABS. Lamount Cohen received the prior auth for the med to be covered and pt has the discount card given at appt. Please send order for dapagliflozin propanediol (FARXIGA) 5 MG TABS [facility]. Notify pt when sent please.

## 2015-05-14 MED ORDER — DAPAGLIFLOZIN PROPANEDIOL 5 MG PO TABS: 5.0000 mg | ORAL_TABLET | Freq: Every day | ORAL | Status: DC

## 2015-05-14 NOTE — Addendum Note (Signed)
Addended by: Harl Bowie on: 05/14/2015 11:56 AM   Modules accepted: Orders

## 2015-05-14 NOTE — Telephone Encounter (Signed)
New Rx for dapagliflozin propanediol Wilder Glade) 5mg  was phoned[facility] to [facility]  Called and [facility] @ 11:51am @ [phone]) informing the pt that rx was phoned-in to the pharmacy.//AB/CMA

## 2015-05-29 ENCOUNTER — Telehealth: Payer: Self-pay | Admitting: Physician Assistant

## 2015-05-29 NOTE — Telephone Encounter (Signed)
She did start the [place] and is taking 1 tablet per day.  She has been watching her carbs and nothing has changed with her diet.  She will go back on the glipizide but is concerned that with both meds and no dietary change her blood sugar went to 200.

## 2015-05-29 NOTE — Telephone Encounter (Signed)
If she has not been taking the Glipizide as directed that would explain elevated blood sugar.  Again take both medications daily as directed.  Continue checking fasting sugar, if levels do not decline after restarting the Glipizide, follow-up in office.

## 2015-05-29 NOTE — Telephone Encounter (Signed)
Did she start the [place] yet? I see the prior auth went through.  If she hasn't started medication yet she needs to minimize carbs and start the [place]. She should not have stopped the Glipizide, she should be on 2.5 mg tablet daily.

## 2015-05-29 NOTE — Telephone Encounter (Signed)
Please advise 

## 2015-05-29 NOTE — Telephone Encounter (Signed)
Caller name:Chastidy Relationship to patient:self Can be reached:[phone] Pharmacy:  Reason for call:blood sugar over 200 stopping glipizide please call her to advise what to do

## 2015-07-09 ENCOUNTER — Encounter: Payer: Self-pay | Admitting: *Deleted

## 2015-07-30 ENCOUNTER — Ambulatory Visit (INDEPENDENT_AMBULATORY_CARE_PROVIDER_SITE_OTHER): Payer: 59 | Admitting: Physician Assistant

## 2015-07-30 ENCOUNTER — Encounter: Payer: Self-pay | Admitting: Physician Assistant

## 2015-07-30 VITALS — BP 110/76 | HR 77 | Temp 98.5°F | Resp 16 | Ht 64.5 in | Wt 176.5 lb

## 2015-07-30 DIAGNOSIS — IMO0002 Reserved for concepts with insufficient information to code with codable children: Secondary | ICD-10-CM

## 2015-07-30 DIAGNOSIS — E1165 Type 2 diabetes mellitus with hyperglycemia: Secondary | ICD-10-CM | POA: Diagnosis not present

## 2015-07-30 MED ORDER — DAPAGLIFLOZIN PROPANEDIOL 10 MG PO TABS: 10.0000 mg | ORAL_TABLET | Freq: Every day | ORAL | Status: DC

## 2015-07-30 NOTE — Assessment & Plan Note (Addendum)
Will increase Farxiga to 10 mg daily since decreasing Glucotrol has increased fasting CBGs to 180 range. Continue diet and exercise regimen. Contineu checking fasting CBGs. Follow-up via phone with sugar log in 4 weeks. Follow-up for BMP and A1C in 3 months.

## 2015-07-30 NOTE — Progress Notes (Signed)
Patient presents to clinic today for follow-up of Diabetes Mellitus II, previously controlled with A1C at 5.6. Patient Glucotrol decreased to 2.5 mg daily due to low A1C level. Patient continued on Farxiga. Notes fasting CBGs elevated to 180s-190s with current medication regimen. Has felt somewhat tired. Is still exercising and endorses good diet.   Past Medical History  Diagnosis Date  . Headache(784.0)     migraines  . Hyperlipidemia   . Diabetes mellitus   . Rectal bleeding   . SVD (spontaneous vaginal delivery)     x 1  . Depression   . History of kidney stones     no surgery required  . Hepatitis     dx in 8th grade-not sure what type, can not donate blood.  Marland Kitchen History of chicken pox     childhood  . 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 DVT (deep venous thrombosis)     Left Leg post Fx, 1979  . Colon polyps   . UTI (lower urinary tract infection)     Current Outpatient Prescriptions on File Prior to Visit  Medication Sig Dispense Refill  . aspirin 81 MG tablet Take 81 mg by mouth daily.    Marland Kitchen atenolol (TENORMIN) 25 MG tablet Take 1 tablet (25 mg total) by mouth daily. 90 tablet 1  . calcium gluconate 500 MG tablet Take 500 mg by mouth daily.    . Cinnamon 500 MG capsule Take 500 mg by mouth daily.    Marland Kitchen estradiol (ESTRACE) 0.5 MG tablet Take 1 tablet (0.5 mg total) by mouth daily. 90 tablet 1  . glucosamine-chondroitin 500-400 MG tablet Take 1 tablet by mouth as directed.    Marland Kitchen glucose blood test strip Use as instructed 100 each 12  . Lancets (ONETOUCH ULTRASOFT) lancets Use as instructed 100 each 12  . magnesium 30 MG tablet Take 30 mg by mouth as directed.    . Multiple Vitamins-Minerals (MULTIVITAMIN & MINERAL PO) Take by mouth daily.    . SUMAtriptan (IMITREX) 100 MG tablet Take 1 tablet (100 mg total) by mouth once. For migraines 30 tablet 1   No current facility-administered medications on file prior to visit.    Allergies  Allergen Reactions  . Penicillins     As  a child - can't remember  . Metformin And Related Nausea And Vomiting    Family History  Problem Relation Age of Onset  . Arthritis/Rheumatoid Father 80    Deceased  . Colon cancer Father   . Hyperlipidemia Mother     Living  . Heart disease Father   . Hypertension Father   . Diabetes Father   . Ovarian cancer Maternal Grandmother   . Glaucoma Mother   . Thrombocytopenia Brother     #1  . Leukemia Brother     #1  . Hyperlipidemia Brother     #2  . Sleep apnea Brother     #2  . Sudden death Maternal Uncle   . Heart attack Maternal Uncle   . Depression Son   . Sarcoidosis Mother     Social History   Social History  . Marital Status: Married    Spouse Name: N/A  . Number of Children: N/A  . Years of Education: N/A   Social History Main Topics  . Smoking status: Former Smoker    Types: Cigarettes  . Smokeless tobacco: Never Used     Comment: Only when teenager for 1.5 years  . Alcohol Use: 1.8  oz/week    3 Cans of beer per week     Comment: socially  . Drug Use: No  . Sexual Activity: Yes    Birth Control/ Protection: None     Comment: hysterectomy   Other Topics Concern  . None   Social History Narrative   Review of Systems - See HPI.  All other ROS are negative.  BP 110/76 mmHg  Pulse 77  Temp(Src) 98.5 F (36.9 C) (Oral)  Resp 16  Ht 5' 4.5" (1.638 m)  Wt 176 lb 8 oz (80.06 kg)  BMI 29.84 kg/m2  SpO2 98%  Physical Exam  Constitutional: She is oriented to person, place, and time and well-developed, well-nourished, and in no distress.  HENT:  Head: Normocephalic and atraumatic.  Eyes: Conjunctivae are normal.  Cardiovascular: Normal rate and regular rhythm.   Pulmonary/Chest: Effort normal.  Neurological: She is alert and oriented to person, place, and time.  Skin: Skin is warm and dry.  Psychiatric: Affect normal.  Vitals reviewed.   Assessment/Plan: Diabetes mellitus type II, uncontrolled Will increase Farxiga to 10 mg daily since  decreasing Glucotrol has increased fasting CBGs to 180 range. Continue diet and exercise regimen. Contineu checking fasting CBGs. Follow-up via phone with sugar log in 4 weeks. Follow-up for BMP and A1C in 3 months.

## 2015-07-30 NOTE — Progress Notes (Signed)
Pre visit review using our clinic review tool, if applicable. No additional management support is needed unless otherwise documented below in the visit note/SLS  

## 2015-07-30 NOTE — Patient Instructions (Addendum)
Please start the new dose of the [place] daily as directed. Increase fluid intake. Stay active and continue good diet. Continue fasting blood sugar checks daily. Record these in your journal and call me in 4 weeks with your readings.  Follow-up 3 months for labs.

## 2015-09-29 ENCOUNTER — Other Ambulatory Visit: Payer: Self-pay | Admitting: Physician Assistant

## 2015-10-30 ENCOUNTER — Other Ambulatory Visit (INDEPENDENT_AMBULATORY_CARE_PROVIDER_SITE_OTHER): Payer: 59

## 2015-10-30 ENCOUNTER — Telehealth: Payer: Self-pay | Admitting: Physician Assistant

## 2015-10-30 DIAGNOSIS — E1165 Type 2 diabetes mellitus with hyperglycemia: Secondary | ICD-10-CM | POA: Diagnosis not present

## 2015-10-30 DIAGNOSIS — IMO0001 Reserved for inherently not codable concepts without codable children: Secondary | ICD-10-CM

## 2015-10-30 LAB — BASIC METABOLIC PANEL
BUN: 23 mg/dL (ref 6–23)
CALCIUM: 8.9 mg/dL (ref 8.4–10.5)
CHLORIDE: 103 meq/L (ref 96–112)
CO2: 26 meq/L (ref 19–32)
Creatinine, Ser: 0.57 mg/dL (ref 0.40–1.20)
GFR: 114.83 mL/min (ref >60.00)
GLUCOSE: 153 mg/dL — AB (ref 70–99)
POTASSIUM: 3.8 meq/L (ref 3.5–5.1)
SODIUM: 140 meq/L (ref 135–145)

## 2015-10-30 LAB — HEMOGLOBIN A1C: Hgb A1c MFr Bld: 6.7 % — ABNORMAL HIGH (ref 4.6–6.5)

## 2015-10-30 MED ORDER — DAPAGLIFLOZIN PROPANEDIOL 10 MG PO TABS: 10.0000 mg | ORAL_TABLET | Freq: Every day | ORAL | Status: DC

## 2015-10-30 NOTE — Addendum Note (Signed)
Addended by: Raiford Noble on: 10/30/2015 01:04 PM   Modules accepted: Orders

## 2015-10-30 NOTE — Telephone Encounter (Signed)
Mediation has been sent in. Will call when we are able but we are in clinic until 6 pm. 210 is above goal but thankfully is not extremely high. Again want to see her A1C and BMP so we can select the best medication for her. She was supposed to follow-up 2 months ago with sugar readings.

## 2015-10-30 NOTE — Telephone Encounter (Signed)
Refill sent. Will be getting lab results today or tomorrow. Will alter her regimen further based on results.

## 2015-10-30 NOTE — Telephone Encounter (Signed)
Pt called in to fu on Rx sent. Pt says that she wanted it to go to [id number] - [place], [place] instead of [facility] because she is out.      Blood sugar is extremely high. She would like to discuss further.  On average it is 210.    Pt is requesting a call back to be advised  further.

## 2015-10-30 NOTE — Telephone Encounter (Signed)
Note Copy & Pasted in to Result note. 

## 2015-10-30 NOTE — Telephone Encounter (Signed)
Caller name: [name] Relation to pt: self Call back number: [phone] Pharmacy: [id number] [place], [address] [address] [place]  Reason for call: Pt came in office for lab work, pt thought was going to be seen by provider and stated is needing more meds for dapagliflozin propanediol (FARXIGA) 10 MG TABS tablet, pt  has only 3 pills felt for three days, pt is needing meds since we have long weekend. Pt mention has notice that her sugar level has not gotten better yet even taking the pills. Please advise.

## 2015-11-30 ENCOUNTER — Encounter: Payer: Self-pay | Admitting: Family Medicine

## 2015-11-30 ENCOUNTER — Ambulatory Visit (INDEPENDENT_AMBULATORY_CARE_PROVIDER_SITE_OTHER): Payer: 59 | Admitting: Family Medicine

## 2015-11-30 ENCOUNTER — Other Ambulatory Visit: Payer: Self-pay | Admitting: Family Medicine

## 2015-11-30 VITALS — BP 122/80 | HR 79 | Temp 97.8°F | Ht 64.5 in | Wt 176.0 lb

## 2015-11-30 DIAGNOSIS — Z87442 Personal history of urinary calculi: Secondary | ICD-10-CM | POA: Insufficient documentation

## 2015-11-30 DIAGNOSIS — R300 Dysuria: Secondary | ICD-10-CM

## 2015-11-30 DIAGNOSIS — E1169 Type 2 diabetes mellitus with other specified complication: Secondary | ICD-10-CM | POA: Diagnosis not present

## 2015-11-30 DIAGNOSIS — N390 Urinary tract infection, site not specified: Secondary | ICD-10-CM | POA: Diagnosis not present

## 2015-11-30 LAB — POCT URINALYSIS DIPSTICK
Bilirubin, UA: NEGATIVE
Blood, UA: POSITIVE
Glucose, UA: POSITIVE
Ketones, UA: NEGATIVE
Nitrite, UA: NEGATIVE
PROTEIN UA: NEGATIVE
Spec Grav, UA: 1.02
Urobilinogen, UA: 0.2
pH, UA: 6

## 2015-11-30 LAB — POCT CBG (FASTING - GLUCOSE)-MANUAL ENTRY: Glucose Fasting, POC: 147 mg/dL — AB (ref 70–99)

## 2015-11-30 MED ORDER — PHENAZOPYRIDINE HCL 200 MG PO TABS: 200.0000 mg | ORAL_TABLET | Freq: Three times a day (TID) | ORAL | Status: DC | PRN

## 2015-11-30 MED ORDER — CIPROFLOXACIN HCL 250 MG PO TABS: 250.0000 mg | ORAL_TABLET | Freq: Two times a day (BID) | ORAL | Status: DC

## 2015-11-30 NOTE — Patient Instructions (Signed)

## 2015-11-30 NOTE — Assessment & Plan Note (Signed)
abx per orders Check culture Call or rto prn

## 2015-11-30 NOTE — Progress Notes (Signed)
Pre visit review using our clinic review tool, if applicable. No additional management support is needed unless otherwise documented below in the visit note. 

## 2015-11-30 NOTE — Progress Notes (Signed)
   Subjective:    Patient ID: Laurie Hernandez, female    DOB: 1956-02-17, 59 y.o.   MRN: [id number]  HPI Pt here c/o 1 week of flank pain and dysuria.  She thought she passed a stone Tuesday.  Since then the dysuria , frequency and pressure  Worsening.  She has a hx of kidney stones.     Review of Systems  Constitutional: Negative for fever, chills, activity change and appetite change.  Gastrointestinal: Negative for abdominal pain and abdominal distention.  Genitourinary: Positive for dysuria, urgency and frequency. Negative for hematuria, flank pain, vaginal discharge, difficulty urinating, genital sores, vaginal pain, menstrual problem, pelvic pain and dyspareunia.  Musculoskeletal: Negative for back pain.       Objective:   Physical Exam  Constitutional: She is oriented to person, place, and time. She appears well-developed and well-nourished.  HENT:  Head: Normocephalic and atraumatic.  Eyes: Conjunctivae and EOM are normal.  Neck: Normal range of motion. Neck supple. No JVD present. Carotid bruit is not present. No thyromegaly present.  Cardiovascular: Normal rate, regular rhythm and normal heart sounds.   No murmur heard. Pulmonary/Chest: Effort normal and breath sounds normal. No respiratory distress. She has no wheezes. She has no rales. She exhibits no tenderness.  Musculoskeletal: She exhibits no edema.  Neurological: She is alert and oriented to person, place, and time.  Psychiatric: She has a normal mood and affect. Her behavior is normal.  Nursing note and vitals reviewed.   ua-- + leuk, + bld,  Neg nitrite      Assessment & Plan:

## 2015-12-03 LAB — CULTURE, URINE COMPREHENSIVE

## 2015-12-04 ENCOUNTER — Telehealth: Payer: Self-pay

## 2015-12-04 NOTE — Telephone Encounter (Signed)
Notes Recorded by Rosalita Chessman, DO on 12/03/2015 at 10:54 AM + uti-- Treated with cipro  Patient has been made aware and has completed antibiotics.  BC

## 2015-12-10 ENCOUNTER — Other Ambulatory Visit: Payer: Self-pay | Admitting: Physician Assistant

## 2015-12-24 ENCOUNTER — Other Ambulatory Visit: Payer: Self-pay | Admitting: Physician Assistant

## 2016-01-29 ENCOUNTER — Telehealth: Payer: Self-pay | Admitting: Physician Assistant

## 2016-01-29 NOTE — Telephone Encounter (Signed)
Pt had flu shot 10/2015 at [place] (workplace)

## 2016-01-29 NOTE — Telephone Encounter (Signed)
Updated in Health Maintenance/Immunizations.

## 2016-02-07 ENCOUNTER — Other Ambulatory Visit: Payer: Self-pay | Admitting: Physician Assistant

## 2016-03-21 ENCOUNTER — Other Ambulatory Visit: Payer: Self-pay | Admitting: Physician Assistant

## 2016-05-15 ENCOUNTER — Other Ambulatory Visit: Payer: Self-pay | Admitting: Physician Assistant

## 2016-06-05 ENCOUNTER — Other Ambulatory Visit: Payer: Self-pay | Admitting: Physician Assistant

## 2016-06-05 NOTE — Telephone Encounter (Signed)
Rx request to pharmacy/SLS  

## 2016-06-10 ENCOUNTER — Telehealth: Payer: Self-pay

## 2016-06-10 DIAGNOSIS — E119 Type 2 diabetes mellitus without complications: Secondary | ICD-10-CM

## 2016-06-10 NOTE — Telephone Encounter (Signed)
Patient call would like to have blood work before her 06/23/16 can be reached at [phone]

## 2016-06-12 NOTE — Telephone Encounter (Signed)
Please advise 

## 2016-06-12 NOTE — Telephone Encounter (Signed)
I have placed lab orders. She needs to schedule lab appt at least a few days prior to appt with me so we will have results for review.

## 2016-06-17 ENCOUNTER — Other Ambulatory Visit (INDEPENDENT_AMBULATORY_CARE_PROVIDER_SITE_OTHER): Payer: 59

## 2016-06-17 ENCOUNTER — Other Ambulatory Visit: Payer: Self-pay | Admitting: *Deleted

## 2016-06-17 DIAGNOSIS — E119 Type 2 diabetes mellitus without complications: Secondary | ICD-10-CM

## 2016-06-17 LAB — COMPREHENSIVE METABOLIC PANEL
ALT: 15 U/L (ref 0–35)
AST: 19 U/L (ref 0–37)
Albumin: 4.1 g/dL (ref 3.5–5.2)
Alkaline Phosphatase: 52 U/L (ref 39–117)
BUN: 9 mg/dL (ref 6–23)
CO2: 29 mEq/L (ref 19–32)
Calcium: 9.1 mg/dL (ref 8.4–10.5)
Chloride: 104 mEq/L (ref 96–112)
Creatinine, Ser: 0.63 mg/dL (ref 0.40–1.20)
GFR: 102.09 mL/min (ref >60.00)
Glucose, Bld: 107 mg/dL — ABNORMAL HIGH (ref 70–99)
Potassium: 4 mEq/L (ref 3.5–5.1)
Sodium: 141 mEq/L (ref 135–145)
Total Bilirubin: 0.6 mg/dL (ref 0.2–1.2)
Total Protein: 7 g/dL (ref 6.0–8.3)

## 2016-06-17 LAB — LIPID PANEL
CHOL/HDL RATIO: 4
Cholesterol: 212 mg/dL — ABNORMAL HIGH (ref 0–200)
HDL: 58.6 mg/dL (ref >39.00)
LDL CALC: 135 mg/dL — AB (ref 0–99)
NONHDL: 153.13
TRIGLYCERIDES: 93 mg/dL (ref 0.0–149.0)
VLDL: 18.6 mg/dL (ref 0.0–40.0)

## 2016-06-17 LAB — HEMOGLOBIN A1C: Hgb A1c MFr Bld: 5.8 % (ref 4.6–6.5)

## 2016-06-17 MED ORDER — GLIPIZIDE ER 5 MG PO TB24: ORAL_TABLET | ORAL | Status: DC

## 2016-06-17 NOTE — Progress Notes (Signed)
Rx request to pharmacy/SLS  

## 2016-06-23 ENCOUNTER — Other Ambulatory Visit: Payer: Self-pay | Admitting: *Deleted

## 2016-06-23 ENCOUNTER — Encounter: Payer: Self-pay | Admitting: Physician Assistant

## 2016-06-23 ENCOUNTER — Ambulatory Visit (INDEPENDENT_AMBULATORY_CARE_PROVIDER_SITE_OTHER): Payer: 59 | Admitting: Physician Assistant

## 2016-06-23 VITALS — BP 100/70 | HR 80 | Temp 97.5°F | Resp 16 | Ht 64.5 in | Wt 160.2 lb

## 2016-06-23 DIAGNOSIS — E1165 Type 2 diabetes mellitus with hyperglycemia: Secondary | ICD-10-CM | POA: Diagnosis not present

## 2016-06-23 DIAGNOSIS — Z8639 Personal history of other endocrine, nutritional and metabolic disease: Secondary | ICD-10-CM

## 2016-06-23 DIAGNOSIS — IMO0001 Reserved for inherently not codable concepts without codable children: Secondary | ICD-10-CM

## 2016-06-23 LAB — MICROALBUMIN / CREATININE URINE RATIO
CREATININE, U: 108.4 mg/dL
MICROALB/CREAT RATIO: 0.6 mg/g (ref 0.0–30.0)

## 2016-06-23 LAB — VITAMIN D 25 HYDROXY (VIT D DEFICIENCY, FRACTURES): VITD: 22.31 ng/mL — AB (ref 30.00–100.00)

## 2016-06-23 MED ORDER — GLIPIZIDE ER 5 MG PO TB24: ORAL_TABLET | ORAL | Status: DC

## 2016-06-23 MED ORDER — ERGOCALCIFEROL 1.25 MG (50000 UT) PO CAPS: 50000.0000 [IU] | ORAL_CAPSULE | ORAL | Status: DC

## 2016-06-23 MED ORDER — ATENOLOL 25 MG PO TABS: 25.0000 mg | ORAL_TABLET | Freq: Every day | ORAL | Status: DC

## 2016-06-23 NOTE — Patient Instructions (Signed)
Please go to the lab for a urine sample and a blood test.  Please continue the current medications as directed. Keep up with exercise. Limit carbs but you can allow yourself a beer or a sweet every once in a while to help prevent you from falling off the wagon.  Follow-up in 3 months.  You will be contacted by Dermatology for assessment.   Diabetes and Exercise Exercising regularly is important. It is not just about losing weight. It has many health benefits, such as:  Improving your overall fitness, flexibility, and endurance.  Increasing your bone density.  Helping with weight control.  Decreasing your body fat.  Increasing your muscle strength.  Reducing stress and tension.  Improving your overall health. People with diabetes who exercise gain additional benefits because exercise:  Reduces appetite.  Improves the body's use of blood sugar (glucose).  Helps lower or control blood glucose.  Decreases blood pressure.  Helps control blood lipids (such as cholesterol and triglycerides).  Improves the body's use of the hormone insulin by:  Increasing the body's insulin sensitivity.  Reducing the body's insulin needs.  Decreases the risk for heart disease because exercising:  Lowers cholesterol and triglycerides levels.  Increases the levels of good cholesterol (such as high-density lipoproteins [HDL]) in the body.  Lowers blood glucose levels. YOUR ACTIVITY PLAN  Choose an activity that you enjoy, and set realistic goals. To exercise safely, you should begin practicing any new physical activity slowly, and gradually increase the intensity of the exercise over time. Your health care provider or diabetes educator can help create an activity plan that works for you. General recommendations include:  Encouraging children to engage in at least 60 minutes of physical activity each day.  Stretching and performing strength training exercises, such as yoga or weight  lifting, at least 2 times per week.  Performing a total of at least 150 minutes of moderate-intensity exercise each week, such as brisk walking or water aerobics.  Exercising at least 3 days per week, making sure you allow no more than 2 consecutive days to pass without exercising.  Avoiding long periods of inactivity (90 minutes or more). When you have to spend an extended period of time sitting down, take frequent breaks to walk or stretch. RECOMMENDATIONS FOR EXERCISING WITH TYPE 1 OR TYPE 2 DIABETES   Check your blood glucose before exercising. If blood glucose levels are greater than 240 mg/dL, check for urine ketones. Do not exercise if ketones are present.  Avoid injecting insulin into areas of the body that are going to be exercised. For example, avoid injecting insulin into:  The arms when playing tennis.  The legs when jogging.  Keep a record of:  Food intake before and after you exercise.  Expected peak times of insulin action.  Blood glucose levels before and after you exercise.  The type and amount of exercise you have done.  Review your records with your health care provider. Your health care provider will help you to develop guidelines for adjusting food intake and insulin amounts before and after exercising.  If you take insulin or oral hypoglycemic agents, watch for signs and symptoms of hypoglycemia. They include:  Dizziness.  Shaking.  Sweating.  Chills.  Confusion.  Drink plenty of water while you exercise to prevent dehydration or heat stroke. Body water is lost during exercise and must be replaced.  Talk to your health care provider before starting an exercise program to make sure it is safe for you.  Remember, almost any type of activity is better than none.   This information is not intended to replace advice given to you by your health care provider. Make sure you discuss any questions you have with your health care provider.   Document Released:  02/13/2004 Document Revised: 04/09/2015 Document Reviewed: 05/02/2013 Elsevier Interactive Patient Education [employer].

## 2016-06-23 NOTE — Progress Notes (Addendum)
History of Present Illness: Patient is a 60 y.o. female who presents to clinic today for follow-up of Diabetes Mellitus II, without complication.  Patient currently on medication regimen of Farxiga 10 mg daily and Glucotrol XL 5 mg daily.  Is taking medications as directed. Endorses working very hard on diet and exercise. Has cut out all carbs from diet. Is only drinking water.  Feels she may not be able to continue such a rigid regimen long-term.  Denies vision changes, numbness or tingling of extremities. Is checking blood glucose as directed. Averaging 90-100.   Latest Maintenance: A1C --  Lab Results  Component Value Date   HGBA1C 5.8 06/17/2016   Diabetic Eye Exam -- Endorses 05/05/2016. No retinopathy. Urine Microalbumin -- Overdue. Foot Exam -- Overdue. Denies sores or lesions of feet. Endorses good and regular nail care.   Past Medical History  Diagnosis Date  . Headache(784.0)     migraines  . Hyperlipidemia   . Diabetes mellitus   . Rectal bleeding   . SVD (spontaneous vaginal delivery)     x 1  . Depression   . History of kidney stones     no surgery required  . Hepatitis     dx in 8th grade-not sure what type, can not donate blood.  Marland Kitchen History of chicken pox     childhood  . 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 DVT (deep venous thrombosis) (HCC)     Left Leg post Fx, 1979  . Colon polyps   . UTI (lower urinary tract infection)     Current Outpatient Prescriptions on File Prior to Visit  Medication Sig Dispense Refill  . aspirin 81 MG tablet Take 81 mg by mouth daily.    . Biotin (BIOTIN 5000) 5 MG CAPS Take 1 capsule by mouth daily.    . calcium gluconate 500 MG tablet Take 500 mg by mouth daily.    . Cinnamon 500 MG capsule Take 500 mg by mouth daily.    Marland Kitchen estradiol (ESTRACE) 0.5 MG tablet TAKE 1 TABLET DAILY 90 tablet 0  . FARXIGA 10 MG TABS tablet TAKE 1 TABLET BY MOUTH EVERY DAY 30 tablet 3  . glucosamine-chondroitin 500-400 MG tablet Take 1 tablet by mouth  as directed.    Marland Kitchen glucose blood test strip Use as instructed 100 each 12  . Lancets (ONETOUCH ULTRASOFT) lancets Use as instructed 100 each 12  . magnesium 30 MG tablet Take 30 mg by mouth as directed.    . Multiple Vitamins-Minerals (MULTIVITAMIN & MINERAL PO) Take by mouth daily.    . SUMAtriptan (IMITREX) 100 MG tablet Take 1 tablet (100 mg total) by mouth once. For migraines 30 tablet 1  . venlafaxine XR (EFFEXOR-XR) 37.5 MG 24 hr capsule Take 37.5 mg by mouth daily with breakfast.     No current facility-administered medications on file prior to visit.    Allergies  Allergen Reactions  . Penicillins     As a child - can't remember  . Metformin And Related Nausea And Vomiting    Family History  Problem Relation Age of Onset  . Arthritis/Rheumatoid Father 34    Deceased  . Colon cancer Father   . Hyperlipidemia Mother     Living  . Heart disease Father   . Hypertension Father   . Diabetes Father   . Ovarian cancer Maternal Grandmother   . Glaucoma Mother   . Thrombocytopenia Brother     #1  . Leukemia  Brother     #1  . Hyperlipidemia Brother     #2  . Sleep apnea Brother     #2  . Sudden death Maternal Uncle   . Heart attack Maternal Uncle   . Depression Son   . Sarcoidosis Mother     Social History   Social History  . Marital Status: Married    Spouse Name: N/A  . Number of Children: N/A  . Years of Education: N/A   Social History Main Topics  . Smoking status: Former Smoker    Types: Cigarettes  . Smokeless tobacco: Never Used     Comment: Only when teenager for 1.5 years  . Alcohol Use: 1.8 oz/week    3 Cans of beer per week     Comment: socially  . Drug Use: No  . Sexual Activity: Yes    Birth Control/ Protection: None     Comment: hysterectomy   Other Topics Concern  . None   Social History Narrative   Review of Systems: Pertinent ROS are listed in HPI  Physical Examination: BP 100/70 mmHg  Pulse 80  Temp(Src) 97.5 F (36.4 C)  (Oral)  Resp 16  Ht 5' 4.5" (1.638 m)  Wt 160 lb 4 oz (72.689 kg)  BMI 27.09 kg/m2  SpO2 98% General appearance: alert, cooperative, appears stated age and no distress Lungs: clear to auscultation bilaterally Heart: regular rate and rhythm, S1, S2 normal, no murmur, click, rub or gallop Extremities: extremities normal, atraumatic, no cyanosis or edema Pulses: 2+ and symmetric Skin: Skin color, texture, turgor normal. No rashes or lesions Neurologic: Alert and oriented X 3, normal strength and tone. Normal symmetric reflexes. Normal coordination and gait  Diabetic Foot Exam - Simple   Simple Foot Form  Diabetic Foot exam was performed with the following findings:  Yes 06/23/2016  8:09 AM  Visual Inspection  Sensation Testing  Pulse Check  Comments  Normal Monofilament testing.     Assessment/Plan: Diabetes mellitus type II, uncontrolled A1C looks good on current regimen. Discussed her strict diet and some slight changes she can make to make it less rigid but still get good results. Afraid she will not sustain her current regimen. Continue exercise. Will hold off on medication changes while she is relaxing her diet somewhat. Will obtain urine microalbumin today as is overdue. Will repeat other labs in 3 months. If not climbing much, will start to wean down medications. Will check urine microalbumin today as this is overdue. Foot exam within normal limits.   History of vitamin D deficiency Discussed with patient. Will reassess Vitamin D levels today.

## 2016-06-23 NOTE — Telephone Encounter (Signed)
See phone note

## 2016-06-23 NOTE — Progress Notes (Signed)
Pre visit review using our clinic review tool, if applicable. No additional management support is needed unless otherwise documented below in the visit note/SLS  

## 2016-06-23 NOTE — Telephone Encounter (Signed)
Notified pt and she is agreeable to start vitamin D. Pt states she already has appt with PCP in 3 months and will do lab at that visit. Rx sent to pharmacy.

## 2016-06-23 NOTE — Assessment & Plan Note (Addendum)
A1C looks good on current regimen. Discussed her strict diet and some slight changes she can make to make it less rigid but still get good results. Afraid she will not sustain her current regimen. Continue exercise. Will hold off on medication changes while she is relaxing her diet somewhat. Will obtain urine microalbumin today as is overdue. Will repeat other labs in 3 months. If not climbing much, will start to wean down medications. Will check urine microalbumin today as this is overdue. Foot exam within normal limits.

## 2016-06-23 NOTE — Telephone Encounter (Signed)
Notes Recorded by Ronny Flurry, CMA on 06/23/2016 at 4:06 PM Left message for pt to return my call.

## 2016-06-23 NOTE — Telephone Encounter (Signed)
-----   Message from Brunetta Jeans, PA-C sent at 06/23/2016  1:26 PM EDT ----- Urine microalbumin looks good. Vitamin D mildly decreased. Recommend Ergocalciferol 50,000 units once weekly for 8 weeks. Then start OTC 1000 unit Vitamin D daily. Ok to send in Rx, quantity 8. Repeat Vitamin D in 3 months.

## 2016-06-27 DIAGNOSIS — Z8639 Personal history of other endocrine, nutritional and metabolic disease: Secondary | ICD-10-CM | POA: Insufficient documentation

## 2016-06-27 NOTE — Assessment & Plan Note (Signed)
Discussed with patient. Will reassess Vitamin D levels today.

## 2016-09-03 ENCOUNTER — Other Ambulatory Visit: Payer: Self-pay | Admitting: Physician Assistant

## 2016-09-13 ENCOUNTER — Other Ambulatory Visit: Payer: Self-pay | Admitting: Physician Assistant

## 2016-09-14 NOTE — Telephone Encounter (Signed)
Rx request for Vit D2 Denied, pt is to start taking OTC Vit D3 1000 units daily and also, needs labs prior/SLS 10/09

## 2016-09-23 ENCOUNTER — Ambulatory Visit: Payer: 59 | Admitting: Physician Assistant

## 2016-09-30 ENCOUNTER — Ambulatory Visit (INDEPENDENT_AMBULATORY_CARE_PROVIDER_SITE_OTHER): Payer: 59 | Admitting: Physician Assistant

## 2016-09-30 ENCOUNTER — Encounter: Payer: Self-pay | Admitting: Physician Assistant

## 2016-09-30 VITALS — BP 96/68 | HR 63 | Temp 97.8°F | Resp 16 | Ht 65.0 in | Wt 152.4 lb

## 2016-09-30 DIAGNOSIS — Z1231 Encounter for screening mammogram for malignant neoplasm of breast: Secondary | ICD-10-CM

## 2016-09-30 DIAGNOSIS — Z1239 Encounter for other screening for malignant neoplasm of breast: Secondary | ICD-10-CM

## 2016-09-30 DIAGNOSIS — E119 Type 2 diabetes mellitus without complications: Secondary | ICD-10-CM | POA: Diagnosis not present

## 2016-09-30 DIAGNOSIS — Z8639 Personal history of other endocrine, nutritional and metabolic disease: Secondary | ICD-10-CM | POA: Diagnosis not present

## 2016-09-30 MED ORDER — GLUCOSE BLOOD VI STRP: ORAL_STRIP | 3 refills | Status: DC

## 2016-09-30 MED ORDER — DAPAGLIFLOZIN PROPANEDIOL 10 MG PO TABS: 10.0000 mg | ORAL_TABLET | Freq: Every day | ORAL | 1 refills | Status: DC

## 2016-09-30 MED ORDER — VENLAFAXINE HCL ER 37.5 MG PO CP24: 37.5000 mg | ORAL_CAPSULE | Freq: Every day | ORAL | 1 refills | Status: DC

## 2016-09-30 NOTE — Patient Instructions (Signed)
Please schedule an appointment for fasting labs.  I will call you with the results.  Please continue medications as directed for now. Keep up with diet and exercise.  You will be contacted to schedule screening mammogram and [place]. You will also be contacted for an appointment by Dermatology.  If you have not heard from either within 4-5 days, please give [place] a call.

## 2016-09-30 NOTE — Progress Notes (Signed)
Pre visit review using our clinic review tool, if applicable. No additional management support is needed unless otherwise documented below in the visit note/SLS  

## 2016-09-30 NOTE — Progress Notes (Signed)
Patient presents to clinic today for follow-up of DM II controlled without complications. Patient is currently on regimen of Farxiga 10 mg daily and Glipizide 5 mg daily. Endorses taking as directed. Is continuing with well-balanced diet and exercise regimen. Patient currently exercising for an hour 3-4 x week. Also occasional mountain bikes. Is checking sugars as directed. Fasting CBGs averaging 100-110. Is up-to-date on eye exam and diabetic foot exam.  Had flu shot at work.  Patient is overdue for mammogram. Endorses + history of fibrous dense breast tissue. Denies history of cancerous lesion. Denies current concern. Is followed by GYN. Would like repeat imaging.   Past Medical History:  Diagnosis Date  . Colon polyps   . Depression   . Diabetes mellitus   . DVT (deep venous thrombosis) (HCC)    Left Leg post Fx, 1979  . Headache(784.0)    migraines  . Hepatitis    dx in 8th grade-not sure what type, can not donate blood.  Marland Kitchen History of chicken pox    childhood  . History of kidney stones    no surgery required  . Hyperlipidemia   . 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 Rectal bleeding   . SVD (spontaneous vaginal delivery)    x 1  . UTI (lower urinary tract infection)     Current Outpatient Prescriptions on File Prior to Visit  Medication Sig Dispense Refill  . aspirin 81 MG tablet Take 81 mg by mouth daily.    Marland Kitchen atenolol (TENORMIN) 25 MG tablet Take 1 tablet (25 mg total) by mouth daily. 90 tablet 1  . Biotin (BIOTIN 5000) 5 MG CAPS Take 1 capsule by mouth daily.    . calcium gluconate 500 MG tablet Take 500 mg by mouth daily.    . Cinnamon 500 MG capsule Take 500 mg by mouth daily.    . ergocalciferol (VITAMIN D2) 50000 units capsule Take 1 capsule (50,000 Units total) by mouth once a week. 8 capsule 0  . estradiol (ESTRACE) 0.5 MG tablet TAKE 1 TABLET DAILY 90 tablet 0  . glipiZIDE (GLUCOTROL XL) 5 MG 24 hr tablet TAKE 1 TABLET BY MOUTH EVERY DAY WITH BREAKFAST 90 tablet  1  . glucosamine-chondroitin 500-400 MG tablet Take 1 tablet by mouth as directed.    . Lancets (ONETOUCH ULTRASOFT) lancets Use as instructed 100 each 12  . magnesium 30 MG tablet Take 30 mg by mouth as directed.    . Multiple Vitamins-Minerals (MULTIVITAMIN & MINERAL PO) Take by mouth daily.    . SUMAtriptan (IMITREX) 100 MG tablet Take 1 tablet (100 mg total) by mouth once. For migraines 30 tablet 1   No current facility-administered medications on file prior to visit.     Allergies  Allergen Reactions  . Penicillins     As a child - can't remember  . Metformin And Related Nausea And Vomiting    Family History  Problem Relation Age of Onset  . Arthritis/Rheumatoid Father 18    Deceased  . Colon cancer Father   . Hyperlipidemia Mother     Living  . Heart disease Father   . Hypertension Father   . Diabetes Father   . Ovarian cancer Maternal Grandmother   . Glaucoma Mother   . Thrombocytopenia Brother     #1  . Leukemia Brother     #1  . Hyperlipidemia Brother     #2  . Sleep apnea Brother     #2  .  Sudden death Maternal Uncle   . Heart attack Maternal Uncle   . Depression Son   . Sarcoidosis Mother     Social History   Social History  . Marital status: Married    Spouse name: N/A  . Number of children: N/A  . Years of education: N/A   Social History Main Topics  . Smoking status: Former Smoker    Types: Cigarettes  . Smokeless tobacco: Never Used     Comment: Only when teenager for 1.5 years  . Alcohol use 1.8 oz/week    3 Cans of beer per week     Comment: socially  . Drug use: No  . Sexual activity: Yes    Birth control/ protection: None     Comment: hysterectomy   Other Topics Concern  . None   Social History Narrative  . None    Review of Systems - See HPI.  All other ROS are negative.  BP 96/68 (BP Location: Right Arm, Patient Position: Sitting, Cuff Size: Large)   Pulse 63   Temp 97.8 F (36.6 C) (Oral)   Resp 16   Ht 5\' 5"  (1.651  m)   Wt 152 lb 6 oz (69.1 kg)   SpO2 98%   BMI 25.36 kg/m   Physical Exam  Constitutional: She is oriented to person, place, and time and well-developed, well-nourished, and in no distress.  HENT:  Head: Normocephalic and atraumatic.  Neck: Neck supple.  Cardiovascular: Normal rate, regular rhythm, normal heart sounds and intact distal pulses.   Pulmonary/Chest: Effort normal and breath sounds normal. No respiratory distress. She has no wheezes. She has no rales. She exhibits no tenderness.  Lymphadenopathy:    She has no cervical adenopathy.  Neurological: She is alert and oriented to person, place, and time.  Skin: Skin is warm and dry. No rash noted.  Psychiatric: Affect normal.  Vitals reviewed.  Assessment/Plan: Type 2 diabetes mellitus without complication ([facility]) Patient to return for labs. Continue diet and exercise regimen.  Flu shot, Eye exam and foot exam up-to-date. Fasting CBGs at goal. Will alter regimen based on results. Continue current medication regimen for now.  History of vitamin D deficiency Will repeat labs.  Breast cancer screening Giving history of having to have repeat  Mammography and [place] due to dense breast tissue, will order 3D mammogram  for screening purposes.    Leeanne Rio, PA-C

## 2016-10-01 DIAGNOSIS — Z1239 Encounter for other screening for malignant neoplasm of breast: Secondary | ICD-10-CM | POA: Insufficient documentation

## 2016-10-01 NOTE — Assessment & Plan Note (Signed)
Patient to return for labs. Continue diet and exercise regimen.  Flu shot, Eye exam and foot exam up-to-date. Fasting CBGs at goal. Will alter regimen based on results. Continue current medication regimen for now.

## 2016-10-01 NOTE — Assessment & Plan Note (Signed)
Will repeat labs.

## 2016-10-01 NOTE — Assessment & Plan Note (Addendum)
Giving history of having to have repeat  Mammography and [place] due to dense breast tissue, will order 3D mammogram  for screening purposes.

## 2016-10-02 ENCOUNTER — Other Ambulatory Visit (INDEPENDENT_AMBULATORY_CARE_PROVIDER_SITE_OTHER): Payer: 59

## 2016-10-02 DIAGNOSIS — E119 Type 2 diabetes mellitus without complications: Secondary | ICD-10-CM

## 2016-10-02 DIAGNOSIS — Z8639 Personal history of other endocrine, nutritional and metabolic disease: Secondary | ICD-10-CM

## 2016-10-02 LAB — BASIC METABOLIC PANEL
BUN: 8 mg/dL (ref 6–23)
CHLORIDE: 105 meq/L (ref 96–112)
CO2: 31 meq/L (ref 19–32)
Calcium: 9.3 mg/dL (ref 8.4–10.5)
Creatinine, Ser: 0.61 mg/dL (ref 0.40–1.20)
GFR: 105.85 mL/min (ref >60.00)
Glucose, Bld: 137 mg/dL — ABNORMAL HIGH (ref 70–99)
POTASSIUM: 4 meq/L (ref 3.5–5.1)
Sodium: 141 mEq/L (ref 135–145)

## 2016-10-02 LAB — LIPID PANEL
CHOLESTEROL: 242 mg/dL — AB (ref 0–200)
HDL: 68.6 mg/dL (ref >39.00)
LDL CALC: 163 mg/dL — AB (ref 0–99)
NONHDL: 173.62
Total CHOL/HDL Ratio: 4
Triglycerides: 51 mg/dL (ref 0.0–149.0)
VLDL: 10.2 mg/dL (ref 0.0–40.0)

## 2016-10-02 LAB — HEMOGLOBIN A1C: HEMOGLOBIN A1C: 5.3 % (ref 4.6–6.5)

## 2016-10-04 LAB — VITAMIN D 1,25 DIHYDROXY
VITAMIN D2 1, 25 (OH): 17 pg/mL
Vitamin D 1, 25 (OH)2 Total: 38 pg/mL (ref 18–72)
Vitamin D3 1, 25 (OH)2: 21 pg/mL

## 2016-10-08 ENCOUNTER — Other Ambulatory Visit: Payer: Self-pay

## 2016-10-08 MED ORDER — ROSUVASTATIN CALCIUM 10 MG PO TABS: 10.0000 mg | ORAL_TABLET | Freq: Every day | ORAL | 1 refills | Status: DC

## 2016-10-08 NOTE — Progress Notes (Signed)
Crestor sent to [facility] per patient request.

## 2016-10-14 ENCOUNTER — Other Ambulatory Visit: Payer: Self-pay | Admitting: Physician Assistant

## 2016-10-14 ENCOUNTER — Telehealth: Payer: Self-pay | Admitting: Physician Assistant

## 2016-10-14 DIAGNOSIS — L989 Disorder of the skin and subcutaneous tissue, unspecified: Secondary | ICD-10-CM

## 2016-10-14 NOTE — Telephone Encounter (Signed)
Referral has been placed.  Please call to assess sugar readings. Expected them to rise slightly off of the Glucotrol. We will see if we need to make any adjustments but I need more information

## 2016-10-14 NOTE — Telephone Encounter (Signed)
Want to try and keep her off of sulfonylureas like the Glucotrol she was taking before.   Can consider changing the [place] to Merleen Nicely which has the same medication as the [place] with a branded Metformin added in. This branded metformin typically does not cause the nausea/vomiting side effect of the regular generic metformin, so may be a great option.   Our goal is to keep fasting sugars 90-110 if possible. If we started the new medication and sugars began getting < 80 we may need to reassess. Please let me know her thoughts and we will get the ball rolling.

## 2016-10-14 NOTE — Telephone Encounter (Signed)
Relation to [id number] Call back number:[phone]   Reason for call:  Patient last seen 09/30/16 checking on the status of dermatlogist referral, chart doesn't reflect referral, please advise. Patient states since she stopped taking glipiZIDE (GLUCOTROL XL) 5 MG 24 hr tablet her blood sugar increased, please advise

## 2016-10-14 NOTE — Telephone Encounter (Signed)
Patient states that her CBGs have increased by 60 pts since discontinuance of Glipizide/SLS 11/08

## 2016-10-15 NOTE — Telephone Encounter (Signed)
I spoke with the patient and she is willing to try the branded Metformin and she states that she will keep a record of her blood sugars. She wanted to know if you were going to send in a 1 month supply of the medicine and when would she need to follow up.   Please advise.

## 2016-10-16 MED ORDER — DAPAGLIFLOZIN PRO-METFORMIN ER 10-500 MG PO TB24: 1.0000 | ORAL_TABLET | Freq: Every day | ORAL | 3 refills | Status: DC

## 2016-10-16 NOTE — Addendum Note (Signed)
Addended by: Raiford Noble on: 10/16/2016 06:43 AM   Modules accepted: Orders

## 2016-10-16 NOTE — Telephone Encounter (Signed)
I have sent in script for the Xigduo 10-500 mg to take once daily. She will take this instead of the [place]. We have vouchers in the office to make the medication affordable if insurance does not pay the same as for the Wilder Glade (they should).   Continue checking fasting sugars. Continue diet and exercise. Follow-up 1 month.  If sugars getting < 80, she needs to stop medication and call the office.

## 2016-10-16 NOTE — Telephone Encounter (Addendum)
I left a message for the patient to call back about the message below from [facility] with her instructions. The patient will need to schedule a follow up appointment.

## 2016-10-19 NOTE — Telephone Encounter (Signed)
LMOM with contact name and number for return call RE: Medication management and further provider instructions/SLS 11/13

## 2016-10-19 NOTE — Telephone Encounter (Signed)
Patient returned call

## 2016-10-20 NOTE — Telephone Encounter (Signed)
Patient informed, understood & agreed/SLS 11/14

## 2016-12-02 ENCOUNTER — Other Ambulatory Visit: Payer: Self-pay | Admitting: Physician Assistant

## 2016-12-07 HISTORY — PX: KNEE CARTILAGE SURGERY: SHX688

## 2017-01-08 ENCOUNTER — Other Ambulatory Visit: Payer: Self-pay | Admitting: Emergency Medicine

## 2017-01-08 ENCOUNTER — Encounter: Payer: Self-pay | Admitting: Physician Assistant

## 2017-01-08 MED ORDER — ATENOLOL 25 MG PO TABS: 25.0000 mg | ORAL_TABLET | Freq: Every day | ORAL | 0 refills | Status: DC

## 2017-01-28 ENCOUNTER — Ambulatory Visit: Payer: 59

## 2017-02-19 ENCOUNTER — Ambulatory Visit (INDEPENDENT_AMBULATORY_CARE_PROVIDER_SITE_OTHER): Payer: 59 | Admitting: Internal Medicine

## 2017-02-19 ENCOUNTER — Telehealth: Payer: Self-pay | Admitting: Physician Assistant

## 2017-02-19 ENCOUNTER — Encounter: Payer: Self-pay | Admitting: Internal Medicine

## 2017-02-19 VITALS — BP 112/76 | HR 76 | Temp 97.6°F | Resp 14 | Ht 65.0 in | Wt 171.0 lb

## 2017-02-19 DIAGNOSIS — R002 Palpitations: Secondary | ICD-10-CM | POA: Diagnosis not present

## 2017-02-19 MED ORDER — ATENOLOL 25 MG PO TABS: 25.0000 mg | ORAL_TABLET | Freq: Every day | ORAL | 0 refills | Status: DC

## 2017-02-19 NOTE — Telephone Encounter (Signed)
ok 

## 2017-02-19 NOTE — Telephone Encounter (Signed)
[facility] - Client [facility] Patient Name: Laurie Hernandez DOB: 1956/03/16 Initial Comment Caller States the patient is having rapid heart rate Nurse Assessment Nurse: Dimas Chyle, RN, Dellis Filbert Date/Time (Eastern Time): 02/19/2017 8:56:33 AM Confirm and document reason for call. If symptomatic, describe symptoms. ---Caller states the patient is having rapid heart rate. Having symptoms for 3 days. Has ran out atenolol. Heart rate has been as high as 120. Does the patient have any new or worsening symptoms? ---Yes Will a triage be completed? ---Yes Related visit to physician within the last 2 weeks? ---No Does the PT have any chronic conditions? (i.e. diabetes, asthma, etc.) ---Yes List chronic conditions. ---Hx of angina and migraine Is this a behavioral health or substance abuse call? ---No Guidelines Guideline Title Affirmed Question Affirmed Notes Heart Rate and Heartbeat Questions Age > 60 years (Exception: brief heart beat symptoms that went away and now feels well) Final Disposition User See Physician within 4 Hours (or PCP triage) Dimas Chyle, RN, Dellis Filbert Referrals REFERRED TO PCP OFFICE Disagree/Comply: Leta Baptist

## 2017-02-19 NOTE — Telephone Encounter (Signed)
Ok with me 

## 2017-02-19 NOTE — Patient Instructions (Signed)
Go back on atenolol  Call if you have more symptoms  ER if severe symptoms or you have chest pain, difficulty breathing, dizziness. Please see your primary within the next 4 weeks.

## 2017-02-19 NOTE — Telephone Encounter (Signed)
Patient spoke with [facility] and is being seen today by Dr. Larose Kells for the rapid heart rate.

## 2017-02-19 NOTE — Progress Notes (Signed)
Subjective:    Patient ID: Laurie Hernandez, female    DOB: 1956-08-06, 61 y.o.   MRN: [id number]  DOS:  02/19/2017 Type of visit - description : acute Interval history: Yesterday, was sitting at her desk working, at home, and she developed palpitations described as a very fast heart rate. Symptoms lasted several hours during the day, she monitor her pulse and it was a high of 126. 2-3 days ago she ran out of atenolol. She went to sleep, had no symptoms throughout the night. Woke up this morning, feeling well, she has been monitoring her heart rate and he was in the 90s early this morning. She usually runs in the 60s.   Review of Systems no chest pain, difficulty breathing, near syncope feeling. No lower extremity edema. No unusual stress No recent airplane trip or prolonged car trip. No calf pain or swelling. She had a migraine headache yesterday, mild to moderate, etc. and felt better. Denies taking any decongestant  Past Medical History:  Diagnosis Date  . Colon polyps   . Depression   . Diabetes mellitus   . DVT (deep venous thrombosis) (HCC)    Left Leg post Fx, 1979  . Headache(784.0)    migraines  . Hepatitis    dx in 8th grade-not sure what type, can not donate blood.  Marland Kitchen History of chicken pox    childhood  . History of kidney stones    no surgery required  . Hyperlipidemia   . 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 Rectal bleeding   . SVD (spontaneous vaginal delivery)    x 1  . UTI (lower urinary tract infection)     Past Surgical History:  Procedure Laterality Date  . ABDOMINAL HYSTERECTOMY  2000  . ANTERIOR AND POSTERIOR REPAIR  09/13/2012   Procedure: ANTERIOR (CYSTOCELE) AND POSTERIOR REPAIR (RECTOCELE);  Surgeon: Gus Height, MD;  Location: [facility];  Service: Gynecology;  Laterality: N/A;  . BREAST SURGERY  1994   Left lumpectomy x 3 -  [facility]  . COLONOSCOPY  08/2012   at [facility]  . TONSILLECTOMY    . WISDOM TOOTH EXTRACTION      Social History   Social  History  . Marital status: Married    Spouse name: N/A  . Number of children: N/A  . Years of education: N/A   Occupational History  . Not on file.   Social History Main Topics  . Smoking status: Former Smoker    Types: Cigarettes  . Smokeless tobacco: Never Used     Comment: Only when teenager for 1.5 years  . Alcohol use 1.8 oz/week    3 Cans of beer per week     Comment: socially  . Drug use: No  . Sexual activity: Yes    Birth control/ protection: None     Comment: hysterectomy   Other Topics Concern  . Not on file   Social History Narrative  . No narrative on file      Allergies as of 02/19/2017      Reactions   Penicillins    As a child - can't remember   Metformin And Related Nausea And Vomiting      Medication List       Accurate as of 02/19/17 11:59 PM. Always use your most recent med list.          aspirin 81 MG tablet Take 81 mg by mouth daily.   atenolol 25 MG tablet Commonly known  as:  TENORMIN Take 1 tablet (25 mg total) by mouth daily.   BIOTIN 5000 5 MG Caps Generic drug:  Biotin Take 1 capsule by mouth daily.   calcium gluconate 500 MG tablet Take 500 mg by mouth daily.   Cinnamon 500 MG capsule Take 500 mg by mouth daily.   Dapagliflozin-Metformin HCl ER 10-500 MG Tb24 Commonly known as:  XIGDUO XR Take 1 tablet by mouth daily.   ergocalciferol 50000 units capsule Commonly known as:  VITAMIN D2 Take 1 capsule (50,000 Units total) by mouth once a week.   estradiol 0.5 MG tablet Commonly known as:  ESTRACE TAKE 1 TABLET DAILY   glucosamine-chondroitin 500-400 MG tablet Take 1 tablet by mouth as directed.   glucose blood test strip Use as instructed to test blood glucose daily. Dx: E11.9 OneTouch Verio   magnesium 30 MG tablet Take 30 mg by mouth as directed.   MULTIVITAMIN & MINERAL PO Take by mouth daily.   onetouch ultrasoft lancets Use as instructed   rosuvastatin 10 MG tablet Commonly known as:  CRESTOR Take  1 tablet (10 mg total) by mouth daily.   SUMAtriptan 100 MG tablet Commonly known as:  IMITREX Take 1 tablet (100 mg total) by mouth once. For migraines   venlafaxine XR 37.5 MG 24 hr capsule Commonly known as:  EFFEXOR-XR Take 1 capsule (37.5 mg total) by mouth daily with breakfast.          Objective:   Physical Exam BP 112/76 (BP Location: Left Arm, Patient Position: Sitting, Cuff Size: Normal)   Pulse 76   Temp 97.6 F (36.4 C) (Oral)   Resp 14   Ht 5\' 5"  (1.651 m)   Wt 171 lb (77.6 kg) Comment: Per Pt  SpO2 97%   BMI 28.46 kg/m  General:   Well developed, well nourished . NAD.  neck: No thyromegaly HEENT:  Normocephalic . Face symmetric, atraumatic Lungs:  CTA B Normal respiratory effort, no intercostal retractions, no accessory muscle use. Heart: RRR,  no murmur.  No pretibial edema bilaterally  Skin: Not pale. Not jaundice Neurologic:  alert & oriented X3.  Speech normal, gait appropriate for age and unassisted Psych--  Cognition and judgment appear intact.  Cooperative with normal attention span and concentration.  Behavior appropriate. No anxious or depressed appearing.      Assessment & Plan:   61 year old lady with history of Diabetes, remote DVT, kidney stones, hyperlipidemia, depression, migraine headaches, on HRT, presents with the following concerns:  Palpitations: Her several hours yesterday, no red flag symptoms, in the context of not taking chronic beta blockers for 2 or 3 days. No history of anemia or thyroid disease. EKG today-- sinus rhythm, rate 75 Physical exam is essentially normal. Plan: Refill beta blockers, definitely call if symptoms resurface. ER if symptoms severe or associated with red flag symptoms which were discussed with the patient. Recommend to see PCP for routine checkup.

## 2017-02-19 NOTE — Telephone Encounter (Signed)
Patient request to transfer care from Elyn Aquas to Dr Lorelei Pont because of distance

## 2017-02-19 NOTE — Progress Notes (Signed)
Pre visit review using our clinic review tool, if applicable. No additional management support is needed unless otherwise documented below in the visit note. 

## 2017-02-19 NOTE — Telephone Encounter (Signed)
Patient called stating she is experiencing rapid heart rates. States using a pulse ox at work her HR is ranging between 90 and 110 resting. Transferred to [facility].

## 2017-03-02 ENCOUNTER — Other Ambulatory Visit: Payer: Self-pay | Admitting: Physician Assistant

## 2017-03-03 ENCOUNTER — Encounter: Payer: Self-pay | Admitting: Emergency Medicine

## 2017-03-04 ENCOUNTER — Encounter: Payer: Self-pay | Admitting: Emergency Medicine

## 2017-03-09 ENCOUNTER — Ambulatory Visit
Admission: RE | Admit: 2017-03-09 | Discharge: 2017-03-09 | Disposition: A | Discharge status: Home / Self Care | Payer: 59 | Source: Ambulatory Visit | Attending: Physician Assistant | Admitting: Physician Assistant

## 2017-03-09 DIAGNOSIS — Z1231 Encounter for screening mammogram for malignant neoplasm of breast: Secondary | ICD-10-CM | POA: Diagnosis not present

## 2017-03-09 DIAGNOSIS — Z1239 Encounter for other screening for malignant neoplasm of breast: Secondary | ICD-10-CM

## 2017-04-06 ENCOUNTER — Telehealth: Payer: Self-pay | Admitting: Behavioral Health

## 2017-04-06 NOTE — Telephone Encounter (Signed)
Patient voiced that she's in the middle of a meeting right now & unable to complete Pre-Visit Info. She will update all info at the appointment tomorrow on 04/07/17.

## 2017-04-07 ENCOUNTER — Ambulatory Visit (INDEPENDENT_AMBULATORY_CARE_PROVIDER_SITE_OTHER): Payer: 59 | Admitting: Family Medicine

## 2017-04-07 VITALS — BP 106/72 | HR 72 | Temp 97.7°F | Ht 64.5 in

## 2017-04-07 DIAGNOSIS — E559 Vitamin D deficiency, unspecified: Secondary | ICD-10-CM | POA: Diagnosis not present

## 2017-04-07 DIAGNOSIS — E119 Type 2 diabetes mellitus without complications: Secondary | ICD-10-CM | POA: Diagnosis not present

## 2017-04-07 DIAGNOSIS — R635 Abnormal weight gain: Secondary | ICD-10-CM

## 2017-04-07 DIAGNOSIS — E785 Hyperlipidemia, unspecified: Secondary | ICD-10-CM

## 2017-04-07 LAB — COMPREHENSIVE METABOLIC PANEL
ALBUMIN: 4.4 g/dL (ref 3.5–5.2)
ALK PHOS: 71 U/L (ref 39–117)
ALT: 19 U/L (ref 0–35)
AST: 21 U/L (ref 0–37)
BILIRUBIN TOTAL: 0.6 mg/dL (ref 0.2–1.2)
BUN: 12 mg/dL (ref 6–23)
CO2: 28 meq/L (ref 19–32)
CREATININE: 0.67 mg/dL (ref 0.40–1.20)
Calcium: 9.5 mg/dL (ref 8.4–10.5)
Chloride: 101 mEq/L (ref 96–112)
GFR: 94.83 mL/min (ref >60.00)
Glucose, Bld: 210 mg/dL — ABNORMAL HIGH (ref 70–99)
Potassium: 3.9 mEq/L (ref 3.5–5.1)
Sodium: 138 mEq/L (ref 135–145)
TOTAL PROTEIN: 7.4 g/dL (ref 6.0–8.3)

## 2017-04-07 LAB — LIPID PANEL
CHOL/HDL RATIO: 3
CHOLESTEROL: 274 mg/dL — AB (ref 0–200)
HDL: 97.7 mg/dL (ref >39.00)
LDL Cholesterol: 155 mg/dL — ABNORMAL HIGH (ref 0–99)
NonHDL: 176.34
TRIGLYCERIDES: 107 mg/dL (ref 0.0–149.0)
VLDL: 21.4 mg/dL (ref 0.0–40.0)

## 2017-04-07 LAB — TSH: TSH: 1.88 u[IU]/mL (ref 0.35–4.50)

## 2017-04-07 LAB — VITAMIN D 25 HYDROXY (VIT D DEFICIENCY, FRACTURES): VITD: 18.07 ng/mL — ABNORMAL LOW (ref 30.00–100.00)

## 2017-04-07 LAB — HEMOGLOBIN A1C: HEMOGLOBIN A1C: 6.8 % — AB (ref 4.6–6.5)

## 2017-04-07 MED ORDER — DAPAGLIFLOZIN PROPANEDIOL 10 MG PO TABS: 10.0000 mg | ORAL_TABLET | Freq: Every day | ORAL | 3 refills | Status: DC

## 2017-04-07 MED ORDER — GLIPIZIDE ER 5 MG PO TB24: 5.0000 mg | ORAL_TABLET | Freq: Every day | ORAL | 3 refills | Status: DC

## 2017-04-07 MED ORDER — ROSUVASTATIN CALCIUM 10 MG PO TABS
10.0000 mg | ORAL_TABLET | Freq: Every day | ORAL | 3 refills | Status: DC
Start: 2017-04-07 — End: 2018-04-26

## 2017-04-07 NOTE — Progress Notes (Addendum)
[place] at [facility] [address], [place], [place] [address] [phone]  Date:  04/07/2017   Name:  Laurie Hernandez   DOB:  1956/11/08   MRN:  [id number]  PCP:  Leeanne Rio, PA-C    Chief Complaint: Transfer of Care (Former pt of [place]. Would like to discuss weight gain. Pt states that she has gained 25lbs in the past 6 months. )   History of Present Illness:  Laurie Hernandez is a 61 y.o. very pleasant female patient who presents with the following:  Former pt of [facility] who has moved to a new location History of DM- needs to follow-up on this today Also seen 2 months ago by Dr. Larose Kells for palpitations- she had run out of her BB at the time. She is back on her BB and palpitations are resolved.  Most recent A1c as below  She has noted weight gain- up about 25 lbs over the last 6 months.  Admits that she is eating more and not exercising like she used to [facility] Readings from Last 3 Encounters:  02/19/17 171 lb (77.6 kg)  09/30/16 152 lb 6 oz (69.1 kg)  06/23/16 160 lb 4 oz (72.7 kg)   Her grown son moved back home which has been stressful for her.    She loves him and states that he is always welcome, but she worried that he lost his job and it is just a big change having him back at home Her husband and son follow a ketogenic diet; "guess who ate all the carbs."   She has gotten back onto her mountain bike recently- this is her preferred form of exercise- and hopes that she will soon shed the weight she gained  She had been on farxiga/ glipizide. They changed her to metfomrin/ farxiga due known as xigduo but she is not taking this any longer due to SE of metfomrin,  She would prefer to go back on glipizide if possible Her glucose was high the last couple of days- She is just taking farxiaga 10 at his time She is out of her crestor- out of this for about 3 months  She has a history of vitamin D deficiency which  was treated with high dose supplement last year- she is not sure if she needs to be taking any supplement now  She is fasting today for labs  She is on effexor and does feel like this is working for her, does not feel that she is depressed   Lab Results  Component Value Date   TSH 2.11 01/09/2015    Lab Results  Component Value Date   HGBA1C 5.3 10/02/2016     Patient Active Problem List   Diagnosis Date Noted  . History of vitamin D deficiency 06/27/2016  . History of kidney stones 11/30/2015  . Depression 01/20/2015  . Migraine without aura and without status migrainosus, not intractable 01/20/2015  . Type 2 diabetes mellitus without complication ([facility] [facility]) 30/16/0109    Past Medical History:  Diagnosis Date  . Colon polyps   . Depression   . Diabetes mellitus   . DVT (deep venous thrombosis) (HCC)    Left Leg post Fx, 1979  . Headache(784.0)    migraines  . Hepatitis    dx in 8th grade-not sure what type, can not donate blood.  Marland Kitchen History of chicken pox    childhood  . History of kidney  stones    no surgery required  . Hyperlipidemia   . 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 Rectal bleeding   . SVD (spontaneous vaginal delivery)    x 1  . UTI (lower urinary tract infection)     Past Surgical History:  Procedure Laterality Date  . ABDOMINAL HYSTERECTOMY  2000  . ANTERIOR AND POSTERIOR REPAIR  09/13/2012   Procedure: ANTERIOR (CYSTOCELE) AND POSTERIOR REPAIR (RECTOCELE);  Surgeon: Gus Height, MD;  Location: [facility];  Service: Gynecology;  Laterality: N/A;  . BREAST SURGERY  1994   Left lumpectomy x 3 -  [facility]  . [facility]   [facility]  . TONSILLECTOMY    . WISDOM TOOTH EXTRACTION      Social History  Substance Use Topics  . Smoking status: Former Smoker    Types: Cigarettes  . Smokeless tobacco: Never Used     Comment: Only when teenager for 1.5 years  . Alcohol use 1.8 oz/week    3 Cans of beer per week     Comment: socially    Family History   Problem Relation Age of Onset  . Arthritis/Rheumatoid Father 16    Deceased  . Colon cancer Father   . Heart disease Father   . Hypertension Father   . Diabetes Father   . Hyperlipidemia Mother     Living  . Glaucoma Mother   . Sarcoidosis Mother   . Ovarian cancer Maternal Grandmother   . Breast cancer Maternal Grandmother   . Thrombocytopenia Brother     #1  . Leukemia Brother     #1  . Hyperlipidemia Brother     #2  . Sleep apnea Brother     #2  . Sudden death Maternal Uncle   . Heart attack Maternal Uncle   . Depression Son     Allergies  Allergen Reactions  . Penicillins     As a child - can't remember  . Metformin And Related Nausea And Vomiting    Medication list has been reviewed and updated.  Current Outpatient Prescriptions on File Prior to Visit  Medication Sig Dispense Refill  . aspirin 81 MG tablet Take 81 mg by mouth daily.    Marland Kitchen atenolol (TENORMIN) 25 MG tablet Take 1 tablet (25 mg total) by mouth daily. 90 tablet 0  . estradiol (ESTRACE) 0.5 MG tablet TAKE 1 TABLET DAILY 90 tablet 0  . glucose blood test strip Use as instructed to test blood glucose daily. Dx: E11.9 OneTouch Verio 150 each 3  . Lancets (ONETOUCH ULTRASOFT) lancets Use as instructed 100 each 12  . SUMAtriptan (IMITREX) 100 MG tablet Take 1 tablet (100 mg total) by mouth once. For migraines 30 tablet 1  . venlafaxine XR (EFFEXOR-XR) 37.5 MG 24 hr capsule Take 1 capsule (37.5 mg total) by mouth daily with breakfast. 90 capsule 1   No current facility-administered medications on file prior to visit.     Review of Systems:  As per HPI- otherwise negative. No CP, no SOB, no fever or chills  Physical Examination: Vitals:   04/07/17 1103  BP: 106/72  Pulse: 72  Temp: 97.7 F (36.5 C)   Vitals:   04/07/17 1103  Height: 5' 4.5" (1.638 m)   There is no height or weight on file to calculate BMI. Ideal Body Weight: Weight in (lb) to have BMI = 25: 147.6  GEN: WDWN, NAD,  Non-toxic, A & O x 3 HEENT: Atraumatic, Normocephalic. Neck supple.  No masses, No LAD. Ears and Nose: No external deformity. CV: RRR, No M/G/R. No JVD. No thrill. No extra heart sounds. PULM: CTA B, no wheezes, crackles, rhonchi. No retractions. No resp. distress. No accessory muscle use. EXTR: No c/c/e NEURO Normal gait.  PSYCH: Normally interactive. Conversant. Not depressed or anxious appearing.  Calm demeanor.  Overweight, otherwise looks well.  Pt declined to be weighed today  Assessment and Plan: Controlled type 2 diabetes mellitus without complication, without long-term current use of insulin (HCC) - Plan: Hemoglobin A1c, Comprehensive metabolic panel, dapagliflozin propanediol (FARXIGA) 10 MG TABS tablet, glipiZIDE (GLUCOTROL XL) 5 MG 24 hr tablet  Vitamin D deficiency - Plan: Vitamin D (25 hydroxy)  Dyslipidemia - Plan: Lipid panel, rosuvastatin (CRESTOR) 10 MG tablet  Weight gain - Plan: TSH  Follow-up visit today She does not tolerate metformin- will change her back to farxiga and glipizide A1c today, CMP, lipids, TSH Will plan further follow- up pending labs.  She declines a pneumonia vaccine today- thinks that her tetanus is UTD but will check on this with her job   Signed Lamar Blinks, MD  Results for orders placed or performed in visit on 04/07/17  Hemoglobin A1c  Result Value Ref Range   Hgb A1c MFr Bld 6.8 (H) 4.6 - 6.5 %  TSH  Result Value Ref Range   TSH 1.88 0.35 - 4.50 uIU/mL  Lipid panel  Result Value Ref Range   Cholesterol 274 (H) 0 - 200 mg/dL   Triglycerides 107.0 0.0 - 149.0 mg/dL   HDL 97.70 >39.00 mg/dL   VLDL 21.4 0.0 - 40.0 mg/dL   LDL Cholesterol 155 (H) 0 - 99 mg/dL   Total CHOL/HDL Ratio 3    NonHDL 176.34   Comprehensive metabolic panel  Result Value Ref Range   Sodium 138 135 - 145 mEq/L   Potassium 3.9 3.5 - 5.1 mEq/L   Chloride 101 96 - 112 mEq/L   CO2 28 19 - 32 mEq/L   Glucose, Bld 210 (H) 70 - 99 mg/dL   BUN 12 6 - 23  mg/dL   Creatinine, Ser 0.67 0.40 - 1.20 mg/dL   Total Bilirubin 0.6 0.2 - 1.2 mg/dL   Alkaline Phosphatase 71 39 - 117 U/L   AST 21 0 - 37 U/L   ALT 19 0 - 35 U/L   Total Protein 7.4 6.0 - 8.3 g/dL   Albumin 4.4 3.5 - 5.2 g/dL   Calcium 9.5 8.4 - 10.5 mg/dL   GFR 94.83 >60.00 mL/min  Vitamin D (25 hydroxy)  Result Value Ref Range   VITD 18.07 (L) 30.00 - 100.00 ng/mL   Message to pt- will retreat her vitamin D def Her A1c is ok We restarted her crestor at this visit

## 2017-04-07 NOTE — Patient Instructions (Signed)
It was very nice to see you today- have a wonderful time in [place]!  I will be in touch with your labs asap and we will plan your next visit  We will change you back to Farxiga/glipizide I refilled your crestor; if you prefer to see your cholesterol numbers prior to filling this that is fine Please see if you can find the date of your most recent tetanus shot from your employee health records  Take care!

## 2017-04-08 ENCOUNTER — Encounter: Payer: Self-pay | Admitting: Family Medicine

## 2017-04-08 MED ORDER — VITAMIN D (ERGOCALCIFEROL) 1.25 MG (50000 UNIT) PO CAPS: 50000.0000 [IU] | ORAL_CAPSULE | ORAL | 0 refills | Status: DC

## 2017-04-08 NOTE — Addendum Note (Signed)
Addended by: Lamar Blinks C on: 04/08/2017 05:48 PM   Modules accepted: Orders

## 2017-05-17 ENCOUNTER — Other Ambulatory Visit: Payer: Self-pay | Admitting: Internal Medicine

## 2017-05-31 ENCOUNTER — Other Ambulatory Visit: Payer: Self-pay

## 2017-05-31 ENCOUNTER — Encounter: Payer: Self-pay | Admitting: Family Medicine

## 2017-05-31 DIAGNOSIS — G43009 Migraine without aura, not intractable, without status migrainosus: Secondary | ICD-10-CM

## 2017-05-31 DIAGNOSIS — E119 Type 2 diabetes mellitus without complications: Secondary | ICD-10-CM | POA: Diagnosis not present

## 2017-05-31 LAB — HM DIABETES EYE EXAM

## 2017-05-31 MED ORDER — SUMATRIPTAN SUCCINATE 100 MG PO TABS: 100.0000 mg | ORAL_TABLET | Freq: Every day | ORAL | 1 refills | Status: DC | PRN

## 2017-05-31 MED ORDER — VENLAFAXINE HCL ER 37.5 MG PO CP24: 37.5000 mg | ORAL_CAPSULE | Freq: Every day | ORAL | 1 refills | Status: DC

## 2017-06-03 ENCOUNTER — Other Ambulatory Visit: Payer: Self-pay | Admitting: Physician Assistant

## 2017-06-08 ENCOUNTER — Ambulatory Visit (INDEPENDENT_AMBULATORY_CARE_PROVIDER_SITE_OTHER): Payer: 59 | Admitting: Internal Medicine

## 2017-06-08 ENCOUNTER — Encounter: Payer: Self-pay | Admitting: Internal Medicine

## 2017-06-08 VITALS — BP 124/78 | HR 75 | Temp 97.5°F | Resp 14 | Ht 64.5 in

## 2017-06-08 DIAGNOSIS — M25562 Pain in left knee: Secondary | ICD-10-CM | POA: Diagnosis not present

## 2017-06-08 NOTE — Progress Notes (Signed)
Pre visit review using our clinic review tool, if applicable. No additional management support is needed unless otherwise documented below in the visit note. 

## 2017-06-08 NOTE — Progress Notes (Signed)
Subjective:    Patient ID: Laurie Hernandez, female    DOB: Oct 19, 1956, 61 y.o.   MRN: [id number]  DOS:  06/08/2017 Type of visit - description : acute Interval history: Yesterday, she was sitting in the floor and kneeling on and off throughout the day, he stood up and immediately developed pain at the left knee, described as  "internal pain", feels like her knee is not stable but denies locking. 2 weeks ago she had a couple falls when she was visiting [place], during one episode she landed on her knee but there was no major pain or injury.    Review of Systems  Denies calf pain or swelling.  Past Medical History:  Diagnosis Date  . Colon polyps   . Depression   . Diabetes mellitus   . DVT (deep venous thrombosis) (HCC)    Left Leg post Fx, 1979  . Headache(784.0)    migraines  . Hepatitis    dx in 8th grade-not sure what type, can not donate blood.  Marland Kitchen History of chicken pox    childhood  . History of kidney stones    no surgery required  . Hyperlipidemia   . 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 Rectal bleeding   . SVD (spontaneous vaginal delivery)    x 1  . UTI (lower urinary tract infection)     Past Surgical History:  Procedure Laterality Date  . ABDOMINAL HYSTERECTOMY  2000  . ANTERIOR AND POSTERIOR REPAIR  09/13/2012   Procedure: ANTERIOR (CYSTOCELE) AND POSTERIOR REPAIR (RECTOCELE);  Surgeon: Gus Height, MD;  Location: [facility];  Service: Gynecology;  Laterality: N/A;  . BREAST SURGERY  1994   Left lumpectomy x 3 -  [facility]  . [facility]   [facility]  . TONSILLECTOMY    . WISDOM TOOTH EXTRACTION      Social History   Social History  . Marital status: Married    Spouse name: N/A  . Number of children: N/A  . Years of education: N/A   Occupational History  . Not on file.   Social History Main Topics  . Smoking status: Former Smoker    Types: Cigarettes  . Smokeless tobacco: Never Used     Comment: Only when teenager for 1.5 years  . Alcohol use  1.8 oz/week    3 Cans of beer per week     Comment: socially  . Drug use: No  . Sexual activity: Yes    Birth control/ protection: None     Comment: hysterectomy   Other Topics Concern  . Not on file   Social History Narrative  . No narrative on file      Allergies as of 06/08/2017      Reactions   Penicillins    As a child - can't remember   Metformin And Related Nausea And Vomiting      Medication List       Accurate as of 06/08/17 11:59 PM. Always use your most recent med list.          aspirin 81 MG tablet Take 81 mg by mouth daily.   atenolol 25 MG tablet Commonly known as:  TENORMIN TAKE 1 TABLET BY MOUTH DAILY   dapagliflozin propanediol 10 MG Tabs tablet Commonly known as:  FARXIGA Take 10 mg by mouth daily.   estradiol 0.5 MG tablet Commonly known as:  ESTRACE Take 1 tablet daily   glipiZIDE 5 MG 24 hr tablet Commonly  known as:  GLUCOTROL XL Take 1 tablet (5 mg total) by mouth daily with breakfast.   glucose blood test strip Use as instructed to test blood glucose daily. Dx: E11.9 OneTouch Verio   onetouch ultrasoft lancets Use as instructed   rosuvastatin 10 MG tablet Commonly known as:  CRESTOR Take 1 tablet (10 mg total) by mouth daily.   SUMAtriptan 100 MG tablet Commonly known as:  IMITREX Take 1 tablet (100 mg total) by mouth daily as needed for migraine.   venlafaxine XR 37.5 MG 24 hr capsule Commonly known as:  EFFEXOR-XR Take 1 capsule (37.5 mg total) by mouth daily with breakfast.   Vitamin D (Ergocalciferol) 50000 units Caps capsule Commonly known as:  DRISDOL Take 1 capsule (50,000 Units total) by mouth every 7 (seven) days.          Objective:   Physical Exam BP 124/78 (BP Location: Left Arm, Patient Position: Sitting, Cuff Size: Normal)   Pulse 75   Temp (!) 97.5 F (36.4 C) (Oral)   Resp 14   Ht 5' 4.5" (1.638 m)   SpO2 95%  General:   Well developed, well nourished . NAD.  HEENT:  Normocephalic . Face  symmetric, atraumatic MSK: Sitting in a wheelchair, needs help transferring to the table, has a hard  time putting weight on her left leg  Right knee normal Left knee: Very mild effusion, slightly warm but not red. ROM: unable to fully flex it. + TTP at the medial aspect. + Pain with lateralization. Calves: Symmetric, circumference 17 inches bilaterally, soft, nontender. Skin: Not pale. Not jaundice Neurologic:  alert & oriented X3.  Speech normal, gait appropriate for age and unassisted Psych--  Cognition and judgment appear intact.  Cooperative with normal attention span and concentration.  Behavior appropriate. No anxious or depressed appearing.      Assessment & Plan:   61 year old female with history of DVT left leg after a fracture 1979, on HRT, diabetes, depression, migraines, high cholesterol, presents with the following: Left knee pain: Acutely, started yesterday, suspect intra-articular derangement. Recommend to go to the orthopedic urgent care. Information provided. States she will go. History of DVT: Patient aware she is high risk for  DVTs, currently with no evidence of such, recommend to continue aspirin, isometric exercises and call if pain or swelling.

## 2017-06-08 NOTE — Patient Instructions (Addendum)
Please go to the urgent ; they're open from 5:30 PM to 8:30 PM. Laurie Hernandez [place] in [place]  You are at high risk for clots: Continue taking aspirin, try to move your left leg as much as possible. Call or go to the ER if you develop pain or swelling at the calf.

## 2017-06-15 DIAGNOSIS — H40053 Ocular hypertension, bilateral: Secondary | ICD-10-CM | POA: Diagnosis not present

## 2017-06-16 DIAGNOSIS — M238X2 Other internal derangements of left knee: Secondary | ICD-10-CM | POA: Diagnosis not present

## 2017-06-16 DIAGNOSIS — M25562 Pain in left knee: Secondary | ICD-10-CM | POA: Diagnosis not present

## 2017-06-24 DIAGNOSIS — M25562 Pain in left knee: Secondary | ICD-10-CM | POA: Diagnosis not present

## 2017-07-01 ENCOUNTER — Other Ambulatory Visit: Payer: Self-pay | Admitting: Family Medicine

## 2017-07-01 DIAGNOSIS — E559 Vitamin D deficiency, unspecified: Secondary | ICD-10-CM

## 2017-07-05 ENCOUNTER — Encounter: Payer: Self-pay | Admitting: Family Medicine

## 2017-07-05 DIAGNOSIS — S83282A Other tear of lateral meniscus, current injury, left knee, initial encounter: Secondary | ICD-10-CM | POA: Diagnosis not present

## 2017-07-05 DIAGNOSIS — S83242A Other tear of medial meniscus, current injury, left knee, initial encounter: Secondary | ICD-10-CM | POA: Diagnosis not present

## 2017-07-05 DIAGNOSIS — M1712 Unilateral primary osteoarthritis, left knee: Secondary | ICD-10-CM | POA: Diagnosis not present

## 2017-07-05 NOTE — Telephone Encounter (Signed)
Rx denied.  Pt no longer needs this prescription per Dr. Kermit Balo MyChart message on today.//AB/CMA

## 2017-07-14 DIAGNOSIS — S83282D Other tear of lateral meniscus, current injury, left knee, subsequent encounter: Secondary | ICD-10-CM | POA: Diagnosis not present

## 2017-07-14 DIAGNOSIS — M1712 Unilateral primary osteoarthritis, left knee: Secondary | ICD-10-CM | POA: Diagnosis not present

## 2017-08-05 DIAGNOSIS — S83242A Other tear of medial meniscus, current injury, left knee, initial encounter: Secondary | ICD-10-CM | POA: Diagnosis not present

## 2017-08-05 DIAGNOSIS — S83232A Complex tear of medial meniscus, current injury, left knee, initial encounter: Secondary | ICD-10-CM | POA: Diagnosis not present

## 2017-08-05 DIAGNOSIS — S83282A Other tear of lateral meniscus, current injury, left knee, initial encounter: Secondary | ICD-10-CM | POA: Diagnosis not present

## 2017-08-05 DIAGNOSIS — G8918 Other acute postprocedural pain: Secondary | ICD-10-CM | POA: Diagnosis not present

## 2017-08-05 DIAGNOSIS — S83272A Complex tear of lateral meniscus, current injury, left knee, initial encounter: Secondary | ICD-10-CM | POA: Diagnosis not present

## 2017-08-13 DIAGNOSIS — M25562 Pain in left knee: Secondary | ICD-10-CM | POA: Diagnosis not present

## 2017-08-15 NOTE — Progress Notes (Deleted)
[place] at [facility] [address], [place], [place] [address] [phone]  Date:  08/18/2017   Name:  Laurie Hernandez   DOB:  [birth year]   MRN:  [id number]  PCP:  Darreld Mclean, MD    Chief Complaint: No chief complaint on file.   History of Present Illness:  Laurie Hernandez is a 61 y.o. very pleasant female patient who presents with the following:  Here today to follow-up on her DM  Lab Results  Component Value Date   HGBA1C 6.8 (H) 04/07/2017   Note from last visit: Former pt of [facility] who has moved to a new location History of DM- needs to follow-up on this today Also seen 2 months ago by Dr. Larose Kells for palpitations- she had run out of her BB at the time. She is back on her BB and palpitations are resolved.  Most recent A1c as below  She has noted weight gain- up about 25 lbs over the last 6 months.  Admits that she is eating more and not exercising like she used to    [facility] Readings from Last 3 Encounters:  02/19/17 171 lb (77.6 kg)  09/30/16 152 lb 6 oz (69.1 kg)  06/23/16 160 lb 4 oz (72.7 kg)   Her grown son moved back home which has been stressful for her.    She loves him and states that he is always welcome, but she worried that he lost his job and it is just a big change having him back at home Her husband and son follow a ketogenic diet; "guess who ate all the carbs."   She has gotten back onto her mountain bike recently- this is her preferred form of exercise- and hopes that she will soon shed the weight she gained  She had been on farxiga/ glipizide. They changed her to metfomrin/ farxiga due known as xigduo but she is not taking this any longer due to SE of metfomrin,  She would prefer to go back on glipizide if possible Her glucose was high the last couple of days- She is just taking farxiaga 10 at his time She is out of her crestor- out of this for about 3 months  She has a history of  vitamin D deficiency which was treated with high dose supplement last year- she is not sure if she needs to be taking any supplement now  She is fasting today for labs  She is on effexor and does feel like this is working for her, does not feel that she is depressed   Needs a foot exam, urine microalbumin, flu shot, a1c today  Patient Active Problem List   Diagnosis Date Noted  . History of vitamin D deficiency 06/27/2016  . History of kidney stones 11/30/2015  . Depression 01/20/2015  . Migraine without aura and without status migrainosus, not intractable 01/20/2015  . Type 2 diabetes mellitus without complication ([facility]) 99/37/1696    Past Medical History:  Diagnosis Date  . Colon polyps   . Depression   . Diabetes mellitus   . DVT (deep venous thrombosis) (HCC)    Left Leg post Fx, 1979  . Headache(784.0)    migraines  . Hepatitis    dx in 8th grade-not sure what type, can not donate blood.  Marland Kitchen History of chicken pox    childhood  . History of kidney stones    no surgery required  . Hyperlipidemia   .  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 Rectal bleeding   . SVD (spontaneous vaginal delivery)    x 1  . UTI (lower urinary tract infection)     Past Surgical History:  Procedure Laterality Date  . ABDOMINAL HYSTERECTOMY  2000  . ANTERIOR AND POSTERIOR REPAIR  09/13/2012   Procedure: ANTERIOR (CYSTOCELE) AND POSTERIOR REPAIR (RECTOCELE);  Surgeon: Gus Height, MD;  Location: [facility];  Service: Gynecology;  Laterality: N/A;  . BREAST SURGERY  1994   Left lumpectomy x 3 -  [facility]  . [facility]   [facility]  . TONSILLECTOMY    . WISDOM TOOTH EXTRACTION      Social History  Substance Use Topics  . Smoking status: Former Smoker    Types: Cigarettes  . Smokeless tobacco: Never Used     Comment: Only when teenager for 1.5 years  . Alcohol use 1.8 oz/week    3 Cans of beer per week     Comment: socially    Family History  Problem Relation Age of Onset  .  Arthritis/Rheumatoid Father 65       Deceased  . Colon cancer Father   . Heart disease Father   . Hypertension Father   . Diabetes Father   . Hyperlipidemia Mother        Living  . Glaucoma Mother   . Sarcoidosis Mother   . Ovarian cancer Maternal Grandmother   . Breast cancer Maternal Grandmother   . Thrombocytopenia Brother        #1  . Leukemia Brother        #1  . Hyperlipidemia Brother        #2  . Sleep apnea Brother        #2  . Sudden death Maternal Uncle   . Heart attack Maternal Uncle   . Depression Son     Allergies  Allergen Reactions  . Penicillins     As a child - can't remember  . Metformin And Related Nausea And Vomiting    Medication list has been reviewed and updated.  Current Outpatient Prescriptions on File Prior to Visit  Medication Sig Dispense Refill  . aspirin 81 MG tablet Take 81 mg by mouth daily.    Marland Kitchen atenolol (TENORMIN) 25 MG tablet TAKE 1 TABLET BY MOUTH DAILY 90 tablet 3  . dapagliflozin propanediol (FARXIGA) 10 MG TABS tablet Take 10 mg by mouth daily. 90 tablet 3  . estradiol (ESTRACE) 0.5 MG tablet Take 1 tablet daily 90 tablet 3  . glipiZIDE (GLUCOTROL XL) 5 MG 24 hr tablet Take 1 tablet (5 mg total) by mouth daily with breakfast. 90 tablet 3  . glucose blood test strip Use as instructed to test blood glucose daily. Dx: E11.9 OneTouch Verio (Patient not taking: Reported on 06/08/2017) 150 each 3  . Lancets (ONETOUCH ULTRASOFT) lancets Use as instructed (Patient not taking: Reported on 06/08/2017) 100 each 12  . rosuvastatin (CRESTOR) 10 MG tablet Take 1 tablet (10 mg total) by mouth daily. 90 tablet 3  . SUMAtriptan (IMITREX) 100 MG tablet Take 1 tablet (100 mg total) by mouth daily as needed for migraine. 30 tablet 1  . venlafaxine XR (EFFEXOR-XR) 37.5 MG 24 hr capsule Take 1 capsule (37.5 mg total) by mouth daily with breakfast. 90 capsule 1  . Vitamin D, Ergocalciferol, (DRISDOL) 50000 units CAPS capsule Take 1 capsule (50,000 Units  total) by mouth every 7 (seven) days. 12 capsule 0   No  current facility-administered medications on file prior to visit.     Review of Systems:  ***  Physical Examination: There were no vitals filed for this visit. There were no vitals filed for this visit. There is no height or weight on file to calculate BMI. Ideal Body Weight:    ***  Assessment and Plan: ***  Signed Lamar Blinks, MD

## 2017-08-17 DIAGNOSIS — M25562 Pain in left knee: Secondary | ICD-10-CM | POA: Diagnosis not present

## 2017-08-18 ENCOUNTER — Ambulatory Visit: Payer: Self-pay | Admitting: Family Medicine

## 2017-08-20 DIAGNOSIS — M25562 Pain in left knee: Secondary | ICD-10-CM | POA: Diagnosis not present

## 2017-08-27 DIAGNOSIS — M25562 Pain in left knee: Secondary | ICD-10-CM | POA: Diagnosis not present

## 2017-08-31 DIAGNOSIS — M25562 Pain in left knee: Secondary | ICD-10-CM | POA: Diagnosis not present

## 2017-09-03 DIAGNOSIS — M25562 Pain in left knee: Secondary | ICD-10-CM | POA: Diagnosis not present

## 2017-09-21 NOTE — Progress Notes (Addendum)
[place] at [facility] [address] [place], [place] [address] [phone]  Date:  09/22/2017   Name:  Laurie Hernandez   DOB:  04-12-1956   MRN:  [id number]  PCP:  Darreld Mclean, MD    Chief Complaint: Follow-up (Pt here for f/u visit. Will get flu vaccine at work next week. )   History of Present Illness:  Laurie Hernandez is a 61 y.o. very pleasant female patient who presents with the following:  History of DM, depression Here today for a follow-up visit Last seen by myself in May of this year :  She has noted weight gain- up about 25 lbs over the last 6 months.  Admits that she is eating more and not exercising like she used to    [facility] Readings from Last 3 Encounters:  02/19/17 171 lb (77.6 kg)  09/30/16 152 lb 6 oz (69.1 kg)  06/23/16 160 lb 4 oz (72.7 kg)   Her grown son moved back home which has been stressful for her.    She loves him and states that he is always welcome, but she worried that he lost his job and it is just a big change having him back at home Her husband and son follow a ketogenic diet; "guess who ate all the carbs."   She has gotten back onto her mountain bike recently- this is her preferred form of exercise- and hopes that she will soon shed the weight she gained  She had been on farxiga/ glipizide. They changed her to metfomrin/ farxiga due known as xigduo but she is not taking this any longer due to SE of metfomrin,  She would prefer to go back on glipizide if possible Her glucose was high the last couple of days- She is just taking farxiaga 10 at his time She is out of her crestor- out of this for about 3 months  She has a history of vitamin D deficiency which was treated with high dose supplement last year- she is not sure if she needs to be taking any supplement now  She is fasting today for labs  She is on effexor and does feel like this is working for her, does not feel that she  is depressed   Lab Results  Component Value Date   HGBA1C 6.8 (H) 04/07/2017   At our last visit, we made a few changes to her medication regiment: Your A1c is reasonable but should improve even more when you add back your glipizide  Your cholesterol will improve with crestor  Your thyroid is normal and metabolic profile is fine except for blood sugar  Your vitamin D is low again- I am going to rx the once weekly high dose vitamin D- 50,000iu  Take this for 12 weeks, and then change to 2000iu daily OTC  Flu: will do at work next week Foot exam due- done today Urine micro due- will do today Pneumonia vaccine: declines today Declines a tetanus shot today  She did fall hiking over the summer in [place] and had left knee surgery- this is doing well however. She has recovered well  Her son is still at home,but he is working full time and this is working out much better Her own mother is becoming more demented and this is becoming more troublesome- she lives in [place], is still on her own.  They do plan to move her closer soon  She did a  23 and me genetic test- it does show that she has greater risk for alzheimer's disease. We discussed this- it is not clear what the clinical implications of commercial genetic tests may be. However, certainly it is a good idea to stay active and as healthy as she can, and to keep in mind that dementia may be an issue for her later on  She is riding a bike and trying to get in better shape She is always battling to lose weight  She is taking both farxiga and glipizide for her DM She is taking OTC vitamin D- would like to check her level today   Patient Active Problem List   Diagnosis Date Noted  . History of vitamin D deficiency 06/27/2016  . History of kidney stones 11/30/2015  . Depression 01/20/2015  . Migraine without aura and without status migrainosus, not intractable 01/20/2015  . Type 2 diabetes mellitus without complication ([place]) 29/47/6546     Past Medical History:  Diagnosis Date  . Colon polyps   . Depression   . Diabetes mellitus   . DVT (deep venous thrombosis) (HCC)    Left Leg post Fx, 1979  . Headache(784.0)    migraines  . Hepatitis    dx in 8th grade-not sure what type, can not donate blood.  Marland Kitchen History of chicken pox    childhood  . History of kidney stones    no surgery required  . Hyperlipidemia   . 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 Rectal bleeding   . SVD (spontaneous vaginal delivery)    x 1  . UTI (lower urinary tract infection)     Past Surgical History:  Procedure Laterality Date  . ABDOMINAL HYSTERECTOMY  2000  . ANTERIOR AND POSTERIOR REPAIR  09/13/2012   Procedure: ANTERIOR (CYSTOCELE) AND POSTERIOR REPAIR (RECTOCELE);  Surgeon: Gus Height, MD;  Location: [facility];  Service: Gynecology;  Laterality: N/A;  . BREAST SURGERY  1994   Left lumpectomy x 3 -  [facility]  . [facility]   [facility]  . TONSILLECTOMY    . WISDOM TOOTH EXTRACTION      Social History  Substance Use Topics  . Smoking status: Former Smoker    Types: Cigarettes  . Smokeless tobacco: Never Used     Comment: Only when teenager for 1.5 years  . Alcohol use 1.8 oz/week    3 Cans of beer per week     Comment: socially    Family History  Problem Relation Age of Onset  . Arthritis/Rheumatoid Father 59       Deceased  . Colon cancer Father   . Heart disease Father   . Hypertension Father   . Diabetes Father   . Hyperlipidemia Mother        Living  . Glaucoma Mother   . Sarcoidosis Mother   . Ovarian cancer Maternal Grandmother   . Breast cancer Maternal Grandmother   . Thrombocytopenia Brother        #1  . Leukemia Brother        #1  . Hyperlipidemia Brother        #2  . Sleep apnea Brother        #2  . Sudden death Maternal Uncle   . Heart attack Maternal Uncle   . Depression Son     Allergies  Allergen Reactions  . Penicillins     As a child - can't remember  . Metformin And Related Nausea And  Vomiting    Medication list has been reviewed and updated.  Current Outpatient Prescriptions on File Prior to Visit  Medication Sig Dispense Refill  . aspirin 81 MG tablet Take 81 mg by mouth daily.    Marland Kitchen atenolol (TENORMIN) 25 MG tablet TAKE 1 TABLET BY MOUTH DAILY 90 tablet 3  . dapagliflozin propanediol (FARXIGA) 10 MG TABS tablet Take 10 mg by mouth daily. 90 tablet 3  . estradiol (ESTRACE) 0.5 MG tablet Take 1 tablet daily 90 tablet 3  . glipiZIDE (GLUCOTROL XL) 5 MG 24 hr tablet Take 1 tablet (5 mg total) by mouth daily with breakfast. 90 tablet 3  . glucose blood test strip Use as instructed to test blood glucose daily. Dx: E11.9 OneTouch Verio 150 each 3  . Lancets (ONETOUCH ULTRASOFT) lancets Use as instructed 100 each 12  . rosuvastatin (CRESTOR) 10 MG tablet Take 1 tablet (10 mg total) by mouth daily. 90 tablet 3  . SUMAtriptan (IMITREX) 100 MG tablet Take 1 tablet (100 mg total) by mouth daily as needed for migraine. 30 tablet 1  . venlafaxine XR (EFFEXOR-XR) 37.5 MG 24 hr capsule Take 1 capsule (37.5 mg total) by mouth daily with breakfast. 90 capsule 1   No current facility-administered medications on file prior to visit.     Review of Systems:  As per HPI- otherwise negative.   Physical Examination: Vitals:   09/22/17 1249  BP: 133/82  Pulse: 80  Temp: 98.4 F (36.9 C)  SpO2: 97%   Vitals:   09/22/17 1249  Weight: 185 lb 12.8 oz (84.3 kg)  Height: 5' 4.5" (1.638 m)   Body mass index is 31.4 kg/m. Ideal Body Weight: Weight in (lb) to have BMI = 25: 147.6  GEN: WDWN, NAD, Non-toxic, A & O x 3, overweight. Looks well othewise  HEENT: Atraumatic, Normocephalic. Neck supple. No masses, No LAD. Ears and Nose: No external deformity. CV: RRR, No M/G/R. No JVD. No thrill. No extra heart sounds. PULM: CTA B, no wheezes, crackles, rhonchi. No retractions. No resp. distress. No accessory muscle use. ABD: S, NT, ND, +BS. No rebound. No HSM. EXTR: No c/c/e NEURO  Normal gait.  PSYCH: Normally interactive. Conversant. Not depressed or anxious appearing.  Calm demeanor.  Normal foot exam today  Assessment and Plan: Controlled type 2 diabetes mellitus without complication, without long-term current use of insulin (HCC) - Plan: Hemoglobin A1c, Microalbumin / creatinine urine ratio  Dyslipidemia  Vitamin D deficiency - Plan: Vitamin D (25 hydroxy)  Here today for a followup visit Await labs and will be in touch with her asap She declines pneumonia and tetanus vaccines today, will get flu shot next week at work Foot exam done Will plan further follow- up pending labs.   Signed Lamar Blinks, MD  Received her labs10/18  Results for orders placed or performed in visit on 09/22/17  Hemoglobin A1c  Result Value Ref Range   Hgb A1c MFr Bld 6.4 4.6 - 6.5 %  Microalbumin / creatinine urine ratio  Result Value Ref Range   Microalb, Ur <0.7 0.0 - 1.9 mg/dL   Creatinine,U 54.2 mg/dL   Microalb Creat Ratio 1.3 0.0 - 30.0 mg/g  Vitamin D (25 hydroxy)  Result Value Ref Range   VITD 22.77 (L) 30.00 - 100.00 ng/mL

## 2017-09-22 ENCOUNTER — Ambulatory Visit (INDEPENDENT_AMBULATORY_CARE_PROVIDER_SITE_OTHER): Payer: 59 | Admitting: Family Medicine

## 2017-09-22 VITALS — BP 133/82 | HR 80 | Temp 98.4°F | Ht 64.5 in | Wt 185.8 lb

## 2017-09-22 DIAGNOSIS — E785 Hyperlipidemia, unspecified: Secondary | ICD-10-CM | POA: Diagnosis not present

## 2017-09-22 DIAGNOSIS — E559 Vitamin D deficiency, unspecified: Secondary | ICD-10-CM

## 2017-09-22 DIAGNOSIS — E119 Type 2 diabetes mellitus without complications: Secondary | ICD-10-CM | POA: Diagnosis not present

## 2017-09-22 LAB — HEMOGLOBIN A1C: Hgb A1c MFr Bld: 6.4 % (ref 4.6–6.5)

## 2017-09-22 LAB — MICROALBUMIN / CREATININE URINE RATIO
CREATININE, U: 54.2 mg/dL
Microalb Creat Ratio: 1.3 mg/g (ref 0.0–30.0)

## 2017-09-22 LAB — VITAMIN D 25 HYDROXY (VIT D DEFICIENCY, FRACTURES): VITD: 22.77 ng/mL — ABNORMAL LOW (ref 30.00–100.00)

## 2017-09-22 NOTE — Patient Instructions (Signed)
It was nice to see you again today! I will be in touch with your labs asap

## 2017-09-23 ENCOUNTER — Encounter: Payer: Self-pay | Admitting: Family Medicine

## 2017-09-23 MED ORDER — VITAMIN D (ERGOCALCIFEROL) 1.25 MG (50000 UNIT) PO CAPS: 50000.0000 [IU] | ORAL_CAPSULE | ORAL | 0 refills | Status: DC

## 2017-09-23 NOTE — Addendum Note (Signed)
Addended by: Lamar Blinks C on: 09/23/2017 05:42 AM   Modules accepted: Orders

## 2017-10-11 DIAGNOSIS — H43811 Vitreous degeneration, right eye: Secondary | ICD-10-CM | POA: Diagnosis not present

## 2017-10-12 DIAGNOSIS — H43811 Vitreous degeneration, right eye: Secondary | ICD-10-CM | POA: Diagnosis not present

## 2017-10-12 DIAGNOSIS — E119 Type 2 diabetes mellitus without complications: Secondary | ICD-10-CM | POA: Diagnosis not present

## 2017-10-12 DIAGNOSIS — H40023 Open angle with borderline findings, high risk, bilateral: Secondary | ICD-10-CM | POA: Diagnosis not present

## 2017-11-03 DIAGNOSIS — M2042 Other hammer toe(s) (acquired), left foot: Secondary | ICD-10-CM | POA: Diagnosis not present

## 2017-11-11 ENCOUNTER — Other Ambulatory Visit: Payer: Self-pay | Admitting: Family Medicine

## 2017-11-16 DIAGNOSIS — H43811 Vitreous degeneration, right eye: Secondary | ICD-10-CM | POA: Diagnosis not present

## 2017-11-16 DIAGNOSIS — H5319 Other subjective visual disturbances: Secondary | ICD-10-CM | POA: Diagnosis not present

## 2017-11-16 DIAGNOSIS — H43391 Other vitreous opacities, right eye: Secondary | ICD-10-CM | POA: Diagnosis not present

## 2018-02-22 DIAGNOSIS — H43391 Other vitreous opacities, right eye: Secondary | ICD-10-CM | POA: Diagnosis not present

## 2018-02-22 DIAGNOSIS — H40023 Open angle with borderline findings, high risk, bilateral: Secondary | ICD-10-CM | POA: Diagnosis not present

## 2018-02-22 DIAGNOSIS — H43811 Vitreous degeneration, right eye: Secondary | ICD-10-CM | POA: Diagnosis not present

## 2018-04-25 ENCOUNTER — Encounter: Payer: Self-pay | Admitting: Family Medicine

## 2018-04-25 DIAGNOSIS — E785 Hyperlipidemia, unspecified: Secondary | ICD-10-CM

## 2018-04-25 DIAGNOSIS — E119 Type 2 diabetes mellitus without complications: Secondary | ICD-10-CM

## 2018-04-26 MED ORDER — DAPAGLIFLOZIN PROPANEDIOL 10 MG PO TABS: 10.0000 mg | ORAL_TABLET | Freq: Every day | ORAL | 0 refills | Status: DC

## 2018-04-26 MED ORDER — GLIPIZIDE ER 5 MG PO TB24: 5.0000 mg | ORAL_TABLET | Freq: Every day | ORAL | 0 refills | Status: DC

## 2018-04-26 MED ORDER — ROSUVASTATIN CALCIUM 10 MG PO TABS: 10.0000 mg | ORAL_TABLET | Freq: Every day | ORAL | 0 refills | Status: DC

## 2018-05-17 NOTE — Progress Notes (Addendum)
[place] at [facility] [address], [place], [place] [address] [phone]  Date:  05/18/2018   Name:  Laurie Hernandez   DOB:  02-May-1956   MRN:  [id number]  PCP:  Darreld Mclean, MD    Chief Complaint: Medication Refill (glipizide, farxiga, rosuvastatin, atenolol, effexor) and Diabetes   History of Present Illness:  Laurie Hernandez is a 62 y.o. very pleasant female patient who presents with the following:  Here for a diabetes and medication recheck History of DM, migraine, depression, vit D def  Lab Results  Component Value Date   HGBA1C 6.4 09/22/2017   From our last visit in October: She did fall hiking over the summer in [place] and had left knee surgery- this is doing well however. She has recovered well Her son is still at home,but he is working full time and this is working out much better Her own mother is becoming more demented and this is becoming more troublesome- she lives in [place], is still on her own. They do plan to move her closer soon She did a 23 and me genetic test- it does show that she has greater risk for alzheimer's disease. We discussed this- it is not clear what the clinical implications of commercial genetic tests may be. However, certainly it is a good idea to stay active and as healthy as she can, and to keep in mind that dementia may be an issue for her later on She is riding a bike and trying to get in better shape She is always battling to lose weight She is taking both farxiga and glipizide for her DM She is taking OTC vitamin D- would like to check her level today   Eye exam-  She is a pt of Dr. Kathlen Mody with [facility], last visit was 2 months ago Do foot exam today- done Pneumonia vaccine? Pt wishes to do next time Tdap? She would like to do next time  Needs full labs, vit D and A1c today She is fasting today  Crestor, farxiga, glipizide, atenolol, effexor  [facility] Readings from Last 3  Encounters:  05/18/18 188 lb (85.3 kg)  09/22/17 185 lb 12.8 oz (84.3 kg)  02/19/17 171 lb (77.6 kg)   Her left knee is doing well, continuing to get better following her operation last year.  She can do really everything that she wants to do in general  She plans to live with her mom in [place] for about a month to better judge how she is doing with her dementia  She is on estrace 0.5 mg- she would like to potentially come off this med  She is on effexor xr 37.5 mg; she is interested in coming off this as well.    She has gained some weight and admits to not being physically active; she plans to try and do better.   She would like to do some exercise at home Her niece in [place] is expecting twin girls any day!   Patient Active Problem List   Diagnosis Date Noted  . History of vitamin D deficiency 06/27/2016  . History of kidney stones 11/30/2015  . Depression 01/20/2015  . Migraine without aura and without status migrainosus, not intractable 01/20/2015  . Type 2 diabetes mellitus without complication ([place]) 22/97/9892    Past Medical History:  Diagnosis Date  . Colon polyps   . Depression   . Diabetes mellitus   . DVT (  deep venous thrombosis) (HCC)    Left Leg post Fx, 1979  . Headache(784.0)    migraines  . Hepatitis    dx in 8th grade-not sure what type, can not donate blood.  Marland Kitchen History of chicken pox    childhood  . History of kidney stones    no surgery required  . Hyperlipidemia   . 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 Rectal bleeding   . SVD (spontaneous vaginal delivery)    x 1  . UTI (lower urinary tract infection)     Past Surgical History:  Procedure Laterality Date  . ABDOMINAL HYSTERECTOMY  2000  . ANTERIOR AND POSTERIOR REPAIR  09/13/2012   Procedure: ANTERIOR (CYSTOCELE) AND POSTERIOR REPAIR (RECTOCELE);  Surgeon: Gus Height, MD;  Location: [facility];  Service: Gynecology;  Laterality: N/A;  . BREAST SURGERY  1994   Left lumpectomy x 3 -  [facility]  . [facility]   [facility]  . TONSILLECTOMY    . WISDOM TOOTH EXTRACTION      Social History   Tobacco Use  . Smoking status: Former Smoker    Types: Cigarettes  . Smokeless tobacco: Never Used  . Tobacco comment: Only when teenager for 1.5 years  Substance Use Topics  . Alcohol use: Yes    Alcohol/week: 1.8 oz    Types: 3 Cans of beer per week    Comment: socially  . Drug use: No    Family History  Problem Relation Age of Onset  . Arthritis/Rheumatoid Father 37       Deceased  . Colon cancer Father   . Heart disease Father   . Hypertension Father   . Diabetes Father   . Hyperlipidemia Mother        Living  . Glaucoma Mother   . Sarcoidosis Mother   . Ovarian cancer Maternal Grandmother   . Breast cancer Maternal Grandmother   . Thrombocytopenia Brother        #1  . Leukemia Brother        #1  . Hyperlipidemia Brother        #2  . Sleep apnea Brother        #2  . Sudden death Maternal Uncle   . Heart attack Maternal Uncle   . Depression Son     Allergies  Allergen Reactions  . Penicillins     As a child - can't remember  . Metformin And Related Nausea And Vomiting    Medication list has been reviewed and updated.  Current Outpatient Medications on File Prior to Visit  Medication Sig Dispense Refill  . aspirin 81 MG tablet Take 81 mg by mouth daily.    . Cholecalciferol (VITAMIN D3) 2000 units TABS Take 2,000 Units by mouth.    . estradiol (ESTRACE) 0.5 MG tablet Take 1 tablet daily 90 tablet 3  . glucose blood test strip Use as instructed to test blood glucose daily. Dx: E11.9 OneTouch Verio 150 each 3  . Lancets (ONETOUCH ULTRASOFT) lancets Use as instructed 100 each 12  . Multiple Vitamins-Minerals (CENTRUM SILVER 50+WOMEN) TABS Take 1 tablet by mouth daily.    . SUMAtriptan (IMITREX) 100 MG tablet Take 1 tablet (100 mg total) by mouth daily as needed for migraine. 30 tablet 1   No current facility-administered medications on file prior to visit.     Review  of Systems:  As per HPI- otherwise negative.   Physical Examination: Vitals:   05/18/18 0825  BP: 110/78  Pulse: 63  Resp: 16  SpO2: 93%   Vitals:   05/18/18 0825  Weight: 188 lb (85.3 kg)   Body mass index is 31.77 kg/m. Ideal Body Weight:    GEN: WDWN, NAD, Non-toxic, A & O x 3, mild obesity, otherwise looks well  HEENT: Atraumatic, Normocephalic. Neck supple. No masses, No LAD.  Bilateral TM wnl, oropharynx normal.  PEERL,EOMI.   Ears and Nose: No external deformity. CV: RRR, No M/G/R. No JVD. No thrill. No extra heart sounds. PULM: CTA B, no wheezes, crackles, rhonchi. No retractions. No resp. distress. No accessory muscle use. EXTR: No c/c/e NEURO Normal gait.  PSYCH: Normally interactive. Conversant. Not depressed or anxious appearing.  Calm demeanor.    Assessment and Plan: Controlled type 2 diabetes mellitus without complication, without long-term current use of insulin ([place]) - Plan: Comprehensive metabolic panel, Hemoglobin A1c, glipiZIDE (GLUCOTROL XL) 5 MG 24 hr tablet, dapagliflozin propanediol (FARXIGA) 10 MG TABS tablet  Dyslipidemia - Plan: Lipid panel, rosuvastatin (CRESTOR) 10 MG tablet  Vitamin D deficiency - Plan: Vitamin D (25 hydroxy)  Screening for deficiency anemia - Plan: CBC  Palpitations - Plan: atenolol (TENORMIN) 25 MG tablet  Dysthymia - Plan: venlafaxine XR (EFFEXOR-XR) 37.5 MG 24 hr capsule  Weight gain  Following up today She would like to do her immunizations next time She would like to taper off effexor and also estrogen; discussed with her today Will plan further follow- up pending labs. Encouraged exercise for health and weight loss   Signed Lamar Blinks, MD  Received her labs 6/14, message to pt Results for orders placed or performed in visit on 05/18/18  CBC  Result Value Ref Range   WBC 5.5 4.0 - 10.5 K/uL   RBC 4.71 3.87 - 5.11 Mil/uL   Platelets 239.0 150.0 - 400.0 K/uL   Hemoglobin 14.9 12.0 - 15.0 g/dL   HCT  43.5 36.0 - 46.0 %   MCV 92.3 78.0 - 100.0 fl   MCHC 34.1 30.0 - 36.0 g/dL   RDW 12.9 11.5 - 15.5 %  Comprehensive metabolic panel  Result Value Ref Range   Sodium 140 135 - 145 mEq/L   Potassium 4.6 3.5 - 5.1 mEq/L   Chloride 105 96 - 112 mEq/L   CO2 29 19 - 32 mEq/L   Glucose, Bld 151 (H) 70 - 99 mg/dL   BUN 14 6 - 23 mg/dL   Creatinine, Ser 0.59 0.40 - 1.20 mg/dL   Total Bilirubin 0.6 0.2 - 1.2 mg/dL   Alkaline Phosphatase 63 39 - 117 U/L   AST 17 0 - 37 U/L   ALT 16 0 - 35 U/L   Total Protein 6.6 6.0 - 8.3 g/dL   Albumin 4.3 3.5 - 5.2 g/dL   Calcium 9.1 8.4 - 10.5 mg/dL   GFR 109.42 >60.00 mL/min  Hemoglobin A1c  Result Value Ref Range   Hgb A1c MFr Bld 6.6 (H) 4.6 - 6.5 %  Lipid panel  Result Value Ref Range   Cholesterol 192 0 - 200 mg/dL   Triglycerides 92.0 0.0 - 149.0 mg/dL   HDL 69.50 >39.00 mg/dL   VLDL 18.4 0.0 - 40.0 mg/dL   LDL Cholesterol 105 (H) 0 - 99 mg/dL   Total CHOL/HDL Ratio 3    NonHDL 122.97   Vitamin D (25 hydroxy)  Result Value Ref Range   VITD 21.98 (L) 30.00 - 100.00 ng/mL    Blood counts are normal Metabolic profile is overall  fine A1c shows fine control of your diabetes on current medications Cholesterol also looks good- continue crestor Your vitamin D is low again!  I am going to try treating you with 50K vitamin D3 instead of D2; I was reading some evidence that this may be more effective in raising your D levels.  You will take this once a week for 12 weeks, then go back to daily OTC supplementation of 2000IU  Take care and let me know if any questions Let's plan to visit in about 6 months

## 2018-05-18 ENCOUNTER — Ambulatory Visit (INDEPENDENT_AMBULATORY_CARE_PROVIDER_SITE_OTHER): Payer: 59 | Admitting: Family Medicine

## 2018-05-18 ENCOUNTER — Encounter: Payer: Self-pay | Admitting: Family Medicine

## 2018-05-18 ENCOUNTER — Other Ambulatory Visit: Payer: Self-pay | Admitting: Family Medicine

## 2018-05-18 VITALS — BP 110/78 | HR 63 | Resp 16 | Wt 188.0 lb

## 2018-05-18 DIAGNOSIS — R635 Abnormal weight gain: Secondary | ICD-10-CM | POA: Diagnosis not present

## 2018-05-18 DIAGNOSIS — R002 Palpitations: Secondary | ICD-10-CM

## 2018-05-18 DIAGNOSIS — Z130 Encounter for screening for diseases of the blood and blood-forming organs and certain disorders involving the immune mechanism: Secondary | ICD-10-CM | POA: Diagnosis not present

## 2018-05-18 DIAGNOSIS — Z1231 Encounter for screening mammogram for malignant neoplasm of breast: Secondary | ICD-10-CM

## 2018-05-18 DIAGNOSIS — E785 Hyperlipidemia, unspecified: Secondary | ICD-10-CM

## 2018-05-18 DIAGNOSIS — E119 Type 2 diabetes mellitus without complications: Secondary | ICD-10-CM | POA: Diagnosis not present

## 2018-05-18 DIAGNOSIS — F341 Dysthymic disorder: Secondary | ICD-10-CM

## 2018-05-18 DIAGNOSIS — E559 Vitamin D deficiency, unspecified: Secondary | ICD-10-CM | POA: Diagnosis not present

## 2018-05-18 LAB — CBC
HEMATOCRIT: 43.5 % (ref 36.0–46.0)
HEMOGLOBIN: 14.9 g/dL (ref 12.0–15.0)
MCHC: 34.1 g/dL (ref 30.0–36.0)
MCV: 92.3 fl (ref 78.0–100.0)
Platelets: 239 10*3/uL (ref 150.0–400.0)
RBC: 4.71 Mil/uL (ref 3.87–5.11)
RDW: 12.9 % (ref 11.5–15.5)
WBC: 5.5 10*3/uL (ref 4.0–10.5)

## 2018-05-18 LAB — COMPREHENSIVE METABOLIC PANEL
ALBUMIN: 4.3 g/dL (ref 3.5–5.2)
ALK PHOS: 63 U/L (ref 39–117)
ALT: 16 U/L (ref 0–35)
AST: 17 U/L (ref 0–37)
BILIRUBIN TOTAL: 0.6 mg/dL (ref 0.2–1.2)
BUN: 14 mg/dL (ref 6–23)
CO2: 29 mEq/L (ref 19–32)
Calcium: 9.1 mg/dL (ref 8.4–10.5)
Chloride: 105 mEq/L (ref 96–112)
Creatinine, Ser: 0.59 mg/dL (ref 0.40–1.20)
GFR: 109.42 mL/min (ref >60.00)
Glucose, Bld: 151 mg/dL — ABNORMAL HIGH (ref 70–99)
POTASSIUM: 4.6 meq/L (ref 3.5–5.1)
Sodium: 140 mEq/L (ref 135–145)
TOTAL PROTEIN: 6.6 g/dL (ref 6.0–8.3)

## 2018-05-18 LAB — LIPID PANEL
CHOL/HDL RATIO: 3
Cholesterol: 192 mg/dL (ref 0–200)
HDL: 69.5 mg/dL (ref >39.00)
LDL Cholesterol: 105 mg/dL — ABNORMAL HIGH (ref 0–99)
NONHDL: 122.97
TRIGLYCERIDES: 92 mg/dL (ref 0.0–149.0)
VLDL: 18.4 mg/dL (ref 0.0–40.0)

## 2018-05-18 LAB — HEMOGLOBIN A1C: HEMOGLOBIN A1C: 6.6 % — AB (ref 4.6–6.5)

## 2018-05-18 LAB — VITAMIN D 25 HYDROXY (VIT D DEFICIENCY, FRACTURES): VITD: 21.98 ng/mL — ABNORMAL LOW (ref 30.00–100.00)

## 2018-05-18 MED ORDER — DAPAGLIFLOZIN PROPANEDIOL 10 MG PO TABS: 10.0000 mg | ORAL_TABLET | Freq: Every day | ORAL | 3 refills | Status: DC

## 2018-05-18 MED ORDER — ROSUVASTATIN CALCIUM 10 MG PO TABS: 10.0000 mg | ORAL_TABLET | Freq: Every day | ORAL | 3 refills | Status: DC

## 2018-05-18 MED ORDER — ATENOLOL 25 MG PO TABS: 25.0000 mg | ORAL_TABLET | Freq: Every day | ORAL | 3 refills | Status: DC

## 2018-05-18 MED ORDER — GLIPIZIDE ER 5 MG PO TB24: 5.0000 mg | ORAL_TABLET | Freq: Every day | ORAL | 3 refills | Status: DC

## 2018-05-18 MED ORDER — VENLAFAXINE HCL ER 37.5 MG PO CP24: 37.5000 mg | ORAL_CAPSULE | Freq: Every day | ORAL | 0 refills | Status: DC

## 2018-05-18 NOTE — Patient Instructions (Signed)
Great to see you today as always!  I will be in touch with your labs Please do work on getting more exercise, and don't be afraid to do some weight training. This will help build muscle mass which can increase your metabolism  To come off effexor- please take it every other day for 2 weeks, then every 3rd or 4th day for 2 weeks.  You can cut your estrogen pill in 1/2 and take a 1/2 dose daily for a month, then stop Let me know if any problems with coming off these meds. You may have some hot flashes   Assuming all is ok, let's plan to meet for a physical in 6 months

## 2018-05-20 ENCOUNTER — Encounter: Payer: Self-pay | Admitting: Family Medicine

## 2018-05-20 MED ORDER — VITAMIN D3 1.25 MG (50000 UT) PO CAPS: ORAL_CAPSULE | ORAL | 0 refills | Status: DC

## 2018-05-20 NOTE — Addendum Note (Signed)
Addended by: Lamar Blinks C on: 05/20/2018 06:17 AM   Modules accepted: Orders

## 2018-05-30 ENCOUNTER — Other Ambulatory Visit: Payer: Self-pay | Admitting: Family Medicine

## 2018-05-31 ENCOUNTER — Ambulatory Visit
Admission: RE | Admit: 2018-05-31 | Discharge: 2018-05-31 | Disposition: A | Discharge status: Home / Self Care | Payer: 59 | Source: Ambulatory Visit | Attending: Family Medicine | Admitting: Family Medicine

## 2018-05-31 DIAGNOSIS — Z1231 Encounter for screening mammogram for malignant neoplasm of breast: Secondary | ICD-10-CM

## 2018-06-07 ENCOUNTER — Other Ambulatory Visit: Payer: Self-pay | Admitting: Family Medicine

## 2018-06-07 DIAGNOSIS — G43009 Migraine without aura, not intractable, without status migrainosus: Secondary | ICD-10-CM

## 2018-08-13 ENCOUNTER — Other Ambulatory Visit: Payer: Self-pay | Admitting: Family Medicine

## 2018-08-13 DIAGNOSIS — E559 Vitamin D deficiency, unspecified: Secondary | ICD-10-CM

## 2018-09-05 DIAGNOSIS — H43812 Vitreous degeneration, left eye: Secondary | ICD-10-CM | POA: Diagnosis not present

## 2018-09-05 DIAGNOSIS — H5319 Other subjective visual disturbances: Secondary | ICD-10-CM | POA: Diagnosis not present

## 2018-09-05 DIAGNOSIS — H43392 Other vitreous opacities, left eye: Secondary | ICD-10-CM | POA: Diagnosis not present

## 2018-09-13 DIAGNOSIS — E119 Type 2 diabetes mellitus without complications: Secondary | ICD-10-CM | POA: Diagnosis not present

## 2018-09-13 DIAGNOSIS — H40023 Open angle with borderline findings, high risk, bilateral: Secondary | ICD-10-CM | POA: Diagnosis not present

## 2018-09-13 DIAGNOSIS — H43392 Other vitreous opacities, left eye: Secondary | ICD-10-CM | POA: Diagnosis not present

## 2018-11-13 NOTE — Progress Notes (Addendum)
[facility] [address] [place], [place] [address] [phone]  Date:  11/16/2018   Name:  Laurie Hernandez   DOB:  01-11-1956   MRN:  [id number]  PCP:  Darreld Mclean, MD    Chief Complaint: Immunizations (pneumonia, tdap, pt not in 76, starting new job) and GI referral (would like to stay with [facility])   History of Present Illness:  Laurie Hernandez is a 62 y.o. very pleasant female patient who presents with the following:  Recheck and immun update today - she took a new job and needs updated Tdap and pneumonia vaccines today. She has not been able to find a date from her last tdap or pneumonia   History of DM, migraine, vitamin D def, depression I last saw her in June at which time she planned to taper off her estrogen and her effexor She did stop her effexor and is weaning off estrogen She is doing fine without effexor From last lab notes: Blood counts are normal Metabolic profile is overall fine A1c shows fine control of your diabetes on current medications Cholesterol also looks good- continue crestor Your vitamin D is low again!  I am going to try treating you with 50K vitamin D3 instead of D2; I was reading some evidence that this may be more effective in raising your D levels.  You will take this once a week for 12 weeks, then go back to daily OTC supplementation of 2000IU  Urine micro: today Pneumonia vaccine: today tdap due today a1c due- check today She is finishing out her rx high dose vitamin D so will check her level today  Flu done Shingrix: discussed with her  She does colon Q 5 years and would like to catch up; she would like to see [facility] now, she had been an [facility] pt in the past.  Wants to have all her doctors in the same practice   Lab Results  Component Value Date   HGBA1C 6.6 (H) 05/18/2018   Asa 81 Atenolol Estrace Glipizide crestor imitrex prn   Patient Active  Problem List   Diagnosis Date Noted  . History of vitamin D deficiency 06/27/2016  . History of kidney stones 11/30/2015  . Depression 01/20/2015  . Migraine without aura and without status migrainosus, not intractable 01/20/2015  . Type 2 diabetes mellitus without complication ([facility]) 42/68/3419    Past Medical History:  Diagnosis Date  . Colon polyps   . Depression   . Diabetes mellitus   . DVT (deep venous thrombosis) (HCC)    Left Leg post Fx, 1979  . Headache(784.0)    migraines  . Hepatitis    dx in 8th grade-not sure what type, can not donate blood.  Marland Kitchen History of chicken pox    childhood  . History of kidney stones    no surgery required  . Hyperlipidemia   . 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 Rectal bleeding   . SVD (spontaneous vaginal delivery)    x 1  . UTI (lower urinary tract infection)     Past Surgical History:  Procedure Laterality Date  . ABDOMINAL HYSTERECTOMY  2000  . ANTERIOR AND POSTERIOR REPAIR  09/13/2012   Procedure: ANTERIOR (CYSTOCELE) AND POSTERIOR REPAIR (RECTOCELE);  Surgeon: Gus Height, MD;  Location: [facility];  Service: Gynecology;  Laterality: N/A;  . BREAST EXCISIONAL BIOPSY    . [place]  Left lumpectomy x 3 -  [facility]  . COLONOSCOPY  08/2012   at [facility]  . TONSILLECTOMY    . WISDOM TOOTH EXTRACTION      Social History   Tobacco Use  . Smoking status: Former Smoker    Types: Cigarettes  . Smokeless tobacco: Never Used  . Tobacco comment: Only when teenager for 1.5 years  Substance Use Topics  . Alcohol use: Yes    Alcohol/week: 3.0 standard drinks    Types: 3 Cans of beer per week    Comment: socially  . Drug use: No    Family History  Problem Relation Age of Onset  . Arthritis/Rheumatoid Father 63       Deceased  . Colon cancer Father   . Heart disease Father   . Hypertension Father   . Diabetes Father   . Hyperlipidemia Mother        Living  . Glaucoma Mother   . Sarcoidosis Mother   . Ovarian cancer  Maternal Grandmother   . Breast cancer Maternal Grandmother   . Thrombocytopenia Brother        #1  . Leukemia Brother        #1  . Hyperlipidemia Brother        #2  . Sleep apnea Brother        #2  . Sudden death Maternal Uncle   . Heart attack Maternal Uncle   . Depression Son     Allergies  Allergen Reactions  . Penicillins     As a child - can't remember  . Metformin And Related Nausea And Vomiting    Medication list has been reviewed and updated.  Current Outpatient Medications on File Prior to Visit  Medication Sig Dispense Refill  . aspirin 81 MG tablet Take 81 mg by mouth daily.    Marland Kitchen atenolol (TENORMIN) 25 MG tablet Take 1 tablet (25 mg total) by mouth daily. 90 tablet 3  . Cholecalciferol (VITAMIN D3) 2000 units TABS Take 2,000 Units by mouth.    . Cholecalciferol (VITAMIN D3) 50000 units CAPS TAKE ONE WEEKLY FOR 12 WEEKS 12 capsule 4  . estradiol (ESTRACE) 0.5 MG tablet TAKE 1 TABLET DAILY 90 tablet 1  . glipiZIDE (GLUCOTROL XL) 5 MG 24 hr tablet Take 1 tablet (5 mg total) by mouth daily with breakfast. 90 tablet 3  . glucose blood test strip Use as instructed to test blood glucose daily. Dx: E11.9 OneTouch Verio 150 each 3  . Lancets (ONETOUCH ULTRASOFT) lancets Use as instructed 100 each 12  . Multiple Vitamins-Minerals (CENTRUM SILVER 50+WOMEN) TABS Take 1 tablet by mouth daily.    . rosuvastatin (CRESTOR) 10 MG tablet Take 1 tablet (10 mg total) by mouth daily. 90 tablet 3  . SUMAtriptan (IMITREX) 100 MG tablet TAKE 1 TABLET (100 MG TOTAL) BY MOUTH DAILY AS NEEDED FOR MIGRAINE. 9 tablet 6   No current facility-administered medications on file prior to visit.     Review of Systems:  As per HPI- otherwise negative. No fever or chills No CP or SOB  Some stress in helping her mom who is elderly     Physical Examination: Vitals:   11/16/18 0943  BP: 124/82  Pulse: 75  Resp: 16  Temp: 98.1 F (36.7 C)  SpO2: 98%   Vitals:   There is no height or  weight on file to calculate BMI. [facility] Body Weight:    GEN: WDWN, NAD, Non-toxic, A & O x 3, looks  well, overweight  HEENT: Atraumatic, Normocephalic. Neck supple. No masses, No LAD. Ears and Nose: No external deformity. CV: RRR, No M/G/R. No JVD. No thrill. No extra heart sounds. PULM: CTA B, no wheezes, crackles, rhonchi. No retractions. No resp. distress. No accessory muscle use. ABD: S, NT, ND EXTR: No c/c/e NEURO Normal gait.  PSYCH: Normally interactive. Conversant. Not depressed or anxious appearing.  Calm demeanor.    Assessment and Plan: Vitamin D deficiency - Plan: Vitamin D (25 hydroxy)  Dyslipidemia  Controlled type 2 diabetes mellitus without complication, without long-term current use of insulin (HCC) - Plan: Hemoglobin A1c, Microalbumin / creatinine urine ratio  Immunization due - Plan: Pneumococcal polysaccharide vaccine 23-valent greater than or equal to 2yo subcutaneous/IM, Tdap vaccine greater than or equal to 7yo IM  Colon cancer screening - Plan: Ambulatory referral to Gastroenterology  Following up today.  Will obtain labs as above, to monitor her diabetes.  She is due for periodic colon cancer screening, will refer to to [facility] gastroenterology per her request. She is started a new job, and does not have documentation of Tdap and pneumonia vaccines.  Administer these vaccines today. Also discussed and encouraged Shingrix vaccine at her convenience. She has stopped her Effexor and feels that her mood is fine without this medication.  Signed Lamar Blinks, MD  Received her labs, message to pt  Results for orders placed or performed in visit on 11/16/18  Hemoglobin A1c  Result Value Ref Range   Hgb A1c MFr Bld 7.0 (H) 4.6 - 6.5 %  Microalbumin / creatinine urine ratio  Result Value Ref Range   Microalb, Ur <0.7 0.0 - 1.9 mg/dL   Creatinine,U 25.5 mg/dL   Microalb Creat Ratio 2.7 0.0 - 30.0 mg/g  Vitamin D (25 hydroxy)  Result Value Ref Range   VITD  56.89 30.00 - 100.00 ng/mL

## 2018-11-16 ENCOUNTER — Ambulatory Visit (INDEPENDENT_AMBULATORY_CARE_PROVIDER_SITE_OTHER): Payer: 59 | Admitting: Family Medicine

## 2018-11-16 ENCOUNTER — Encounter: Payer: Self-pay | Admitting: Family Medicine

## 2018-11-16 VITALS — BP 124/82 | HR 75 | Temp 98.1°F | Resp 16

## 2018-11-16 DIAGNOSIS — E559 Vitamin D deficiency, unspecified: Secondary | ICD-10-CM

## 2018-11-16 DIAGNOSIS — E785 Hyperlipidemia, unspecified: Secondary | ICD-10-CM

## 2018-11-16 DIAGNOSIS — Z23 Encounter for immunization: Secondary | ICD-10-CM

## 2018-11-16 DIAGNOSIS — Z1211 Encounter for screening for malignant neoplasm of colon: Secondary | ICD-10-CM

## 2018-11-16 DIAGNOSIS — E119 Type 2 diabetes mellitus without complications: Secondary | ICD-10-CM

## 2018-11-16 LAB — VITAMIN D 25 HYDROXY (VIT D DEFICIENCY, FRACTURES): VITD: 56.89 ng/mL (ref 30.00–100.00)

## 2018-11-16 LAB — HEMOGLOBIN A1C: HEMOGLOBIN A1C: 7 % — AB (ref 4.6–6.5)

## 2018-11-16 LAB — MICROALBUMIN / CREATININE URINE RATIO
Creatinine,U: 25.5 mg/dL
Microalb Creat Ratio: 2.7 mg/g (ref 0.0–30.0)

## 2018-11-16 NOTE — Patient Instructions (Addendum)
It was good to see you today.  Best of luck with your new job! I will be in touch your labs ASAP. You got your Tdap and pneumonia vaccine today. You may wish to get the shingles vaccine at your convenience. I have placed a referral to [facility] gastroenterology, let me know if you hear about your appointment within a few weeks. Assuming all is well, we will plan to visit in 6 months.

## 2019-01-04 ENCOUNTER — Encounter: Payer: Self-pay | Admitting: Family Medicine

## 2019-03-02 ENCOUNTER — Encounter: Payer: Self-pay | Admitting: Family Medicine

## 2019-03-06 NOTE — Telephone Encounter (Signed)
Opened in error

## 2019-06-06 ENCOUNTER — Other Ambulatory Visit: Payer: Self-pay | Admitting: Family Medicine

## 2019-06-06 DIAGNOSIS — R002 Palpitations: Secondary | ICD-10-CM

## 2019-06-06 DIAGNOSIS — E785 Hyperlipidemia, unspecified: Secondary | ICD-10-CM

## 2019-06-06 DIAGNOSIS — E119 Type 2 diabetes mellitus without complications: Secondary | ICD-10-CM

## 2019-06-15 ENCOUNTER — Encounter: Payer: Self-pay | Admitting: Family Medicine

## 2019-06-15 ENCOUNTER — Other Ambulatory Visit: Payer: Self-pay | Admitting: Family Medicine

## 2019-06-15 DIAGNOSIS — E785 Hyperlipidemia, unspecified: Secondary | ICD-10-CM

## 2019-06-15 MED ORDER — ROSUVASTATIN CALCIUM 10 MG PO TABS: 10.0000 mg | ORAL_TABLET | Freq: Every day | ORAL | 0 refills | Status: DC

## 2019-07-20 ENCOUNTER — Encounter: Payer: Self-pay | Admitting: Family Medicine

## 2019-07-31 NOTE — Patient Instructions (Addendum)
It was great to see you today, I will be in touch with your labs ASAP Assuming all looks okay, we can visit in 6 months  We can plan to use a preferred med to replace farxiaga- let me get your A1c and find out where we are  You got your flu shot and first shingles vaccine today- 2nd dose due in 2-6 months  We are so sorry for the loss of your mom!  Let me know if you are not doing ok.  Do try and get back into a routine of physical activity for health and energy    Health Maintenance, Female Adopting a healthy lifestyle and getting preventive care are important in promoting health and wellness. Ask your health care provider about:  The right schedule for you to have regular tests and exams.  Things you can do on your own to prevent diseases and keep yourself healthy. What should I know about diet, weight, and exercise? Eat a healthy diet   Eat a diet that includes plenty of vegetables, fruits, low-fat dairy products, and lean protein.  Do not eat a lot of foods that are high in solid fats, added sugars, or sodium. Maintain a healthy weight Body mass index (BMI) is used to identify weight problems. It estimates body fat based on height and weight. Your health care provider can help determine your BMI and help you achieve or maintain a healthy weight. Get regular exercise Get regular exercise. This is one of the most important things you can do for your health. Most adults should:  Exercise for at least 150 minutes each week. The exercise should increase your heart rate and make you sweat (moderate-intensity exercise).  Do strengthening exercises at least twice a week. This is in addition to the moderate-intensity exercise.  Spend less time sitting. Even light physical activity can be beneficial. Watch cholesterol and blood lipids Have your blood tested for lipids and cholesterol at 64 years of age, then have this test every 5 years. Have your cholesterol levels checked more often  if:  Your lipid or cholesterol levels are high.  You are older than 64 years of age.  You are at high risk for heart disease. What should I know about cancer screening? Depending on your health history and family history, you may need to have cancer screening at various ages. This may include screening for:  Breast cancer.  Cervical cancer.  Colorectal cancer.  Skin cancer.  Lung cancer. What should I know about heart disease, diabetes, and high blood pressure? Blood pressure and heart disease  High blood pressure causes heart disease and increases the risk of stroke. This is more likely to develop in people who have high blood pressure readings, are of African descent, or are overweight.  Have your blood pressure checked: ? Every 3-5 years if you are 29-55 years of age. ? Every year if you are 49 years old or older. Diabetes Have regular diabetes screenings. This checks your fasting blood sugar level. Have the screening done:  Once every three years after age 59 if you are at a normal weight and have a low risk for diabetes.  More often and at a younger age if you are overweight or have a high risk for diabetes. What should I know about preventing infection? Hepatitis B If you have a higher risk for hepatitis B, you should be screened for this virus. Talk with your health care provider to find out if you are at risk  for hepatitis B infection. Hepatitis C Testing is recommended for:  Everyone born from [birth year] through 1966.  Anyone with known risk factors for hepatitis C. Sexually transmitted infections (STIs)  Get screened for STIs, including gonorrhea and chlamydia, if: ? You are sexually active and are younger than 64 years of age. ? You are older than 64 years of age and your health care provider tells you that you are at risk for this type of infection. ? Your sexual activity has changed since you were last screened, and you are at increased risk for chlamydia or  gonorrhea. Ask your health care provider if you are at risk.  Ask your health care provider about whether you are at high risk for HIV. Your health care provider may recommend a prescription medicine to help prevent HIV infection. If you choose to take medicine to prevent HIV, you should first get tested for HIV. You should then be tested every 3 months for as long as you are taking the medicine. Pregnancy  If you are about to stop having your period (premenopausal) and you may become pregnant, seek counseling before you get pregnant.  Take 400 to 800 micrograms (mcg) of folic acid every day if you become pregnant.  Ask for birth control (contraception) if you want to prevent pregnancy. Osteoporosis and menopause Osteoporosis is a disease in which the bones lose minerals and strength with aging. This can result in bone fractures. If you are 44 years old or older, or if you are at risk for osteoporosis and fractures, ask your health care provider if you should:  Be screened for bone loss.  Take a calcium or vitamin D supplement to lower your risk of fractures.  Be given hormone replacement therapy (HRT) to treat symptoms of menopause. Follow these instructions at home: Lifestyle  Do not use any products that contain nicotine or tobacco, such as cigarettes, e-cigarettes, and chewing tobacco. If you need help quitting, ask your health care provider.  Do not use street drugs.  Do not share needles.  Ask your health care provider for help if you need support or information about quitting drugs. Alcohol use  Do not drink alcohol if: ? Your health care provider tells you not to drink. ? You are pregnant, may be pregnant, or are planning to become pregnant.  If you drink alcohol: ? Limit how much you use to 0-1 drink a day. ? Limit intake if you are breastfeeding.  Be aware of how much alcohol is in your drink. In the [place] one drink equals one 12 oz bottle of beer (355 mL), one 5 oz  glass of wine (148 mL), or one 1 oz glass of hard liquor (44 mL). General instructions  Schedule regular health, dental, and eye exams.  Stay current with your vaccines.  Tell your health care provider if: ? You often feel depressed. ? You have ever been abused or do not feel safe at home. Summary  Adopting a healthy lifestyle and getting preventive care are important in promoting health and wellness.  Follow your health care provider's instructions about healthy diet, exercising, and getting tested or screened for diseases.  Follow your health care provider's instructions on monitoring your cholesterol and blood pressure. This information is not intended to replace advice given to you by your health care provider. Make sure you discuss any questions you have with your health care provider. Document Released: 06/08/2011 Document Revised: 11/16/2018 Document Reviewed: 11/16/2018 Elsevier Patient Education  2020 [employer].

## 2019-07-31 NOTE — Progress Notes (Addendum)
[facility] [address], [place], [place] [address] [phone]  Date:  08/02/2019   Name:  Laurie Hernandez   DOB:  May 06, 1956   MRN:  [id number]  PCP:  Laurie Mclean, MD    Chief Complaint: Diabetes, Hyperlipidemia, and Flu Vaccine (discuss flu shot, pneumonia and shingles vaccine)   History of Present Illness:  CADINCE SZOPINSKI is a 64 y.o. very pleasant female patient who presents with the following:  Here today for follow-up visit- CPE History of vitamin D deficiency, depression, diabetes, migraine headache Last seen by myself in December-at that time her A1c showed good control of diabetes  Her mother died in 01/08/2023- they were planning to move her from [place] to [facility], she was declining but passed away before they could move her closer  She had a fall, got pneumonia while in rehab.  Her father passed away in 03/08/10 Family dynamics after her mother passed away have been difficult- her brother threw away a lot of things from the house that Laurie Hernandez wanted to see or perhaps keep to remember her mom.  However she is moving past this sadness  She had stopped her effexor in December - she feels like she is handling her sx ok though social interaction and meditation  We had rx [place] last year but she did not start due to cost- it looks like her insurnance prefers another SGLT2 inhibitor - jardiance, invokana, synjardy  She notes that her glucose is much too high- her fastings may run over 200  We will check A1c today She is fasting today for labs   Lab Results  Component Value Date   HGBA1C 7.0 (H) 11/16/2018   Eye exam: she went to [facility] most recently in November A1c is due-do complete labs Foot exam is due Flu shot- give today Shingrix- start today  S/p hyst in 03/09/1999 for benign disease, she has one ovary  mammo done in June 2019- she will have this done in [place] as this is in her network- will let me know  if any difficulty scheduling this test  She did have a dexa scan years ago  She enjoys mountain biking for exercise- however she has not really been exercising or as active due to her mood  She is a Marine scientist, 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 History of vitamin D deficiency 06/27/2016  . History of kidney stones 11/30/2015  . Depression 01/20/2015  . Migraine without aura and without status migrainosus, not intractable 01/20/2015  . Type 2 diabetes mellitus without complication ([place]) [id number]    Past Medical History:  Diagnosis Date  . Colon polyps   . Depression   . Diabetes mellitus   . DVT (deep venous thrombosis) (HCC)    Left Leg post Fx, 1979  . Headache(784.0)    migraines  . Hepatitis    dx in 8th grade-not sure what type, can not donate blood.  Marland Kitchen History of chicken pox    childhood  . History of kidney stones    no surgery required  . Hyperlipidemia   . 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 Rectal bleeding   . SVD (spontaneous vaginal delivery)    x 1  . UTI (lower urinary tract infection)     Past Surgical History:  Procedure Laterality Date  . ABDOMINAL HYSTERECTOMY  03/09/99  .  ANTERIOR AND POSTERIOR REPAIR  09/13/2012   Procedure: ANTERIOR (CYSTOCELE) AND POSTERIOR REPAIR (RECTOCELE);  Surgeon: Gus Height, MD;  Location: [facility];  Service: Gynecology;  Laterality: N/A;  . BREAST EXCISIONAL BIOPSY    . BREAST SURGERY  1994   Left lumpectomy x 3 -  [facility]  . COLONOSCOPY  08/2012   at [facility]  . TONSILLECTOMY    . WISDOM TOOTH EXTRACTION      Social History   Tobacco Use  . Smoking status: Former Smoker    Types: Cigarettes  . Smokeless tobacco: Never Used  . Tobacco comment: Only when teenager for 1.5 years  Substance Use Topics  . Alcohol use: Yes    Alcohol/week: 3.0 standard drinks    Types: 3 Cans of beer per week    Comment: socially  . Drug use: No    Family History  Problem Relation  Age of Onset  . Arthritis/Rheumatoid Father 34       Deceased  . Colon cancer Father   . Heart disease Father   . Hypertension Father   . Diabetes Father   . Hyperlipidemia Mother        Living  . Glaucoma Mother   . Sarcoidosis Mother   . Ovarian cancer Maternal Grandmother   . Breast cancer Maternal Grandmother   . Thrombocytopenia Brother        #1  . Leukemia Brother        #1  . Hyperlipidemia Brother        #2  . Sleep apnea Brother        #2  . Sudden death Maternal Uncle   . Heart attack Maternal Uncle   . Depression Son     Allergies  Allergen Reactions  . Penicillins     As a child - can't remember  . Metformin And Related Nausea And Vomiting    Medication list has been reviewed and updated.  Current Outpatient Medications on File Prior to Visit  Medication Sig Dispense Refill  . aspirin 81 MG tablet Take 81 mg by mouth daily.    Marland Kitchen atenolol (TENORMIN) 25 MG tablet Take 1 tablet (25 mg total) by mouth daily. 90 tablet 3  . Cholecalciferol (VITAMIN D3) 2000 units TABS Take 2,000 Units by mouth.    . Cholecalciferol (VITAMIN D3) 50000 units CAPS TAKE ONE WEEKLY FOR 12 WEEKS 12 capsule 4  . glipiZIDE (GLUCOTROL XL) 5 MG 24 hr tablet Take 1 tablet (5 mg total) by mouth daily with breakfast. 90 tablet 3  . glucose blood test strip Use as instructed to test blood glucose daily. Dx: E11.9 OneTouch Verio 150 each 3  . Lancets (ONETOUCH ULTRASOFT) lancets Use as instructed 100 each 12  . Multiple Vitamins-Minerals (CENTRUM SILVER 50+WOMEN) TABS Take 1 tablet by mouth daily.    . rosuvastatin (CRESTOR) 10 MG tablet Take 1 tablet (10 mg total) by mouth daily. 90 tablet 1  . rosuvastatin (CRESTOR) 10 MG tablet Take 1 tablet (10 mg total) by mouth daily. 90 tablet 0  . SUMAtriptan (IMITREX) 100 MG tablet TAKE 1 TABLET (100 MG TOTAL) BY MOUTH DAILY AS NEEDED FOR MIGRAINE. 9 tablet 6   No current facility-administered medications on file prior to visit.     Review of  Systems:  As per HPI- otherwise negative. No fever or chills No CP or SOB   Physical Examination: Vitals:   08/02/19 0826  BP: 134/90  Pulse: 75  Resp: 16  Temp: 98  F (36.7 C)  SpO2: 98%   Vitals:   08/02/19 0826  Weight: 196 lb (88.9 kg)  Height: 5' 4.5" (1.638 m)   Body mass index is 33.12 kg/m. Ideal Body Weight: Weight in (lb) to have BMI = 25: 147.6  GEN: WDWN, NAD, Non-toxic, A & O x 3, overweight, looks well  HEENT: Atraumatic, Normocephalic. Neck supple. No masses, No LAD.  TM wnl Ears and Nose: No external deformity. CV: RRR, No M/G/R. No JVD. No thrill. No extra heart sounds. PULM: CTA B, no wheezes, crackles, rhonchi. No retractions. No resp. distress. No accessory muscle use. ABD: S, NT, ND, +BS. No rebound. No HSM. EXTR: No c/c/e NEURO Normal gait.  PSYCH: Normally interactive. Conversant. Not depressed or anxious appearing.  Calm demeanor.  Foot exam normal today   Assessment and Plan:   ICD-10-CM   1. Physical exam  Z00.00   2. Dyslipidemia  E78.5 Lipid panel  3. Controlled type 2 diabetes mellitus without complication, without long-term current use of insulin (HCC)  E11.9 Comprehensive metabolic panel    Hemoglobin A1c  4. Screening for deficiency anemia  Z13.0 CBC  5. Screening for thyroid disorder  Z13.29 TSH  6. Immunization due  Z23 Varicella-zoster vaccine IM (Shingrix)  7. Vitamin D deficiency  E55.9 Vitamin D (25 hydroxy)  8. Needs flu shot  Z23 Flu Vaccine QUAD 6+ mos PF IM (Fluarix Quad PF)  9. Other fatigue  R53.83    Physical exam today Labs pending as above Likely will need to add a med to her DM regimen Does not tolerate metformin Flu and shingrix given today Recommend dexa and prevnar 13 at age 79 Encouraged her to get back into an exercise program and she plans to do so    Follow-up: No follow-ups on file.  No orders of the defined types were placed in this encounter.  Orders Placed This Encounter  Procedures  .  Varicella-zoster vaccine IM (Shingrix)  . Flu Vaccine QUAD 6+ mos PF IM (Fluarix Quad PF)  . CBC  . Comprehensive metabolic panel  . Hemoglobin A1c  . Lipid panel  . TSH  . Vitamin D (25 hydroxy)    @SIGN @    Signed Lamar Blinks, MD  Received her labs, message to patient  Results for orders placed or performed in visit on 08/02/19  CBC  Result Value Ref Range   WBC 7.0 4.0 - 10.5 K/uL   RBC 4.52 3.87 - 5.11 Mil/uL   Platelets 247.0 150.0 - 400.0 K/uL   Hemoglobin 14.2 12.0 - 15.0 g/dL   HCT 42.0 36.0 - 46.0 %   MCV 92.9 78.0 - 100.0 fl   MCHC 33.7 30.0 - 36.0 g/dL   RDW 12.7 11.5 - 15.5 %  Comprehensive metabolic panel  Result Value Ref Range   Sodium 141 135 - 145 mEq/L   Potassium 4.4 3.5 - 5.1 mEq/L   Chloride 103 96 - 112 mEq/L   CO2 29 19 - 32 mEq/L   Glucose, Bld 231 (H) 70 - 99 mg/dL   BUN 9 6 - 23 mg/dL   Creatinine, Ser 0.63 0.40 - 1.20 mg/dL   Total Bilirubin 0.6 0.2 - 1.2 mg/dL   Alkaline Phosphatase 76 39 - 117 U/L   AST 22 0 - 37 U/L   ALT 27 0 - 35 U/L   Total Protein 7.0 6.0 - 8.3 g/dL   Albumin 4.5 3.5 - 5.2 g/dL   Calcium 9.0 8.4 - 10.5 mg/dL  GFR 95.08 >60.00 mL/min  Hemoglobin A1c  Result Value Ref Range   Hgb A1c MFr Bld 8.7 (H) 4.6 - 6.5 %  Lipid panel  Result Value Ref Range   Cholesterol 178 0 - 200 mg/dL   Triglycerides 120.0 0.0 - 149.0 mg/dL   HDL 68.50 >39.00 mg/dL   VLDL 24.0 0.0 - 40.0 mg/dL   LDL Cholesterol 85 0 - 99 mg/dL   Total CHOL/HDL Ratio 3    NonHDL 109.25   TSH  Result Value Ref Range   TSH 2.99 0.35 - 4.50 uIU/mL  Vitamin D (25 hydroxy)  Result Value Ref Range   VITD 37.83 30.00 - 100.00 ng/mL   A1c has gone up, will start her on SGLT2 inhibitor

## 2019-08-01 ENCOUNTER — Other Ambulatory Visit: Payer: Self-pay

## 2019-08-02 ENCOUNTER — Encounter: Payer: Self-pay | Admitting: Family Medicine

## 2019-08-02 ENCOUNTER — Ambulatory Visit: Payer: BC Managed Care – PPO | Admitting: Family Medicine

## 2019-08-02 VITALS — BP 134/90 | HR 75 | Temp 98.0°F | Resp 16 | Ht 64.5 in | Wt 196.0 lb

## 2019-08-02 DIAGNOSIS — E119 Type 2 diabetes mellitus without complications: Secondary | ICD-10-CM

## 2019-08-02 DIAGNOSIS — Z23 Encounter for immunization: Secondary | ICD-10-CM | POA: Diagnosis not present

## 2019-08-02 DIAGNOSIS — Z130 Encounter for screening for diseases of the blood and blood-forming organs and certain disorders involving the immune mechanism: Secondary | ICD-10-CM | POA: Diagnosis not present

## 2019-08-02 DIAGNOSIS — Z0000 Encounter for general adult medical examination without abnormal findings: Secondary | ICD-10-CM | POA: Diagnosis not present

## 2019-08-02 DIAGNOSIS — E785 Hyperlipidemia, unspecified: Secondary | ICD-10-CM | POA: Diagnosis not present

## 2019-08-02 DIAGNOSIS — E559 Vitamin D deficiency, unspecified: Secondary | ICD-10-CM | POA: Diagnosis not present

## 2019-08-02 DIAGNOSIS — Z1329 Encounter for screening for other suspected endocrine disorder: Secondary | ICD-10-CM | POA: Diagnosis not present

## 2019-08-02 DIAGNOSIS — R5383 Other fatigue: Secondary | ICD-10-CM

## 2019-08-02 LAB — COMPREHENSIVE METABOLIC PANEL
ALT: 27 U/L (ref 0–35)
AST: 22 U/L (ref 0–37)
Albumin: 4.5 g/dL (ref 3.5–5.2)
Alkaline Phosphatase: 76 U/L (ref 39–117)
BUN: 9 mg/dL (ref 6–23)
CO2: 29 mEq/L (ref 19–32)
Calcium: 9 mg/dL (ref 8.4–10.5)
Chloride: 103 mEq/L (ref 96–112)
Creatinine, Ser: 0.63 mg/dL (ref 0.40–1.20)
GFR: 95.08 mL/min (ref >60.00)
Glucose, Bld: 231 mg/dL — ABNORMAL HIGH (ref 70–99)
Potassium: 4.4 mEq/L (ref 3.5–5.1)
Sodium: 141 mEq/L (ref 135–145)
Total Bilirubin: 0.6 mg/dL (ref 0.2–1.2)
Total Protein: 7 g/dL (ref 6.0–8.3)

## 2019-08-02 LAB — CBC
HCT: 42 % (ref 36.0–46.0)
Hemoglobin: 14.2 g/dL (ref 12.0–15.0)
MCHC: 33.7 g/dL (ref 30.0–36.0)
MCV: 92.9 fl (ref 78.0–100.0)
Platelets: 247 10*3/uL (ref 150.0–400.0)
RBC: 4.52 Mil/uL (ref 3.87–5.11)
RDW: 12.7 % (ref 11.5–15.5)
WBC: 7 10*3/uL (ref 4.0–10.5)

## 2019-08-02 LAB — LIPID PANEL
Cholesterol: 178 mg/dL (ref 0–200)
HDL: 68.5 mg/dL (ref >39.00)
LDL Cholesterol: 85 mg/dL (ref 0–99)
NonHDL: 109.25
Total CHOL/HDL Ratio: 3
Triglycerides: 120 mg/dL (ref 0.0–149.0)
VLDL: 24 mg/dL (ref 0.0–40.0)

## 2019-08-02 LAB — VITAMIN D 25 HYDROXY (VIT D DEFICIENCY, FRACTURES): VITD: 37.83 ng/mL (ref 30.00–100.00)

## 2019-08-02 LAB — HEMOGLOBIN A1C: Hgb A1c MFr Bld: 8.7 % — ABNORMAL HIGH (ref 4.6–6.5)

## 2019-08-02 LAB — TSH: TSH: 2.99 u[IU]/mL (ref 0.35–4.50)

## 2019-08-03 ENCOUNTER — Encounter: Payer: Self-pay | Admitting: Family Medicine

## 2019-08-03 ENCOUNTER — Telehealth: Payer: Self-pay

## 2019-08-03 MED ORDER — JARDIANCE 10 MG PO TABS: 10.0000 mg | ORAL_TABLET | Freq: Every day | ORAL | 11 refills | Status: DC

## 2019-08-03 NOTE — Addendum Note (Signed)
Addended by: Lamar Blinks C on: 08/03/2019 05:55 AM   Modules accepted: Orders

## 2019-08-03 NOTE — Telephone Encounter (Signed)
PA initiated via [email or web address]- PA EOC ID: [id number]. Awaiting determination.

## 2019-08-08 NOTE — Telephone Encounter (Signed)
PA approved. Effective 08/08/2019 to 08/06/2020.

## 2019-08-31 ENCOUNTER — Other Ambulatory Visit: Payer: Self-pay | Admitting: Family Medicine

## 2019-08-31 DIAGNOSIS — E785 Hyperlipidemia, unspecified: Secondary | ICD-10-CM

## 2019-10-17 ENCOUNTER — Encounter: Payer: Self-pay | Admitting: Family Medicine

## 2019-10-17 DIAGNOSIS — E119 Type 2 diabetes mellitus without complications: Secondary | ICD-10-CM

## 2019-10-18 ENCOUNTER — Ambulatory Visit (INDEPENDENT_AMBULATORY_CARE_PROVIDER_SITE_OTHER): Payer: BC Managed Care – PPO

## 2019-10-18 ENCOUNTER — Other Ambulatory Visit: Payer: Self-pay

## 2019-10-18 DIAGNOSIS — Z23 Encounter for immunization: Secondary | ICD-10-CM | POA: Diagnosis not present

## 2019-10-18 MED ORDER — EMPAGLIFLOZIN 25 MG PO TABS: 25.0000 mg | ORAL_TABLET | Freq: Every day | ORAL | 6 refills | Status: DC

## 2019-10-18 NOTE — Progress Notes (Signed)
Pt in for Shingrix #2 vaccine, tolerated injection well.

## 2019-10-18 NOTE — Addendum Note (Signed)
Addended by: Lamar Blinks C on: 10/18/2019 08:37 AM   Modules accepted: Orders

## 2019-10-27 ENCOUNTER — Encounter: Payer: Self-pay | Admitting: Family Medicine

## 2019-10-27 NOTE — Telephone Encounter (Signed)
Laurie Hernandez, have you received the PA for this patient? Could you initiate one?

## 2019-10-27 NOTE — Telephone Encounter (Signed)
Error

## 2019-11-29 ENCOUNTER — Other Ambulatory Visit: Payer: Self-pay | Admitting: Family Medicine

## 2019-11-29 DIAGNOSIS — G43009 Migraine without aura, not intractable, without status migrainosus: Secondary | ICD-10-CM

## 2019-12-08 HISTORY — PX: KNEE CARTILAGE SURGERY: SHX688

## 2020-01-26 ENCOUNTER — Other Ambulatory Visit: Payer: Self-pay

## 2020-02-05 ENCOUNTER — Encounter: Payer: Self-pay | Admitting: Family Medicine

## 2020-02-05 ENCOUNTER — Other Ambulatory Visit (INDEPENDENT_AMBULATORY_CARE_PROVIDER_SITE_OTHER): Payer: PRIVATE HEALTH INSURANCE

## 2020-02-05 ENCOUNTER — Other Ambulatory Visit: Payer: Self-pay

## 2020-02-05 DIAGNOSIS — E119 Type 2 diabetes mellitus without complications: Secondary | ICD-10-CM

## 2020-02-05 LAB — MICROALBUMIN / CREATININE URINE RATIO
Creatinine,U: 45.2 mg/dL
Microalb Creat Ratio: 1.5 mg/g (ref 0.0–30.0)
Microalb, Ur: <0.7 mg/dL (ref 0.0–1.9)

## 2020-02-05 LAB — HEMOGLOBIN A1C: Hgb A1c MFr Bld: 7 % — ABNORMAL HIGH (ref 4.6–6.5)

## 2020-03-09 ENCOUNTER — Other Ambulatory Visit: Payer: Self-pay | Admitting: Family Medicine

## 2020-03-09 DIAGNOSIS — E785 Hyperlipidemia, unspecified: Secondary | ICD-10-CM

## 2020-05-12 ENCOUNTER — Encounter: Payer: Self-pay | Admitting: Family Medicine

## 2020-05-12 DIAGNOSIS — E119 Type 2 diabetes mellitus without complications: Secondary | ICD-10-CM

## 2020-05-13 MED ORDER — EMPAGLIFLOZIN 25 MG PO TABS: 25.0000 mg | ORAL_TABLET | Freq: Every day | ORAL | 6 refills | Status: DC

## 2020-06-02 ENCOUNTER — Encounter: Payer: Self-pay | Admitting: Family Medicine

## 2020-06-02 DIAGNOSIS — E119 Type 2 diabetes mellitus without complications: Secondary | ICD-10-CM

## 2020-06-02 DIAGNOSIS — R002 Palpitations: Secondary | ICD-10-CM

## 2020-06-03 MED ORDER — GLIPIZIDE ER 5 MG PO TB24: 5.0000 mg | ORAL_TABLET | Freq: Every day | ORAL | 1 refills | Status: DC

## 2020-06-03 MED ORDER — ATENOLOL 25 MG PO TABS: 25.0000 mg | ORAL_TABLET | Freq: Every day | ORAL | 1 refills | Status: DC

## 2020-08-05 NOTE — Patient Instructions (Addendum)
It was great to see you again today, I will be in touch with your labs soon as possible Please plan for your flu shot this fall and covid booster 8 months after the original series We are going to treat you for a UTI today with macrobid twice a day for a week- I will be in touch with culture     Health Maintenance After Age 65 After age 25, you are at a higher risk for certain long-term diseases and infections as well as injuries from falls. Falls are a major cause of broken bones and head injuries in people who are older than age 34. Getting regular preventive care can help to keep you healthy and well. Preventive care includes getting regular testing and making lifestyle changes as recommended by your health care provider. Talk with your health care provider about:  Which screenings and tests you should have. A screening is a test that checks for a disease when you have no symptoms.  A diet and exercise plan that is right for you. What should I know about screenings and tests to prevent falls? Screening and testing are the best ways to find a health problem early. Early diagnosis and treatment give you the best chance of managing medical conditions that are common after age 74. Certain conditions and lifestyle choices may make you more likely to have a fall. Your health care provider may recommend:  Regular vision checks. Poor vision and conditions such as cataracts can make you more likely to have a fall. If you wear glasses, make sure to get your prescription updated if your vision changes.  Medicine review. Work with your health care provider to regularly review all of the medicines you are taking, including over-the-counter medicines. Ask your health care provider about any side effects that may make you more likely to have a fall. Tell your health care provider if any medicines that you take make you feel dizzy or sleepy.  Osteoporosis screening. Osteoporosis is a condition that causes the  bones to get weaker. This can make the bones weak and cause them to break more easily.  Blood pressure screening. Blood pressure changes and medicines to control blood pressure can make you feel dizzy.  Strength and balance checks. Your health care provider may recommend certain tests to check your strength and balance while standing, walking, or changing positions.  Foot health exam. Foot pain and numbness, as well as not wearing proper footwear, can make you more likely to have a fall.  Depression screening. You may be more likely to have a fall if you have a fear of falling, feel emotionally low, or feel unable to do activities that you used to do.  Alcohol use screening. Using too much alcohol can affect your balance and may make you more likely to have a fall. What actions can I take to lower my risk of falls? General instructions  Talk with your health care provider about your risks for falling. Tell your health care provider if: ? You fall. Be sure to tell your health care provider about all falls, even ones that seem minor. ? You feel dizzy, sleepy, or off-balance.  Take over-the-counter and prescription medicines only as told by your health care provider. These include any supplements.  Eat a healthy diet and maintain a healthy weight. A healthy diet includes low-fat dairy products, low-fat (lean) meats, and fiber from whole grains, beans, and lots of fruits and vegetables. Home safety  Remove any tripping hazards,  such as rugs, cords, and clutter.  Install safety equipment such as grab bars in bathrooms and safety rails on stairs.  Keep rooms and walkways well-lit. Activity   Follow a regular exercise program to stay fit. This will help you maintain your balance. Ask your health care provider what types of exercise are appropriate for you.  If you need a cane or walker, use it as recommended by your health care provider.  Wear supportive shoes that have nonskid  soles. Lifestyle  Do not drink alcohol if your health care provider tells you not to drink.  If you drink alcohol, limit how much you have: ? 0-1 drink a day for women. ? 0-2 drinks a day for men.  Be aware of how much alcohol is in your drink. In the [place] one drink equals one typical bottle of beer (12 oz), one-half glass of wine (5 oz), or one shot of hard liquor (1 oz).  Do not use any products that contain nicotine or tobacco, such as cigarettes and e-cigarettes. If you need help quitting, ask your health care provider. Summary  Having a healthy lifestyle and getting preventive care can help to protect your health and wellness after age 47.  Screening and testing are the best way to find a health problem early and help you avoid having a fall. Early diagnosis and treatment give you the best chance for managing medical conditions that are more common for people who are older than age 68.  Falls are a major cause of broken bones and head injuries in people who are older than age 69. Take precautions to prevent a fall at home.  Work with your health care provider to learn what changes you can make to improve your health and wellness and to prevent falls. This information is not intended to replace advice given to you by your health care provider. Make sure you discuss any questions you have with your health care provider. Document Revised: 03/16/2019 Document Reviewed: 10/06/2017 Elsevier Patient Education  2020 [employer].
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
## 2020-08-14 ENCOUNTER — Ambulatory Visit (INDEPENDENT_AMBULATORY_CARE_PROVIDER_SITE_OTHER): Payer: PRIVATE HEALTH INSURANCE | Admitting: Family Medicine

## 2020-08-14 ENCOUNTER — Other Ambulatory Visit: Payer: Self-pay

## 2020-08-14 ENCOUNTER — Encounter: Payer: Self-pay | Admitting: Family Medicine

## 2020-08-14 VITALS — BP 122/74 | HR 65 | Resp 16 | Ht 64.5 in | Wt 175.0 lb

## 2020-08-14 DIAGNOSIS — E119 Type 2 diabetes mellitus without complications: Secondary | ICD-10-CM | POA: Diagnosis not present

## 2020-08-14 DIAGNOSIS — E785 Hyperlipidemia, unspecified: Secondary | ICD-10-CM

## 2020-08-14 DIAGNOSIS — E559 Vitamin D deficiency, unspecified: Secondary | ICD-10-CM | POA: Diagnosis not present

## 2020-08-14 DIAGNOSIS — Z0000 Encounter for general adult medical examination without abnormal findings: Secondary | ICD-10-CM

## 2020-08-14 DIAGNOSIS — R350 Frequency of micturition: Secondary | ICD-10-CM

## 2020-08-14 DIAGNOSIS — Z1231 Encounter for screening mammogram for malignant neoplasm of breast: Secondary | ICD-10-CM

## 2020-08-14 DIAGNOSIS — Z130 Encounter for screening for diseases of the blood and blood-forming organs and certain disorders involving the immune mechanism: Secondary | ICD-10-CM

## 2020-08-14 DIAGNOSIS — Z114 Encounter for screening for human immunodeficiency virus [HIV]: Secondary | ICD-10-CM

## 2020-08-14 DIAGNOSIS — Z1329 Encounter for screening for other suspected endocrine disorder: Secondary | ICD-10-CM

## 2020-08-14 LAB — POCT URINALYSIS DIP (MANUAL ENTRY)
Bilirubin, UA: NEGATIVE
Glucose, UA: >=1000 mg/dL — AB
Ketones, POC UA: NEGATIVE mg/dL
Nitrite, UA: POSITIVE — AB
Spec Grav, UA: 1.01 (ref 1.010–1.025)
Urobilinogen, UA: 0.2 E.U./dL
pH, UA: 6 (ref 5.0–8.0)

## 2020-08-14 MED ORDER — NITROFURANTOIN MONOHYD MACRO 100 MG PO CAPS: 100.0000 mg | ORAL_CAPSULE | Freq: Two times a day (BID) | ORAL | 0 refills | Status: DC

## 2020-08-14 MED FILL — NITROFURANTOIN MONO-MCR 100: 100 | 7 days supply | Qty: 14 | Fill #0

## 2020-08-15 ENCOUNTER — Encounter: Payer: Self-pay | Admitting: Family Medicine

## 2020-08-15 LAB — CBC
HCT: 44 % (ref 35.0–45.0)
Hemoglobin: 14.9 g/dL (ref 11.7–15.5)
MCH: 31.1 pg (ref 27.0–33.0)
MCHC: 33.9 g/dL (ref 32.0–36.0)
MCV: 91.9 fL (ref 80.0–100.0)
MPV: 9.1 fL (ref 7.5–12.5)
Platelets: 243 10*3/uL (ref 140–400)
RBC: 4.79 10*6/uL (ref 3.80–5.10)
RDW: 12.4 % (ref 11.0–15.0)
WBC: 8.1 10*3/uL (ref 3.8–10.8)

## 2020-08-15 LAB — COMPREHENSIVE METABOLIC PANEL
AG Ratio: 1.6 (calc) (ref 1.0–2.5)
ALT: 14 U/L (ref 6–29)
AST: 18 U/L (ref 10–35)
Albumin: 4.4 g/dL (ref 3.6–5.1)
Alkaline phosphatase (APISO): 70 U/L (ref 37–153)
BUN: 16 mg/dL (ref 7–25)
CO2: 29 mmol/L (ref 20–32)
Calcium: 9.4 mg/dL (ref 8.6–10.4)
Chloride: 103 mmol/L (ref 98–110)
Creat: 0.73 mg/dL (ref 0.50–0.99)
Globulin: 2.7 g/dL (calc) (ref 1.9–3.7)
Glucose, Bld: 92 mg/dL (ref 65–99)
Potassium: 4 mmol/L (ref 3.5–5.3)
Sodium: 141 mmol/L (ref 135–146)
Total Bilirubin: 0.6 mg/dL (ref 0.2–1.2)
Total Protein: 7.1 g/dL (ref 6.1–8.1)

## 2020-08-15 LAB — HEMOGLOBIN A1C
Hgb A1c MFr Bld: 6.5 % of total Hgb — ABNORMAL HIGH (ref <5.7)
Mean Plasma Glucose: 140 (calc)
eAG (mmol/L): 7.7 (calc)

## 2020-08-15 LAB — LIPID PANEL
Cholesterol: 173 mg/dL (ref <200)
HDL: 73 mg/dL (ref >=50)
LDL Cholesterol (Calc): 78 mg/dL (calc)
Non-HDL Cholesterol (Calc): 100 mg/dL (calc) (ref <130)
Total CHOL/HDL Ratio: 2.4 (calc) (ref <5.0)
Triglycerides: 123 mg/dL (ref <150)

## 2020-08-15 LAB — VITAMIN D 25 HYDROXY (VIT D DEFICIENCY, FRACTURES): Vit D, 25-Hydroxy: 51 ng/mL (ref 30–100)

## 2020-08-15 LAB — TSH: TSH: 2.95 mIU/L (ref 0.40–4.50)

## 2020-08-17 LAB — URINE CULTURE
MICRO NUMBER:: 10924123
SPECIMEN QUALITY:: ADEQUATE

## 2020-08-20 ENCOUNTER — Other Ambulatory Visit: Payer: Self-pay

## 2020-08-20 ENCOUNTER — Ambulatory Visit (HOSPITAL_BASED_OUTPATIENT_CLINIC_OR_DEPARTMENT_OTHER)
Admission: RE | Admit: 2020-08-20 | Discharge: 2020-08-20 | Disposition: A | Discharge status: Home / Self Care | Payer: PRIVATE HEALTH INSURANCE | Source: Ambulatory Visit | Attending: Family Medicine | Admitting: Family Medicine

## 2020-08-20 DIAGNOSIS — Z1231 Encounter for screening mammogram for malignant neoplasm of breast: Secondary | ICD-10-CM | POA: Diagnosis not present

## 2020-08-21 ENCOUNTER — Ambulatory Visit (HOSPITAL_BASED_OUTPATIENT_CLINIC_OR_DEPARTMENT_OTHER): Payer: PRIVATE HEALTH INSURANCE

## 2020-09-07 ENCOUNTER — Other Ambulatory Visit: Payer: Self-pay | Admitting: Family Medicine

## 2020-09-07 DIAGNOSIS — E785 Hyperlipidemia, unspecified: Secondary | ICD-10-CM

## 2020-09-13 ENCOUNTER — Encounter: Payer: Self-pay | Admitting: Family Medicine

## 2020-09-13 DIAGNOSIS — E119 Type 2 diabetes mellitus without complications: Secondary | ICD-10-CM

## 2020-09-13 MED ORDER — EMPAGLIFLOZIN 25 MG PO TABS: 25.0000 mg | ORAL_TABLET | Freq: Every day | ORAL | 6 refills | Status: DC

## 2020-09-16 ENCOUNTER — Telehealth: Payer: Self-pay

## 2020-09-16 DIAGNOSIS — E119 Type 2 diabetes mellitus without complications: Secondary | ICD-10-CM

## 2020-09-16 MED ORDER — EMPAGLIFLOZIN 25 MG PO TABS: 25.0000 mg | ORAL_TABLET | Freq: Every day | ORAL | 0 refills | Status: DC

## 2020-09-16 NOTE — Addendum Note (Signed)
Addended by: Wynonia Musty A on: 09/16/2020 01:37 PM   Modules accepted: Orders

## 2020-09-16 NOTE — Telephone Encounter (Signed)
Caller Laurie Hernandez  Call Back # [phone] Prior Auth Office  : [phone]   Patient states she needs an prior authorization for medication listed below.   empagliflozin (JARDIANCE) 25 MG TABS tablet [[id number]]     Patient states she would like a 10 day supply of medication sent to local  pharmacy : [facility]  [address], [place], [place] [address] [phone]  until she can get other medication from mail order.

## 2020-09-16 NOTE — Telephone Encounter (Signed)
PA initiated via [email or web address]; KEY: [id number]. Awaiting determination.

## 2020-09-16 NOTE — Telephone Encounter (Signed)
I have sent short supply to patient.   Laurie Hernandez can you initiate PA for her? I tried but Im afraid I wasn't selecting the correct form.

## 2020-09-18 NOTE — Telephone Encounter (Signed)
PA denied. Awaiting denial information.  °

## 2020-09-20 NOTE — Telephone Encounter (Signed)
Caller name: Zigmund Daniel Call back number: [phone]  Patient states medication was denied due to information submitted was incorrect. Patient states she would like to speak to [place].

## 2020-09-20 NOTE — Telephone Encounter (Signed)
Prior Auth for Medication was denied   The company is requesting a letter or documentation showing how long patient used metformin , why it was discounted.  Appeal or Fax any information to: [phone]  Please advise

## 2020-09-20 NOTE — Telephone Encounter (Signed)
See mychart message. I'm also still waiting for PA denial information, if I receive that information I can try to appeal but haven't received it. Probably coming via mail.

## 2020-09-23 NOTE — Telephone Encounter (Signed)
Per our records, patient was prescribed metformin prior to epic therefore we do not have actual start date. Spoke with Provider. She advised me to send in start date that we have in epic and reasoning for discontinuation. I have sent this to company with fax number provided.

## 2020-09-24 ENCOUNTER — Encounter: Payer: Self-pay | Admitting: Family Medicine

## 2020-09-24 MED ORDER — EMPAGLIFLOZIN 25 MG PO TABS: 25.0000 mg | ORAL_TABLET | Freq: Every day | ORAL | 3 refills | Status: DC

## 2020-09-24 NOTE — Telephone Encounter (Signed)
Received approval letter from [employer]. [place] # [id number] overturning denial.  Approved through 09/24/2020-09/23/2021.   Patient notified of approval. 90 day supply with refills sent to pharmacy. Approval letter also faxed to pharmacy.

## 2020-09-24 NOTE — Addendum Note (Signed)
Addended by: Wynonia Musty A on: 09/24/2020 01:04 PM   Modules accepted: Orders

## 2020-12-08 ENCOUNTER — Other Ambulatory Visit: Payer: Self-pay | Admitting: Family Medicine

## 2020-12-08 DIAGNOSIS — R002 Palpitations: Secondary | ICD-10-CM

## 2020-12-08 DIAGNOSIS — E119 Type 2 diabetes mellitus without complications: Secondary | ICD-10-CM

## 2020-12-15 ENCOUNTER — Encounter: Payer: Self-pay | Admitting: Family Medicine

## 2020-12-16 ENCOUNTER — Other Ambulatory Visit: Payer: Self-pay

## 2020-12-16 ENCOUNTER — Telehealth (INDEPENDENT_AMBULATORY_CARE_PROVIDER_SITE_OTHER): Payer: PRIVATE HEALTH INSURANCE | Admitting: Family Medicine

## 2020-12-16 DIAGNOSIS — L309 Dermatitis, unspecified: Secondary | ICD-10-CM

## 2020-12-16 DIAGNOSIS — E119 Type 2 diabetes mellitus without complications: Secondary | ICD-10-CM | POA: Diagnosis not present

## 2020-12-16 MED ORDER — PREDNISONE 20 MG PO TABS: ORAL_TABLET | ORAL | 0 refills | Status: DC

## 2020-12-16 NOTE — Progress Notes (Signed)
[place] at [facility] [address] [place], [place] [address] [phone]  Date:  12/16/2020   Name:  Laurie Hernandez   DOB:  09-05-1956   MRN:  [id number]  PCP:  Darreld Mclean, MD    Chief Complaint: No chief complaint on file.   History of Present Illness:  Laurie Hernandez is a 65 y.o. very pleasant female patient who presents with the following:  Virtual visit today for patient with concern of illness and rash Last by myself for a physical in September 2021 Patient with history of well-controlled diabetes, migraine headache, vitamin D deficiency, depression, kidney stones Lab Results  Component Value Date   HGBA1C 6.5 (H) 08/14/2020    Patient location is home, my location is office.  Patient identity confirmed with 2 factors, she gives consent for virtual visit today The patient myself are present on the call today-connected with patient over video She contacted me yesterday with the following MyChart message:   Hi Dr. Edilia Bo.     On Wednesday of this week I developed a low grade fever (did not go over 100) with runny nose and just feel poorly.    Seemed a little better Thursday then developed a rash on my chest and  torso.   No fever Friday but rash progressively became worse.    It looks like poison ivy or poison oak.    Itching is unbearable despite Hydrocortisone cream and Benedryl.     Rash is on on my arms as well and today a patch has started behind my right knee .   I tried 3 different walk in Urgent care clinics yesterday without being able to be seen by anyone.   I really am not wanting to go to the ER but am concerned I may need  a steroid to help with this and am worried by scratching during sleeping is going to end up with a secondary infection.    Am attaching photos .    Please let me know what your thoughts are.    Frequent cool water showers with aloe are not helping either.   I am miserable.    I  will come to the office and do whatever is needed to get some relief.      Thanks.  Ka  The fever and runny nose lasted just one day- temp not over 100.  No cough, but a "tickle" in her throat She did not end up getting tested for covid-the symptoms resolved and she currently feels fine except for rash She broke out in a very itchy rash about 4 days ago.  She has had an itchy, poison ivy like rash which has broken out in several areas on her body. She is scratching herself even at night while she is asleep  No swelling of her lips on tongue, no SOB  She is able to show me her rash over video, she also sending some photos.  It appears patchy, irritated, weepy-suggestive of Rous dermatitis  She has not been doing any gardening or outdoor work, she did go for a walk in the woods but that was about 2 weeks ago She does have a cat who could possibly be bringing toxic plant oil into her home  Except for itching she currently feels well  covid done including booster  She does have well-controlled diabetes  Patient Active Problem List   Diagnosis Date Noted  .  History of vitamin D deficiency 06/27/2016  . History of kidney stones 11/30/2015  . Depression 01/20/2015  . Migraine without aura and without status migrainosus, not intractable 01/20/2015  . Type 2 diabetes mellitus without complication ([place]) 53/66/4403    Past Medical History:  Diagnosis Date  . Colon polyps   . Depression   . Diabetes mellitus   . DVT (deep venous thrombosis) (HCC)    Left Leg post Fx, 1979  . Headache(784.0)    migraines  . Hepatitis    dx in 8th grade-not sure what type, can not donate blood.  Marland Kitchen History of chicken pox    childhood  . History of kidney stones    no surgery required  . Hyperlipidemia   . 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 Rectal bleeding   . SVD (spontaneous vaginal delivery)    x 1  . UTI (lower urinary tract infection)     Past Surgical History:  Procedure Laterality Date  .  ABDOMINAL HYSTERECTOMY  2000  . ANTERIOR AND POSTERIOR REPAIR  09/13/2012   Procedure: ANTERIOR (CYSTOCELE) AND POSTERIOR REPAIR (RECTOCELE);  Surgeon: Gus Height, MD;  Location: [facility];  Service: Gynecology;  Laterality: N/A;  . BREAST EXCISIONAL BIOPSY    . BREAST SURGERY  1994   Left lumpectomy x 3 -  [facility]  . [facility]   [facility]  . TONSILLECTOMY    . WISDOM TOOTH EXTRACTION      Social History   Tobacco Use  . Smoking status: Former Smoker    Types: Cigarettes  . Smokeless tobacco: Never Used  . Tobacco comment: Only when teenager for 1.5 years  Substance Use Topics  . Alcohol use: Yes    Alcohol/week: 3.0 standard drinks    Types: 3 Cans of beer per week    Comment: socially  . Drug use: No    Family History  Problem Relation Age of Onset  . Arthritis/Rheumatoid Father 63       Deceased  . Colon cancer Father   . Heart disease Father   . Hypertension Father   . Diabetes Father   . Hyperlipidemia Mother        Living  . Glaucoma Mother   . Sarcoidosis Mother   . Ovarian cancer Maternal Grandmother   . Breast cancer Maternal Grandmother   . Thrombocytopenia Brother        #1  . Leukemia Brother        #1  . Hyperlipidemia Brother        #2  . Sleep apnea Brother        #2  . Sudden death Maternal Uncle   . Heart attack Maternal Uncle   . Depression Son     Allergies  Allergen Reactions  . Penicillins     As a child - can't remember  . Metformin And Related Nausea And Vomiting    Medication list has been reviewed and updated.  Current Outpatient Medications on File Prior to Visit  Medication Sig Dispense Refill  . aspirin 81 MG tablet Take 81 mg by mouth daily.    Marland Kitchen atenolol (TENORMIN) 25 MG tablet Take 1 tablet (25 mg total) by mouth daily. 90 tablet 0  . Biotin 10000 MCG TABS Take 10,000 mcg by mouth daily.    . Cholecalciferol (VITAMIN D3) 2000 units TABS Take 2,000 Units by mouth.    . cycloSPORINE (RESTASIS) 0.05 % ophthalmic  emulsion 1 drop 2 (two) times  daily.    . empagliflozin (JARDIANCE) 25 MG TABS tablet Take 1 tablet (25 mg total) by mouth daily before breakfast. 90 tablet 3  . glipiZIDE (GLUCOTROL XL) 5 MG 24 hr tablet Take 1 tablet (5 mg total) by mouth daily with breakfast. 90 tablet 0  . Multiple Vitamins-Minerals (CENTRUM SILVER 50+WOMEN) TABS Take 1 tablet by mouth daily.    . nitrofurantoin, macrocrystal-monohydrate, (MACROBID) 100 MG capsule Take 1 capsule (100 mg total) by mouth 2 (two) times daily. 14 capsule 0  . rosuvastatin (CRESTOR) 10 MG tablet Take 1 tablet (10 mg total) by mouth daily. 90 tablet 1  . SUMAtriptan (IMITREX) 100 MG tablet Take 1 tab by mouth daily as needed for migraine. 9 tablet 6   No current facility-administered medications on file prior to visit.    Review of Systems:  As per HPI- otherwise negative.   Physical Examination: There were no vitals filed for this visit. There were no vitals filed for this visit. There is no height or weight on file to calculate BMI. Ideal Body Weight:     Pt observed via video monitor-she looks well, no distress is noted.  No evidence of angioedema Her temp was 97.7 today Pulse 64 BP 108/60  Assessment and Plan: Dermatitis - Plan: predniSONE (DELTASONE) 20 MG tablet  Patient seen today with itchy dermatitis. Video used for the entirety of visit Patient was ill for 1 day last week, the symptoms then resolved.  Since then she has noticed a gradually spreading, very itchy rash on her trunk and limbs.  It does appear consistent with a contact dermatitis such as poison ivy, though it is the wrong time of year.  For the time being, we will start her on prednisone.  She does have diabetes, does not currently own a glucose meter.  I did encourage her to pick up an affordable glucose meter, I do not think she will need to use this consistently but while on prednisone it can be helpful to monitor.  We will have her use prednisone 40 mg for  3 days, then 20 mg for 5 days.  I advised her that glucose may normally reach the 200s while on prednisone.  If going higher than 300-350 please let me know  Continue Benadryl, topical steroids.  She will let me know if not improving soon  Signed Lamar Blinks, MD

## 2021-01-27 ENCOUNTER — Encounter: Payer: Self-pay | Admitting: Family Medicine

## 2021-01-28 ENCOUNTER — Ambulatory Visit: Payer: PRIVATE HEALTH INSURANCE | Admitting: Family Medicine

## 2021-01-28 ENCOUNTER — Other Ambulatory Visit: Payer: Self-pay

## 2021-01-28 ENCOUNTER — Encounter: Payer: Self-pay | Admitting: Family Medicine

## 2021-01-28 VITALS — BP 122/82 | HR 77 | Temp 98.2°F

## 2021-01-28 DIAGNOSIS — N3001 Acute cystitis with hematuria: Secondary | ICD-10-CM

## 2021-01-28 LAB — POC URINALSYSI DIPSTICK (AUTOMATED)
Bilirubin, UA: NEGATIVE
Blood, UA: POSITIVE
Glucose, UA: POSITIVE — AB
Ketones, UA: NEGATIVE
Nitrite, UA: NEGATIVE
Protein, UA: POSITIVE — AB
Spec Grav, UA: 1.015 (ref 1.010–1.025)
Urobilinogen, UA: 0.2 E.U./dL
pH, UA: 7 (ref 5.0–8.0)

## 2021-01-28 MED ORDER — NITROFURANTOIN MONOHYD MACRO 100 MG PO CAPS: 100.0000 mg | ORAL_CAPSULE | Freq: Two times a day (BID) | ORAL | 0 refills | Status: AC

## 2021-01-28 NOTE — Progress Notes (Signed)
Chief Complaint  Patient presents with  . Dysuria  . Urinary Frequency    Muscle spasm when urinating   . Hematuria    Laurie Hernandez is a 65 y.o. female here for possible UTI.  Duration: 4 days. Symptoms: Dysuria, urinary frequency, hematuria, urinary incontinence and urgency Denies: hematuria, urinary hesitancy, urinary retention, fever, nausea, flank pain, vomiting, vaginal discharge Hx of recurrent UTI? No Denies hx of pyelo.  Past Medical History:  Diagnosis Date  . Colon polyps   . Depression   . Diabetes mellitus   . DVT (deep venous thrombosis) (HCC)    Left Leg post Fx, 1979  . Headache(784.0)    migraines  . Hepatitis    dx in 8th grade-not sure what type, can not donate blood.  Marland Kitchen History of chicken pox    childhood  . History of kidney stones    no surgery required  . Hyperlipidemia   . 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 Rectal bleeding   . SVD (spontaneous vaginal delivery)    x 1  . UTI (lower urinary tract infection)      BP 122/82 (BP Location: Left Arm, Patient Position: Sitting, Cuff Size: Normal)   Pulse 77   Temp 98.2 F (36.8 C) (Oral)   SpO2 95%  General: Awake, alert, appears stated age Heart: RRR Lungs: CTAB, normal respiratory effort, no accessory muscle usage Abd: BS+, soft, NT, ND, no masses or organomegaly MSK: No CVA tenderness, neg Lloyd's sign Psych: Age appropriate judgment and insight  Acute cystitis with hematuria - Plan: nitrofurantoin, macrocrystal-monohydrate, (MACROBID) 100 MG capsule, POCT Urinalysis Dipstick (Automated), Urine Culture  5 d of Macrobid. Await cx results. UA suggestive of infection.  Stay hydrated. Seek immediate care if pt starts to develop fevers, new/worsening symptoms, uncontrollable N/V. F/u prn. The patient voiced understanding and agreement to the plan.  [place], DO 01/28/21 11:33 AM

## 2021-01-28 NOTE — Patient Instructions (Signed)
Stay hydrated.   Warning signs/symptoms: Uncontrollable nausea/vomiting, fevers, worsening symptoms despite treatment, confusion.  Give [place] around 2 business days to get culture back to you.  OK to take Tylenol 1000 mg (2 extra strength tabs) or 975 mg (3 regular strength tabs) every 6 hours as needed.  Ibuprofen 400-600 mg (2-3 over the counter strength tabs) every 6 hours as needed for pain.  Consider Azo/pyridium for relief.  Let us know if you need anything.

## 2021-01-30 LAB — URINE CULTURE
MICRO NUMBER:: 11563841
SPECIMEN QUALITY:: ADEQUATE

## 2021-02-25 LAB — HM DIABETES EYE EXAM

## 2021-03-05 ENCOUNTER — Other Ambulatory Visit: Payer: Self-pay | Admitting: Family Medicine

## 2021-03-05 DIAGNOSIS — R002 Palpitations: Secondary | ICD-10-CM

## 2021-03-05 DIAGNOSIS — E119 Type 2 diabetes mellitus without complications: Secondary | ICD-10-CM

## 2021-03-05 DIAGNOSIS — E785 Hyperlipidemia, unspecified: Secondary | ICD-10-CM

## 2021-05-29 ENCOUNTER — Other Ambulatory Visit: Payer: Self-pay | Admitting: Family Medicine

## 2021-05-29 DIAGNOSIS — R002 Palpitations: Secondary | ICD-10-CM

## 2021-05-29 DIAGNOSIS — E119 Type 2 diabetes mellitus without complications: Secondary | ICD-10-CM

## 2021-06-17 NOTE — Progress Notes (Addendum)
[facility] [place], [place], [place] [address] [phone]  Date:  06/18/2021   Name:  Laurie Hernandez   DOB:  29-Dec-1955   MRN:  [id number]  PCP:  Darreld Mclean, MD    Chief Complaint: Medical Management of Chronic Issues (F/u ) and right thumb pain (No injury- hurt for the last couple of weeks )   History of Present Illness:  Laurie Hernandez is a 65 y.o. 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- she likes doing this, plans to cut back on her time to 4 days a week soon  Married to [employer] Value Date   HGBA1C 6.5 (H) 08/14/2020   Bone density- will order for her to be done with next mammogram Pneumonia vaccine- she declines today  Shingrix is done COVID fourth dose complete Urine microalbumin is due Most recent labs in September Mammogram September 2021  She developed a painful nodule on her right thumb at the IP joint a week ago It is now somewhat better, but still tender and her range of motion is nontender percent normal She is not aware of any injury Her father had RA- she wonders if this could be an issue for her   She enjoys cycling for exercise- she rides 5x a week outdoors, 10 miles per day   No chest pain or shortness of breath with exercise Status post hysterectomy for benign disease  She notes she is actually taking atenolol to help with migraine headaches, she no longer suffers from migraines and wonders if she could taper off this medication  Atenolol Jardiance Glipizide Crestor Patient Active Problem List   Diagnosis Date Noted    History of vitamin D deficiency 06/27/2016   History of kidney stones 11/30/2015   Depression 01/20/2015   Migraine without aura and without status migrainosus, not intractable 01/20/2015   Type 2 diabetes mellitus without complication ([place]) 46/56/8127    Past Medical History:  Diagnosis Date   Colon polyps    Depression    Diabetes mellitus    DVT (deep venous thrombosis) (HCC)    Left Leg post Fx, 1979   Headache(784.0)    migraines   Hepatitis    dx in 8th grade-not sure what type, can not donate blood.   History of chicken pox    childhood   History of kidney stones    no surgery required   Hyperlipidemia    Increased pressure in the eye    Rectal bleeding    SVD (spontaneous vaginal delivery)    x 1   UTI (lower urinary tract infection)     Past Surgical History:  Procedure Laterality Date   ABDOMINAL HYSTERECTOMY  2000   ANTERIOR AND POSTERIOR REPAIR  09/13/2012   Procedure: ANTERIOR (CYSTOCELE) AND POSTERIOR REPAIR (RECTOCELE);  Surgeon: Gus Height, MD;  Location: [facility];  Service: Gynecology;  Laterality: N/A;   BREAST EXCISIONAL BIOPSY     BREAST SURGERY  1994   Left lumpectomy x 3 -  [facility]   COLONOSCOPY  08/2012   at [facility]   TONSILLECTOMY  WISDOM TOOTH EXTRACTION      Social History   Tobacco Use   Smoking status: Former    Pack years: 0.00    Types: Cigarettes   Smokeless tobacco: Never   Tobacco comments:    Only when teenager for 1.5 years  Substance Use Topics   Alcohol use: Yes    Alcohol/week: 3.0 standard drinks    Types: 3 Cans of beer per week    Comment: socially   Drug use: No    Family History  Problem Relation Age of Onset   Arthritis/Rheumatoid Father 48       Deceased   Colon cancer Father    Heart disease Father    Hypertension Father    Diabetes Father    Hyperlipidemia Mother        Living   Glaucoma Mother    Sarcoidosis Mother    Ovarian cancer Maternal Grandmother    Breast cancer Maternal Grandmother     Thrombocytopenia Brother        #1   Leukemia Brother        #1   Hyperlipidemia Brother        #2   Sleep apnea Brother        #2   Sudden death Maternal Uncle    Heart attack Maternal Uncle    Depression Son     Allergies  Allergen Reactions   Penicillins     As a child - can't remember   Metformin And Related Nausea And Vomiting    Medication list has been reviewed and updated.  Current Outpatient Medications on File Prior to Visit  Medication Sig Dispense Refill   aspirin 81 MG tablet Take 81 mg by mouth daily.     atenolol (TENORMIN) 25 MG tablet Take 1 tablet (25 mg total) by mouth daily. 90 tablet 0   Biotin 10000 MCG TABS Take 10,000 mcg by mouth daily.     Cholecalciferol (VITAMIN D3) 2000 units TABS Take 2,000 Units by mouth.     cycloSPORINE (RESTASIS) 0.05 % ophthalmic emulsion 1 drop 2 (two) times daily.     empagliflozin (JARDIANCE) 25 MG TABS tablet Take 1 tablet (25 mg total) by mouth daily before breakfast. 90 tablet 3   glipiZIDE (GLUCOTROL XL) 5 MG 24 hr tablet Take 1 tablet (5 mg total) by mouth daily with breakfast. 90 tablet 0   Misc Natural Products (GLUCOSAMINE CHOND CMP ADVANCED) TABS      Multiple Vitamins-Minerals (CENTRUM SILVER 50+WOMEN) TABS Take 1 tablet by mouth daily.     rosuvastatin (CRESTOR) 10 MG tablet Take 1 tablet (10 mg total) by mouth daily. 90 tablet 1   SUMAtriptan (IMITREX) 100 MG tablet Take 1 tab by mouth daily as needed for migraine. 9 tablet 6   No current facility-administered medications on file prior to visit.    Review of Systems:  As per HPI- otherwise negative.   Physical Examination: Vitals:   06/18/21 0915  BP: 112/68  Pulse: 77  Temp: 98.3 F (36.8 C)  SpO2: 97%   Vitals:   06/18/21 0915  Weight: 175 lb 6.4 oz (79.6 kg)  Height: 5\' 5"  (1.651 m)   Body mass index is 29.19 kg/m. Ideal Body Weight: Weight in (lb) to have BMI = 25: 149.9  GEN: no acute distress.  Mild overweight, looks well  HEENT:  Atraumatic, Normocephalic.  Bilateral TM wnl, oropharynx normal.  PEERL,EOMI.   Ears and Nose: No external deformity. CV: RRR, No  M/G/R. No JVD. No thrill. No extra heart sounds. PULM: CTA B, no wheezes, crackles, rhonchi. No retractions. No resp. distress. No accessory muscle use. ABD: S, NT, ND, +BS. No rebound. No HSM. EXTR: No c/c/e PSYCH: Normally interactive. Conversant.  Right thumb: There is a small nodule at the radial aspect of the IP joint.  This is tender.  Range of motion is slightly decreased.  No heat or redness.  Assessment and Plan: Physical exam  Controlled type 2 diabetes mellitus without complication, without long-term current use of insulin ([employer]) - Plan: Comprehensive metabolic panel, Hemoglobin A1c, Microalbumin / creatinine urine ratio  Vitamin D deficiency - Plan: VITAMIN D 25 Hydroxy (Vit-D Deficiency, Fractures)  Screening for deficiency anemia - Plan: CBC  Dyslipidemia - Plan: Lipid panel, rosuvastatin (CRESTOR) 10 MG tablet  Migraine without aura and without status migrainosus, not intractable  Screening for thyroid disorder - Plan: TSH  Pain of right thumb - Plan: Sedimentation rate, C-reactive protein, Rheumatoid Factor, DG Hand Complete Right  Estrogen deficiency - Plan: DG Bone Density  Screening mammogram for breast cancer - Plan: MM 3D SCREEN BREAST BILATERAL  Patient seen today for physical exam Encouraged healthy diet, she is already getting plenty of exercise Discussed health maintenance, ordered bone density and mammogram Routine labs are pending as above She notes an issue with her thumb, her father has rheumatoid arthritis.  I will obtain basic screening labs for rheumatoid arthritis, also ordered an x-ray  She would like to try tapering off atenolol which is fine.  She will decrease to half tablet per day, if symptoms are well controlled she may stop entirely She is already on the lowest dose- 25 mg  Assuming her A1c is normal she would  also like to stop her glipizide which is reasonable  Will plan further follow- up pending labs. This visit occurred during the SARS-CoV-2 public health emergency.  Safety protocols were in place, including screening questions prior to the visit, additional usage of staff PPE, and extensive cleaning of exam room while observing appropriate contact time as indicated for disinfecting solutions.   Signed Lamar Blinks, MD  Received labs 7/13, message to patient  Results for orders placed or performed in visit on 06/18/21  CBC  Result Value Ref Range   WBC 5.4 4.0 - 10.5 K/uL   RBC 4.81 3.87 - 5.11 Mil/uL   Platelets 221.0 150.0 - 400.0 K/uL   Hemoglobin 15.1 (H) 12.0 - 15.0 g/dL   HCT 43.8 36.0 - 46.0 %   MCV 91.1 78.0 - 100.0 fl   MCHC 34.4 30.0 - 36.0 g/dL   RDW 13.0 11.5 - 15.5 %  Comprehensive metabolic panel  Result Value Ref Range   Sodium 140 135 - 145 mEq/L   Potassium 4.3 3.5 - 5.1 mEq/L   Chloride 104 96 - 112 mEq/L   CO2 29 19 - 32 mEq/L   Glucose, Bld 136 (H) 70 - 99 mg/dL   BUN 17 6 - 23 mg/dL   Creatinine, Ser 0.58 0.40 - 1.20 mg/dL   Total Bilirubin 0.8 0.2 - 1.2 mg/dL   Alkaline Phosphatase 66 39 - 117 U/L   AST 21 0 - 37 U/L   ALT 18 0 - 35 U/L   Total Protein 7.0 6.0 - 8.3 g/dL   Albumin 4.5 3.5 - 5.2 g/dL   GFR 94.54 >60.00 mL/min   Calcium 9.2 8.4 - 10.5 mg/dL  Hemoglobin A1c  Result Value Ref Range   Hgb A1c  MFr Bld 6.5 4.6 - 6.5 %  Lipid panel  Result Value Ref Range   Cholesterol 186 0 - 200 mg/dL   Triglycerides 85.0 0.0 - 149.0 mg/dL   HDL 75.00 >39.00 mg/dL   VLDL 17.0 0.0 - 40.0 mg/dL   LDL Cholesterol 94 0 - 99 mg/dL   Total CHOL/HDL Ratio 2    NonHDL 110.81   TSH  Result Value Ref Range   TSH 2.35 0.35 - 5.50 uIU/mL  VITAMIN D 25 Hydroxy (Vit-D Deficiency, Fractures)  Result Value Ref Range   VITD 56.04 30.00 - 100.00 ng/mL  Microalbumin / creatinine urine ratio  Result Value Ref Range   Microalb, Ur <0.7 0.0 - 1.9 mg/dL    Creatinine,U 50.7 mg/dL   Microalb Creat Ratio 1.4 0.0 - 30.0 mg/g  Sedimentation rate  Result Value Ref Range   Sed Rate 6 0 - 30 mm/hr  C-reactive protein  Result Value Ref Range   CRP <1.0 0.5 - 20.0 mg/dL

## 2021-06-18 ENCOUNTER — Other Ambulatory Visit: Payer: Self-pay

## 2021-06-18 ENCOUNTER — Encounter: Payer: Self-pay | Admitting: Family Medicine

## 2021-06-18 ENCOUNTER — Ambulatory Visit: Payer: PRIVATE HEALTH INSURANCE | Admitting: Family Medicine

## 2021-06-18 VITALS — BP 112/68 | HR 77 | Temp 98.3°F | Ht 65.0 in | Wt 175.4 lb

## 2021-06-18 DIAGNOSIS — G43009 Migraine without aura, not intractable, without status migrainosus: Secondary | ICD-10-CM

## 2021-06-18 DIAGNOSIS — Z130 Encounter for screening for diseases of the blood and blood-forming organs and certain disorders involving the immune mechanism: Secondary | ICD-10-CM | POA: Diagnosis not present

## 2021-06-18 DIAGNOSIS — Z1329 Encounter for screening for other suspected endocrine disorder: Secondary | ICD-10-CM

## 2021-06-18 DIAGNOSIS — Z0000 Encounter for general adult medical examination without abnormal findings: Secondary | ICD-10-CM

## 2021-06-18 DIAGNOSIS — E559 Vitamin D deficiency, unspecified: Secondary | ICD-10-CM

## 2021-06-18 DIAGNOSIS — M79644 Pain in right finger(s): Secondary | ICD-10-CM | POA: Diagnosis not present

## 2021-06-18 DIAGNOSIS — E785 Hyperlipidemia, unspecified: Secondary | ICD-10-CM

## 2021-06-18 DIAGNOSIS — E2839 Other primary ovarian failure: Secondary | ICD-10-CM

## 2021-06-18 DIAGNOSIS — E119 Type 2 diabetes mellitus without complications: Secondary | ICD-10-CM | POA: Diagnosis not present

## 2021-06-18 DIAGNOSIS — Z1231 Encounter for screening mammogram for malignant neoplasm of breast: Secondary | ICD-10-CM

## 2021-06-18 LAB — COMPREHENSIVE METABOLIC PANEL
ALT: 18 U/L (ref 0–35)
AST: 21 U/L (ref 0–37)
Albumin: 4.5 g/dL (ref 3.5–5.2)
Alkaline Phosphatase: 66 U/L (ref 39–117)
BUN: 17 mg/dL (ref 6–23)
CO2: 29 mEq/L (ref 19–32)
Calcium: 9.2 mg/dL (ref 8.4–10.5)
Chloride: 104 mEq/L (ref 96–112)
Creatinine, Ser: 0.58 mg/dL (ref 0.40–1.20)
GFR: 94.54 mL/min (ref >60.00)
Glucose, Bld: 136 mg/dL — ABNORMAL HIGH (ref 70–99)
Potassium: 4.3 mEq/L (ref 3.5–5.1)
Sodium: 140 mEq/L (ref 135–145)
Total Bilirubin: 0.8 mg/dL (ref 0.2–1.2)
Total Protein: 7 g/dL (ref 6.0–8.3)

## 2021-06-18 LAB — LIPID PANEL
Cholesterol: 186 mg/dL (ref 0–200)
HDL: 75 mg/dL (ref >39.00)
LDL Cholesterol: 94 mg/dL (ref 0–99)
NonHDL: 110.81
Total CHOL/HDL Ratio: 2
Triglycerides: 85 mg/dL (ref 0.0–149.0)
VLDL: 17 mg/dL (ref 0.0–40.0)

## 2021-06-18 LAB — C-REACTIVE PROTEIN: CRP: <1 mg/dL (ref 0.5–20.0)

## 2021-06-18 LAB — CBC
HCT: 43.8 % (ref 36.0–46.0)
Hemoglobin: 15.1 g/dL — ABNORMAL HIGH (ref 12.0–15.0)
MCHC: 34.4 g/dL (ref 30.0–36.0)
MCV: 91.1 fl (ref 78.0–100.0)
Platelets: 221 10*3/uL (ref 150.0–400.0)
RBC: 4.81 Mil/uL (ref 3.87–5.11)
RDW: 13 % (ref 11.5–15.5)
WBC: 5.4 10*3/uL (ref 4.0–10.5)

## 2021-06-18 LAB — SEDIMENTATION RATE: Sed Rate: 6 mm/hr (ref 0–30)

## 2021-06-18 LAB — MICROALBUMIN / CREATININE URINE RATIO
Creatinine,U: 50.7 mg/dL
Microalb Creat Ratio: 1.4 mg/g (ref 0.0–30.0)
Microalb, Ur: <0.7 mg/dL (ref 0.0–1.9)

## 2021-06-18 LAB — HEMOGLOBIN A1C: Hgb A1c MFr Bld: 6.5 % (ref 4.6–6.5)

## 2021-06-18 LAB — VITAMIN D 25 HYDROXY (VIT D DEFICIENCY, FRACTURES): VITD: 56.04 ng/mL (ref 30.00–100.00)

## 2021-06-18 LAB — TSH: TSH: 2.35 u[IU]/mL (ref 0.35–5.50)

## 2021-06-18 MED ORDER — ROSUVASTATIN CALCIUM 10 MG PO TABS: 10.0000 mg | ORAL_TABLET | Freq: Every day | ORAL | 3 refills | Status: DC

## 2021-06-18 NOTE — Addendum Note (Signed)
Addended by: Lamar Blinks C on: 06/18/2021 03:26 PM   Modules accepted: Orders

## 2021-06-18 NOTE — Patient Instructions (Signed)
Good to see you again today- I will be in touch with your labs asap We ordered a bone density and mammo to be done in September/ October Please walk in to [facility] later on this week to have an x-ray of your right hand  Ok to halve your atenolol dose- let me know how you respond.  Assuming your BP stays under 130/85 ok to stop taking atenolol We will check your A1c but I suspect ok to stop glipizide

## 2021-06-19 LAB — RHEUMATOID FACTOR: Rheumatoid fact SerPl-aCnc: <14 IU/mL (ref <14)

## 2021-06-20 ENCOUNTER — Ambulatory Visit
Admission: RE | Admit: 2021-06-20 | Discharge: 2021-06-20 | Disposition: A | Discharge status: Home / Self Care | Payer: PRIVATE HEALTH INSURANCE | Source: Ambulatory Visit | Attending: Family Medicine | Admitting: Family Medicine

## 2021-06-20 DIAGNOSIS — M79644 Pain in right finger(s): Secondary | ICD-10-CM

## 2021-06-23 ENCOUNTER — Encounter: Payer: Self-pay | Admitting: Family Medicine

## 2021-09-07 ENCOUNTER — Other Ambulatory Visit: Payer: Self-pay | Admitting: Family Medicine

## 2021-09-07 DIAGNOSIS — R002 Palpitations: Secondary | ICD-10-CM

## 2021-09-07 DIAGNOSIS — E119 Type 2 diabetes mellitus without complications: Secondary | ICD-10-CM

## 2021-09-20 ENCOUNTER — Encounter: Payer: Self-pay | Admitting: Family Medicine

## 2021-09-20 DIAGNOSIS — Z780 Asymptomatic menopausal state: Secondary | ICD-10-CM

## 2021-09-29 ENCOUNTER — Other Ambulatory Visit (HOSPITAL_BASED_OUTPATIENT_CLINIC_OR_DEPARTMENT_OTHER): Payer: Self-pay | Admitting: Family Medicine

## 2021-09-29 DIAGNOSIS — Z1231 Encounter for screening mammogram for malignant neoplasm of breast: Secondary | ICD-10-CM

## 2021-09-30 ENCOUNTER — Encounter: Payer: Self-pay | Admitting: Family Medicine

## 2021-09-30 ENCOUNTER — Ambulatory Visit (HOSPITAL_BASED_OUTPATIENT_CLINIC_OR_DEPARTMENT_OTHER)
Admission: RE | Admit: 2021-09-30 | Discharge: 2021-09-30 | Disposition: A | Discharge status: Home / Self Care | Payer: PRIVATE HEALTH INSURANCE | Source: Ambulatory Visit | Attending: Family Medicine | Admitting: Family Medicine

## 2021-09-30 ENCOUNTER — Encounter (HOSPITAL_BASED_OUTPATIENT_CLINIC_OR_DEPARTMENT_OTHER): Payer: Self-pay

## 2021-09-30 ENCOUNTER — Telehealth: Payer: Self-pay | Admitting: Gastroenterology

## 2021-09-30 ENCOUNTER — Other Ambulatory Visit: Payer: Self-pay

## 2021-09-30 DIAGNOSIS — Z1231 Encounter for screening mammogram for malignant neoplasm of breast: Secondary | ICD-10-CM | POA: Diagnosis present

## 2021-09-30 DIAGNOSIS — Z1211 Encounter for screening for malignant neoplasm of colon: Secondary | ICD-10-CM

## 2021-09-30 NOTE — Telephone Encounter (Signed)
Hey Dr Lyndel Safe, this pt is being referred to have a colonoscopy but it looks like the pt had one done in 2013, report is in epic for review, please advise on scheduling, pt states she has family history of colon cancer and would like to do it before 2023.

## 2021-09-30 NOTE — Telephone Encounter (Signed)
Prev colon reviewed. Pl proceed with colon directly with me RG

## 2021-10-03 ENCOUNTER — Other Ambulatory Visit: Payer: Self-pay | Admitting: Family Medicine

## 2021-10-03 DIAGNOSIS — R928 Other abnormal and inconclusive findings on diagnostic imaging of breast: Secondary | ICD-10-CM

## 2021-10-08 ENCOUNTER — Encounter: Payer: Self-pay | Admitting: Family Medicine

## 2021-10-09 NOTE — Telephone Encounter (Signed)
Order requisitions faxed to [phone], transmission successful.

## 2021-10-14 ENCOUNTER — Other Ambulatory Visit: Payer: Self-pay

## 2021-10-14 ENCOUNTER — Encounter: Payer: Self-pay | Admitting: Family Medicine

## 2021-10-14 ENCOUNTER — Ambulatory Visit (HOSPITAL_BASED_OUTPATIENT_CLINIC_OR_DEPARTMENT_OTHER)
Admission: RE | Admit: 2021-10-14 | Discharge: 2021-10-14 | Disposition: A | Discharge status: Home / Self Care | Payer: PRIVATE HEALTH INSURANCE | Source: Ambulatory Visit | Attending: Family Medicine | Admitting: Family Medicine

## 2021-10-14 DIAGNOSIS — Z780 Asymptomatic menopausal state: Secondary | ICD-10-CM | POA: Diagnosis present

## 2021-10-14 DIAGNOSIS — M8580 Other specified disorders of bone density and structure, unspecified site: Secondary | ICD-10-CM | POA: Insufficient documentation

## 2021-10-20 ENCOUNTER — Encounter: Payer: Self-pay | Admitting: Gastroenterology

## 2021-12-08 ENCOUNTER — Other Ambulatory Visit: Payer: Self-pay | Admitting: Family Medicine

## 2021-12-08 DIAGNOSIS — R002 Palpitations: Secondary | ICD-10-CM

## 2021-12-08 DIAGNOSIS — E119 Type 2 diabetes mellitus without complications: Secondary | ICD-10-CM

## 2021-12-12 ENCOUNTER — Ambulatory Visit (AMBULATORY_SURGERY_CENTER): Payer: Medicare Other

## 2021-12-12 ENCOUNTER — Other Ambulatory Visit: Payer: Self-pay

## 2021-12-12 VITALS — Ht 64.0 in | Wt 171.0 lb

## 2021-12-12 DIAGNOSIS — Z86010 Personal history of colonic polyps: Secondary | ICD-10-CM

## 2021-12-12 DIAGNOSIS — Z800 Family history of malignant neoplasm of digestive organs: Secondary | ICD-10-CM

## 2021-12-12 NOTE — Progress Notes (Signed)
Pre visit completed via phone call; Patient verified name, DOB, and address; No egg or soy allergy known to patient  No issues known to pt with past sedation with any surgeries or procedures Patient denies ever being told they had issues or difficulty with intubation  No FH of Malignant Hyperthermia Pt is not on diet pills Pt is not on home 02  Pt is not on blood thinners  Pt denies issues with constipation; No A fib or A flutter Pt is fully vaccinated for Covid x 2 + boosters; NO PA's for preps discussed with pt in PV today  Discussed with pt there will be an out-of-pocket cost for prep and that varies from $0 to 70 + dollars - pt verbalized understanding  Due to the COVID-19 pandemic we are asking patients to follow certain guidelines in PV and the [facility]   Pt aware of COVID protocols and [place] guidelines   Patient was given the options for preps-Clenpiq, Sutab, Golytely, and Miralax; Miralax prep requested;

## 2021-12-19 ENCOUNTER — Encounter: Payer: Self-pay | Admitting: Gastroenterology

## 2021-12-26 ENCOUNTER — Other Ambulatory Visit: Payer: Self-pay

## 2021-12-26 ENCOUNTER — Ambulatory Visit (AMBULATORY_SURGERY_CENTER): Payer: Medicare Other | Admitting: Gastroenterology

## 2021-12-26 ENCOUNTER — Encounter: Payer: Self-pay | Admitting: Gastroenterology

## 2021-12-26 VITALS — BP 115/69 | HR 83 | Temp 97.3°F | Resp 10 | Ht 64.0 in | Wt 171.0 lb

## 2021-12-26 DIAGNOSIS — Z800 Family history of malignant neoplasm of digestive organs: Secondary | ICD-10-CM | POA: Diagnosis not present

## 2021-12-26 DIAGNOSIS — Z86010 Personal history of colonic polyps: Secondary | ICD-10-CM | POA: Diagnosis not present

## 2021-12-26 MED ORDER — SODIUM CHLORIDE 0.9 % IV SOLN: 500.0000 mL | Freq: Once | INTRAVENOUS | Status: DC

## 2021-12-26 NOTE — Progress Notes (Signed)
Pt's states no medical or surgical changes since previsit or office visit. VS by Cw

## 2021-12-26 NOTE — Progress Notes (Signed)
PT taken to PACU. Monitors in place. VSS. Report given to RN. 

## 2021-12-26 NOTE — Op Note (Signed)
[name] Patient Name: Laurie Hernandez Procedure Date: 12/26/2021 8:43 AM MRN: [id number] Endoscopist: Jackquline Denmark , MD Age: 66 Referring MD:  Date of Birth: 29-Aug-1956 Gender: Female Account #: [id number] Procedure:                Colonoscopy Indications:              Screening in patient at increased risk: Colorectal                            cancer in father Stage IV (age >79), High risk                            colon cancer surveillance: Personal history of                            colonic polyps Medicines:                Monitored Anesthesia Care Procedure:                Pre-Anesthesia Assessment:                           - Prior to the procedure, a History and Physical                            was performed, and patient medications and                            allergies were reviewed. The patient's tolerance of                            previous anesthesia was also reviewed. The risks                            and benefits of the procedure and the sedation                            options and risks were discussed with the patient.                            All questions were answered, and informed consent                            was obtained. Prior Anticoagulants: The patient has                            taken no previous anticoagulant or antiplatelet                            agents. ASA Grade Assessment: II - A patient with                            mild systemic disease. After reviewing the risks  and benefits, the patient was deemed in                            satisfactory condition to undergo the procedure.                           After obtaining informed consent, the colonoscope                            was passed under direct vision. Throughout the                            procedure, the patient's blood pressure, pulse, and                            oxygen saturations were monitored continuously. The                             Olympus PCF-H190DL (#[id number]) Colonoscope was                            introduced through the anus and advanced to the 2                            cm into the ileum. The colonoscopy was performed                            without difficulty. The patient tolerated the                            procedure well. The quality of the bowel                            preparation was good. The terminal ileum, ileocecal                            valve, appendiceal orifice, and rectum were                            photographed. Scope In: 8:50:58 AM Scope Out: 9:08:10 AM Scope Withdrawal Time: 0 hours 11 minutes 45 seconds  Total Procedure Duration: 0 hours 17 minutes 12 seconds  Findings:                 A few medium-mouthed diverticula were found in the                            sigmoid colon.                           Non-bleeding external and internal hemorrhoids were                            found during retroflexion and during perianal exam.  The hemorrhoids were small and Grade I (internal                            hemorrhoids that do not prolapse).                           The terminal ileum appeared normal.                           The exam was otherwise without abnormality on                            direct and retroflexion views. The colon was highly                            redundant. Complications:            No immediate complications. Estimated Blood Loss:     Estimated blood loss: none. Impression:               - Mild sigmoid diverticulosis.                           - Non-bleeding external and internal hemorrhoids.                           - The examined portion of the ileum was normal.                           - The examination was otherwise normal on direct                            and retroflexion views.                           - No specimens collected. Recommendation:           - Patient has a  contact number available for                            emergencies. The signs and symptoms of potential                            delayed complications were discussed with the                            patient. Return to normal activities tomorrow.                            Written discharge instructions were provided to the                            patient.                           - Resume previous diet.                           -  Continue present medications.                           - Repeat colonoscopy in 5 years for screening                            purposes. Earlier, if with any new problems or                            change in family history.                           - The findings and recommendations were discussed                            with the patient's family. Jackquline Denmark, MD 12/26/2021 9:13:04 AM This report has been signed electronically.

## 2021-12-26 NOTE — Patient Instructions (Signed)
Thank you for allowing [place] to care for you today! Resume previous diet and medications today.  Return to normal daily activities tomorrow, 12/27/21. Recommend next colonoscopy in 5 years.  Contact us if you have any issues or change in family history.    YOU HAD AN ENDOSCOPIC PROCEDURE TODAY AT [facility]:   Refer to the procedure report that was given to you for any specific questions about what was found during the examination.  If the procedure report does not answer your questions, please call your gastroenterologist to clarify.  If you requested that your care partner not be given the details of your procedure findings, then the procedure report has been included in a sealed envelope for you to review at your convenience later.  YOU SHOULD EXPECT: Some feelings of bloating in the abdomen. Passage of more gas than usual.  Walking can help get rid of the air that was put into your GI tract during the procedure and reduce the bloating. If you had a lower endoscopy (such as a colonoscopy or flexible sigmoidoscopy) you may notice spotting of blood in your stool or on the toilet paper. If you underwent a bowel prep for your procedure, you may not have a normal bowel movement for a few days.  Please Note:  You might notice some irritation and congestion in your nose or some drainage.  This is from the oxygen used during your procedure.  There is no need for concern and it should clear up in a day or so.  SYMPTOMS TO REPORT IMMEDIATELY:  Following lower endoscopy (colonoscopy or flexible sigmoidoscopy):  Excessive amounts of blood in the stool  Significant tenderness or worsening of abdominal pains  Swelling of the abdomen that is new, acute  Fever of 100F or higher   For urgent or emergent issues, a gastroenterologist can be reached at any hour by calling [phone]. Do not use MyChart messaging for urgent concerns.    DIET:  We do recommend a small meal at first, but then  you may proceed to your regular diet.  Drink plenty of fluids but you should avoid alcoholic beverages for 24 hours.  ACTIVITY:  You should plan to take it easy for the rest of today and you should NOT DRIVE or use heavy machinery until tomorrow (because of the sedation medicines used during the test).    FOLLOW UP: Our staff will call the number listed on your records 48-72 hours following your procedure to check on you and address any questions or concerns that you may have regarding the information given to you following your procedure. If we do not reach you, we will leave a message.  We will attempt to reach you two times.  During this call, we will ask if you have developed any symptoms of COVID 19. If you develop any symptoms (ie: fever, flu-like symptoms, shortness of breath, cough etc.) before then, please call [phone].  If you test positive for Covid 19 in the 2 weeks post procedure, please call and report this information to [place].    If any biopsies were taken you will be contacted by phone or by letter within the next 1-3 weeks.  Please call us at [phone] if you have not heard about the biopsies in 3 weeks.    SIGNATURES/CONFIDENTIALITY: You and/or your care partner have signed paperwork which will be entered into your electronic medical record.  These signatures attest to the fact that that the information above  on your After Visit Summary has been reviewed and is understood.  Full responsibility of the confidentiality of this discharge information lies with you and/or your care-partner.

## 2021-12-26 NOTE — Progress Notes (Signed)
[facility] History and Physical   Primary Care Physician:  Darreld Mclean, MD   Reason for Procedure:   H/O colon polyps. FH Colon Ca (dad with stage IV at age 67)  Plan:    Colon     HPI: Laurie Hernandez is a 66 y.o. female    Past Medical History:  Diagnosis Date   Cataract    not a surgical candidate at this time (12/12/2021)   Colon polyps    Depression 2016   hx of-situational depression   Diabetes mellitus    on meds   DVT (deep venous thrombosis) ([place]) 1979   post Left Leg Fx   Headache(784.0)    migraines   Hepatitis    dx in 8th grade-not sure what type, can not donate blood.   History of chicken pox    childhood   History of kidney stones    no surgery required   Hyperlipidemia    on meds   Increased pressure in the eye    Rectal bleeding    SVD (spontaneous vaginal delivery)    x 1   UTI (lower urinary tract infection)     Past Surgical History:  Procedure Laterality Date   ABDOMINAL HYSTERECTOMY  2000   ANTERIOR AND POSTERIOR REPAIR  09/13/2012   Procedure: ANTERIOR (CYSTOCELE) AND POSTERIOR REPAIR (RECTOCELE);  Surgeon: Gus Height, MD;  Location: [facility];  Service: Gynecology;  Laterality: N/A;   BREAST EXCISIONAL BIOPSY     BREAST SURGERY  1994   Left lumpectomy x 3 -  benigh   COLONOSCOPY  08/2012   @ [facility]-with Dr.Outlaw-MAC-good prep-TA   KNEE CARTILAGE SURGERY Left 2018   KNEE CARTILAGE SURGERY Left 2021   repair of meniscus repeat tear   TONSILLECTOMY     WISDOM TOOTH EXTRACTION      Prior to Admission medications   Medication Sig Start Date End Date Taking? Authorizing Provider  aspirin 81 MG tablet Take 81 mg by mouth daily.   Yes [provider]  atenolol (TENORMIN) 25 MG tablet Take 1 tablet (25 mg total) by mouth daily. Due for appt 12/22 12/08/21  Yes Copland, Gay Filler, MD  Biotin 10000 MCG TABS Take 10,000 mcg by mouth daily.   Yes [provider]  Cholecalciferol (VITAMIN D3) 2000 units  TABS Take 2,000 Units by mouth.   Yes [provider]  cycloSPORINE (RESTASIS) 0.05 % ophthalmic emulsion Place 1 drop into both eyes 2 (two) times daily.   Yes [provider]  glipiZIDE (GLUCOTROL XL) 5 MG 24 hr tablet Take 1 tablet (5 mg total) by mouth daily with breakfast. Due for visit 12/22 12/08/21  Yes Copland, Gay Filler, MD  JARDIANCE 25 MG TABS tablet Take 1 tablet by mouth daily before breakfast. 12/08/21  Yes Copland, Gay Filler, MD  Misc Natural Products (GLUCOSAMINE CHOND CMP ADVANCED PO) Take 1 Dose by mouth daily at 6 (six) AM. 01/07/21  Yes [provider]  Multiple Vitamins-Minerals (CENTRUM SILVER 50+WOMEN) TABS Take 1 tablet by mouth daily. 09/19/17  Yes [provider]  rosuvastatin (CRESTOR) 10 MG tablet Take 1 tablet (10 mg total) by mouth daily. 06/18/21  Yes Copland, Gay Filler, MD  SUMAtriptan (IMITREX) 100 MG tablet Take 1 tab by mouth daily as needed for migraine. 11/29/19   Copland, Gay Filler, MD    Current Outpatient Medications  Medication Sig Dispense Refill   aspirin 81 MG tablet Take 81 mg by mouth daily.  atenolol (TENORMIN) 25 MG tablet Take 1 tablet (25 mg total) by mouth daily. Due for appt 12/22 90 tablet 0   Biotin 10000 MCG TABS Take 10,000 mcg by mouth daily.     Cholecalciferol (VITAMIN D3) 2000 units TABS Take 2,000 Units by mouth.     cycloSPORINE (RESTASIS) 0.05 % ophthalmic emulsion Place 1 drop into both eyes 2 (two) times daily.     glipiZIDE (GLUCOTROL XL) 5 MG 24 hr tablet Take 1 tablet (5 mg total) by mouth daily with breakfast. Due for visit 12/22 90 tablet 0   JARDIANCE 25 MG TABS tablet Take 1 tablet by mouth daily before breakfast. 90 tablet 0   Misc Natural Products (GLUCOSAMINE CHOND CMP ADVANCED PO) Take 1 Dose by mouth daily at 6 (six) AM.     Multiple Vitamins-Minerals (CENTRUM SILVER 50+WOMEN) TABS Take 1 tablet by mouth daily.     rosuvastatin (CRESTOR) 10 MG tablet Take 1 tablet (10 mg total) by mouth  daily. 90 tablet 3   SUMAtriptan (IMITREX) 100 MG tablet Take 1 tab by mouth daily as needed for migraine. 9 tablet 6   Current Facility-Administered Medications  Medication Dose Route Frequency Provider Last Rate Last Admin   0.9 %  sodium chloride infusion  500 mL Intravenous Once Jackquline Denmark, MD        Allergies as of 12/26/2021 - Review Complete 12/26/2021  Allergen Reaction Noted   Penicillins Rash 08/16/2012   Metformin and related Nausea And Vomiting 04/30/2015    Family History  Problem Relation Age of Onset   Hyperlipidemia Mother        Living   Glaucoma Mother    Sarcoidosis Mother    Colon polyps Father 61   Arthritis/Rheumatoid Father 16       Deceased   Colon cancer Father 44   Heart disease Father    Hypertension Father    Diabetes Father    Thrombocytopenia Brother        #1   Leukemia Brother        #1   Hyperlipidemia Brother        #2   Sleep apnea Brother        #2   Sudden death Maternal Uncle    Heart attack Maternal Uncle    Ovarian cancer Maternal Grandmother    Breast cancer Maternal Grandmother    Depression Son     Social History   Socioeconomic History   Marital status: Married    Spouse name: Not on file   Number of children: Not on file   Years of education: Not on file   Highest education level: Not on file  Occupational History   Not on file  Tobacco Use   Smoking status: Former    Types: Cigarettes   Smokeless tobacco: Never   Tobacco comments:    Only when teenager for 1.5 years  Vaping Use   Vaping Use: Never used  Substance and Sexual Activity   Alcohol use: Not Currently    Alcohol/week: 0.0 - 3.0 standard drinks    Comment: socially   Drug use: No   Sexual activity: Yes    Birth control/protection: None    Comment: hysterectomy  Other Topics Concern   Not on file  Social History Narrative   Not on file   Social Determinants of Health   Financial Resource Strain: Not on file  Food Insecurity: Not on  file  Transportation Needs: Not on file  Physical  Activity: Not on file  Stress: Not on file  Social Connections: Not on file  Intimate Partner Violence: Not on file    Review of Systems: Positive for none All other review of systems negative except as mentioned in the HPI.  Physical Exam: Vital signs in last 24 hours: _0 @   General:   Alert,  Well-developed, well-nourished, pleasant and cooperative in NAD Lungs:  Clear throughout to auscultation.   Heart:  Regular rate and rhythm; no murmurs, clicks, rubs,  or gallops. Abdomen:  Soft, nontender and nondistended. Normal bowel sounds.   Neuro/Psych:  Alert and cooperative. Normal mood and affect. A and O x 3    No significant changes were identified.  The patient continues to be an appropriate candidate for the planned procedure and anesthesia.   Carmell Austria, MD. [facility] 12/26/2021 8:43 AM@

## 2021-12-30 ENCOUNTER — Telehealth: Payer: Self-pay

## 2021-12-30 NOTE — Telephone Encounter (Signed)
°  Follow up Call-  Call back number 12/26/2021  Post procedure Call Back phone  # [phone]  Permission to leave phone message Yes  Some recent data might be hidden     Patient questions:  Do you have a fever, pain , or abdominal swelling? No. Pain Score  0 *  Have you tolerated food without any problems? Yes.    Have you been able to return to your normal activities? Yes.    Do you have any questions about your discharge instructions: Diet   No. Medications  No. Follow up visit  No.  Do you have questions or concerns about your Care? No.  Actions: * If pain score is 4 or above: No action needed, pain <4.   Have you developed a fever since your procedure? no  2.   Have you had an respiratory symptoms (SOB or cough) since your procedure? no  3.   Have you tested positive for COVID 19 since your procedure no  4.   Have you had any family members/close contacts diagnosed with the COVID 19 since your procedure?  no   If yes to any of these questions please route to Joylene John, RN and Joella Prince, RN

## 2022-03-04 LAB — HM DIABETES EYE EXAM

## 2022-03-05 NOTE — Patient Instructions (Addendum)
It was great to see you again today, I will be in touch with your labs as soon as possible  ?If you would like to come off atenolol I think that is fine.  I would recommend taking 1/2 tablet for 1 week, then you can stop ?Continue Jardiance for now, we will check on your A1c and I can refill your glipizide if needed ? ?We will get an US of the mass on your left leg- likely lipoma ?Can get a general surgery consult for possible removal if you like  ?

## 2022-03-05 NOTE — Progress Notes (Addendum)
Therapist, music at [facility] ?[address], [address] ?[place], [place] [address] ?336 [phone] ?Fax [phone] ? ?Date:  03/11/2022  ? ?Name:  Laurie Hernandez   DOB:  09-02-1956   MRN:  [id number] ? ?PCP:  Darreld Mclean, MD  ? ? ?Chief Complaint: Annual Exam (Concerns/ questions: pt has what she thinks is a lipoma on her left upper thigh that does hurt and has grown in size./Foot exam due today. Lurena Nida: pt has had Pneumo23) ? ? ?History of Present Illness: ? ?Laurie Hernandez is a 66 y.o. very pleasant female patient who presents with the following: ? ?Patient seen today for physical exam ?Most recent visit with myself July 2020- History of migraine headache, diabetes, vitamin D deficiency, kidney stones ?  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 ?Married to [place] ? ?Pneumonia booster- do today - ?COVID-19 booster- done Foot exam is due ?Can update lab work today- she is fasting today  ?Eye exam up-to-date ?Screening mammogram in October showed a possible left breast abnormality but I do not think she has followed up on this yet- she did go to [facility] to have this done in November ?Bone density is up-to-date ?S/p hyst over 20 years ago for benign disease  ? ?Last A1c showed excellent control of diabetes-she wanted to stop glipizide which is okay- she actually continued to take it however, along with her Invokana.  She wonders if she could potentially stop glipizide if her A1c still looks good ?Lab Results  ?Component Value Date  ? HGBA1C 6.5 06/18/2021  ? ?Aspirin 81 ?Atenolol?-She is taking, however she recalls this was started years ago by neurology for headaches.  Her blood pressure is on the low side, she would be interested in stopping this medication ?Jardiance 25 ?Crestor ? ? ?Patient Active Problem List  ? Diagnosis Date  Noted  ? Osteopenia 10/14/2021  ? History of vitamin D deficiency 06/27/2016  ? History of kidney stones 11/30/2015  ? Depression 01/20/2015  ? Migraine without aura and without status migrainosus, not intractable 01/20/2015  ? Type 2 diabetes mellitus without complication ([place]) 74/07/1447  ? ? ?Past Medical History:  ?Diagnosis Date  ? Cataract   ? not a surgical candidate at this time (12/12/2021)  ? Colon polyps   ? Depression 2016  ? hx of-situational depression  ? Diabetes mellitus   ? on meds  ? DVT (deep venous thrombosis) ([place]) 1979  ? post Left Leg Fx  ? Headache(784.0)   ? migraines  ? Hepatitis   ? dx in 8th grade-not sure what type, can not donate blood.  ? History of chicken pox   ? childhood  ? History of kidney stones   ? no surgery required  ? Hyperlipidemia   ? on meds  ? Increased pressure in the eye   ? Rectal bleeding   ? SVD (spontaneous vaginal delivery)   ? x 1  ? UTI (lower urinary tract infection)   ? ? ?Past Surgical History:  ?Procedure Laterality Date  ? ABDOMINAL HYSTERECTOMY  2000  ? ANTERIOR AND POSTERIOR REPAIR  09/13/2012  ? Procedure: ANTERIOR (CYSTOCELE) AND POSTERIOR REPAIR (RECTOCELE);  Surgeon: Gus Height, MD;  Location: [facility];  Service: Gynecology;  Laterality: N/A;  ? BREAST EXCISIONAL BIOPSY    ?  BREAST SURGERY  1994  ? Left lumpectomy x 3 -  benigh  ? COLONOSCOPY  08/2012  ? @ [place] Dr.Outlaw-MAC-good prep-TA  ? KNEE CARTILAGE SURGERY Left 2018  ? KNEE CARTILAGE SURGERY Left 2021  ? repair of meniscus repeat tear  ? TONSILLECTOMY    ? WISDOM TOOTH EXTRACTION    ? ? ?Social History  ? ?Tobacco Use  ? Smoking status: Former  ?  Types: Cigarettes  ? Smokeless tobacco: Never  ? Tobacco comments:  ?  Only when teenager for 1.5 years  ?Vaping Use  ? Vaping Use: Never used  ?Substance Use Topics  ? Alcohol use: Not Currently  ?  Alcohol/week: 0.0 - 3.0 standard drinks  ?  Comment: socially  ? Drug use: No  ? ? ?Family History  ?Problem Relation Age of Onset  ? Hyperlipidemia  Mother   ?     Living  ? Glaucoma Mother   ? Sarcoidosis Mother   ? Colon polyps Father 20  ? Arthritis/Rheumatoid Father 46  ?     Deceased  ? Colon cancer Father 38  ? Heart disease Father   ? Hypertension Father   ? Diabetes Father   ? Thrombocytopenia Brother   ?     #1  ? Leukemia Brother   ?     #1  ? Hyperlipidemia Brother   ?     #2  ? Sleep apnea Brother   ?     #2  ? Sudden death Maternal Uncle   ? Heart attack Maternal Uncle   ? Ovarian cancer Maternal Grandmother   ? Breast cancer Maternal Grandmother   ? Depression Son   ? ? ?Allergies  ?Allergen Reactions  ? Penicillins Rash  ?  As a child - can't remember  ? Metformin And Related Nausea And Vomiting  ? ? ?Medication list has been reviewed and updated. ? ?Current Outpatient Medications on File Prior to Visit  ?Medication Sig Dispense Refill  ? atenolol (TENORMIN) 25 MG tablet Take 1 tablet (25 mg total) by mouth daily. Due for appt 12/22 90 tablet 0  ? Biotin 10000 MCG TABS Take 10,000 mcg by mouth daily.    ? Cholecalciferol (VITAMIN D3) 2000 units TABS Take 2,000 Units by mouth.    ? cycloSPORINE (RESTASIS) 0.05 % ophthalmic emulsion Place 1 drop into both eyes 2 (two) times daily.    ? glipiZIDE (GLUCOTROL XL) 5 MG 24 hr tablet Take 1 tablet (5 mg total) by mouth daily with breakfast. Due for visit 12/22 90 tablet 0  ? JARDIANCE 25 MG TABS tablet Take 1 tablet by mouth daily before breakfast. 90 tablet 0  ? Misc Natural Products (GLUCOSAMINE CHOND CMP ADVANCED PO) Take 1 Dose by mouth daily at 6 (six) AM.    ? Multiple Vitamins-Minerals (CENTRUM SILVER 50+WOMEN) TABS Take 1 tablet by mouth daily.    ? rosuvastatin (CRESTOR) 10 MG tablet Take 1 tablet (10 mg total) by mouth daily. 90 tablet 3  ? SUMAtriptan (IMITREX) 100 MG tablet Take 1 tab by mouth daily as needed for migraine. 9 tablet 6  ? ?No current facility-administered medications on file prior to visit.  ? ? ?Review of Systems: ? ?As per HPI- otherwise negative. ?No CP or SOB with exercise   ? ?Physical Examination: ?Vitals:  ? 03/11/22 0917  ?BP: 110/60  ?Pulse: 73  ?Resp: 18  ?Temp: 98.4 ?F (36.9 ?C)  ?SpO2: 95%  ? ?Vitals:  ? 03/11/22 0917  ?  Weight: 165 lb 9.6 oz (75.1 kg)  ?Height: _0  (1.651 m)  ? ?Body mass index is 27.56 kg/m?. ?Ideal Body Weight: Weight in (lb) to have BMI = 25: 149.9 ? ?GEN: no acute distress.  Mildly overweight, looks well ?HEENT: Atraumatic, Normocephalic.  Bilateral TM wnl, oropharynx normal.  PEERL,EOMI.   ?Ears and Nose: No external deformity. ?CV: RRR, No M/G/R. No JVD. No thrill. No extra heart sounds. ?PULM: CTA B, no wheezes, crackles, rhonchi. No retractions. No resp. distress. No accessory muscle use. ?ABD: S, NT, ND, +BS. No rebound. No HSM. ?EXTR: No c/c/e ?PSYCH: Normally interactive. Conversant.  ?Foot exam- normal ?Left posterior upper thigh-likely subcutaneous lipoma-rounded, slightly mobile.  Approximately 4 cm in diameter ?Pt declines pelvic exam  ? ?Assessment and Plan: ?Physical exam ? ?Controlled type 2 diabetes mellitus without complication, without long-term current use of insulin ([place]) - Plan: Comprehensive metabolic panel, Hemoglobin A1c, empagliflozin (JARDIANCE) 25 MG TABS tablet ? ?Screening for deficiency anemia - Plan: CBC ? ?Screening for thyroid disorder - Plan: TSH ? ?Migraine without aura and without status migrainosus, not intractable - Plan: SUMAtriptan (IMITREX) 100 MG tablet ? ?Dyslipidemia - Plan: Lipid panel, rosuvastatin (CRESTOR) 10 MG tablet ? ?Immunization due - Plan: Pneumococcal conjugate vaccine 20-valent (Prevnar 20) ? ?Lipoma of left lower extremity - Plan: [place] LT LOWER EXTREM LTD SOFT TISSUE NON VASCULAR ? ? ?Physical exam today.  Encouraged healthy diet and exercise routine ?Will plan further follow- up pending labs. ?Update Prevnar ?Order ultrasound for likely lipoma on leg.  Patient has some discomfort from this mass, potentially would like to see general surgery for removal ?Continue Crestor, Jardiance, as needed Imitrex.   She plans to taper off atenolol which is fine.  We may or may not refill glipizide depending on her A1c ?Will plan further follow- up pending labs. ? ? ?Signed ?Lamar Blinks, MD ? ?Received labs as belo

## 2022-03-11 ENCOUNTER — Encounter: Payer: Self-pay | Admitting: Family Medicine

## 2022-03-11 ENCOUNTER — Ambulatory Visit (INDEPENDENT_AMBULATORY_CARE_PROVIDER_SITE_OTHER): Payer: Medicare Other | Admitting: Family Medicine

## 2022-03-11 VITALS — BP 110/60 | HR 73 | Temp 98.4°F | Resp 18 | Ht 65.0 in | Wt 165.6 lb

## 2022-03-11 DIAGNOSIS — Z23 Encounter for immunization: Secondary | ICD-10-CM

## 2022-03-11 DIAGNOSIS — E119 Type 2 diabetes mellitus without complications: Secondary | ICD-10-CM

## 2022-03-11 DIAGNOSIS — Z0000 Encounter for general adult medical examination without abnormal findings: Secondary | ICD-10-CM

## 2022-03-11 DIAGNOSIS — Z130 Encounter for screening for diseases of the blood and blood-forming organs and certain disorders involving the immune mechanism: Secondary | ICD-10-CM

## 2022-03-11 DIAGNOSIS — E785 Hyperlipidemia, unspecified: Secondary | ICD-10-CM

## 2022-03-11 DIAGNOSIS — D1724 Benign lipomatous neoplasm of skin and subcutaneous tissue of left leg: Secondary | ICD-10-CM

## 2022-03-11 DIAGNOSIS — Z1329 Encounter for screening for other suspected endocrine disorder: Secondary | ICD-10-CM | POA: Diagnosis not present

## 2022-03-11 DIAGNOSIS — G43009 Migraine without aura, not intractable, without status migrainosus: Secondary | ICD-10-CM

## 2022-03-11 LAB — COMPREHENSIVE METABOLIC PANEL
ALT: 18 U/L (ref 0–35)
AST: 21 U/L (ref 0–37)
Albumin: 4.6 g/dL (ref 3.5–5.2)
Alkaline Phosphatase: 67 U/L (ref 39–117)
BUN: 13 mg/dL (ref 6–23)
CO2: 27 mEq/L (ref 19–32)
Calcium: 9.4 mg/dL (ref 8.4–10.5)
Chloride: 104 mEq/L (ref 96–112)
Creatinine, Ser: 0.59 mg/dL (ref 0.40–1.20)
GFR: 93.67 mL/min (ref >60.00)
Glucose, Bld: 128 mg/dL — ABNORMAL HIGH (ref 70–99)
Potassium: 4.2 mEq/L (ref 3.5–5.1)
Sodium: 139 mEq/L (ref 135–145)
Total Bilirubin: 0.8 mg/dL (ref 0.2–1.2)
Total Protein: 7.1 g/dL (ref 6.0–8.3)

## 2022-03-11 LAB — CBC
HCT: 44.2 % (ref 36.0–46.0)
Hemoglobin: 15.2 g/dL — ABNORMAL HIGH (ref 12.0–15.0)
MCHC: 34.5 g/dL (ref 30.0–36.0)
MCV: 92.5 fl (ref 78.0–100.0)
Platelets: 246 10*3/uL (ref 150.0–400.0)
RBC: 4.78 Mil/uL (ref 3.87–5.11)
RDW: 12.7 % (ref 11.5–15.5)
WBC: 5.4 10*3/uL (ref 4.0–10.5)

## 2022-03-11 LAB — LIPID PANEL
Cholesterol: 194 mg/dL (ref 0–200)
HDL: 81.2 mg/dL (ref >39.00)
LDL Cholesterol: 97 mg/dL (ref 0–99)
NonHDL: 112.4
Total CHOL/HDL Ratio: 2
Triglycerides: 79 mg/dL (ref 0.0–149.0)
VLDL: 15.8 mg/dL (ref 0.0–40.0)

## 2022-03-11 LAB — TSH: TSH: 2.39 u[IU]/mL (ref 0.35–5.50)

## 2022-03-11 LAB — HEMOGLOBIN A1C: Hgb A1c MFr Bld: 6.2 % (ref 4.6–6.5)

## 2022-03-11 MED ORDER — SUMATRIPTAN SUCCINATE 100 MG PO TABS: ORAL_TABLET | ORAL | 6 refills | Status: DC

## 2022-03-11 MED ORDER — ROSUVASTATIN CALCIUM 10 MG PO TABS: 10.0000 mg | ORAL_TABLET | Freq: Every day | ORAL | 3 refills | Status: DC

## 2022-03-11 MED ORDER — EMPAGLIFLOZIN 25 MG PO TABS: 25.0000 mg | ORAL_TABLET | Freq: Every day | ORAL | 3 refills | Status: DC

## 2022-03-11 NOTE — Addendum Note (Signed)
Addended by: Lamar Blinks C on: 03/11/2022 07:05 PM ? ? Modules accepted: Orders ? ?

## 2022-03-16 ENCOUNTER — Encounter: Payer: Self-pay | Admitting: Family Medicine

## 2022-03-16 NOTE — Telephone Encounter (Signed)
Please see below: possible reaction to injection.  ?

## 2022-03-18 ENCOUNTER — Ambulatory Visit (HOSPITAL_BASED_OUTPATIENT_CLINIC_OR_DEPARTMENT_OTHER)
Admission: RE | Admit: 2022-03-18 | Discharge: 2022-03-18 | Disposition: A | Discharge status: Home / Self Care | Payer: Medicare Other | Source: Ambulatory Visit | Attending: Family Medicine | Admitting: Family Medicine

## 2022-03-18 DIAGNOSIS — D1724 Benign lipomatous neoplasm of skin and subcutaneous tissue of left leg: Secondary | ICD-10-CM | POA: Insufficient documentation

## 2022-03-19 ENCOUNTER — Encounter: Payer: Self-pay | Admitting: Family Medicine

## 2022-03-19 DIAGNOSIS — Z1159 Encounter for screening for other viral diseases: Secondary | ICD-10-CM

## 2022-03-19 DIAGNOSIS — D1724 Benign lipomatous neoplasm of skin and subcutaneous tissue of left leg: Secondary | ICD-10-CM

## 2022-03-20 ENCOUNTER — Other Ambulatory Visit (INDEPENDENT_AMBULATORY_CARE_PROVIDER_SITE_OTHER): Payer: Medicare Other

## 2022-03-20 DIAGNOSIS — Z1159 Encounter for screening for other viral diseases: Secondary | ICD-10-CM

## 2022-03-20 LAB — HM DIABETES EYE EXAM

## 2022-03-23 ENCOUNTER — Encounter: Payer: Self-pay | Admitting: Family Medicine

## 2022-03-23 LAB — HEPATITIS PANEL, ACUTE
Hep A IgM: NONREACTIVE
Hep B C IgM: NONREACTIVE
Hepatitis B Surface Ag: NONREACTIVE
Hepatitis C Ab: NONREACTIVE
SIGNAL TO CUT-OFF: <0.02 (ref <1.00)

## 2022-06-10 ENCOUNTER — Encounter: Payer: Self-pay | Admitting: Family Medicine

## 2022-06-10 DIAGNOSIS — E119 Type 2 diabetes mellitus without complications: Secondary | ICD-10-CM

## 2022-06-10 NOTE — Addendum Note (Signed)
Addended by: Lamar Blinks C on: 06/10/2022 01:28 PM   Modules accepted: Orders

## 2022-06-12 ENCOUNTER — Other Ambulatory Visit: Payer: Self-pay | Admitting: Family Medicine

## 2022-06-12 ENCOUNTER — Other Ambulatory Visit (INDEPENDENT_AMBULATORY_CARE_PROVIDER_SITE_OTHER): Payer: Medicare Other

## 2022-06-12 ENCOUNTER — Encounter: Payer: Self-pay | Admitting: Family Medicine

## 2022-06-12 DIAGNOSIS — E119 Type 2 diabetes mellitus without complications: Secondary | ICD-10-CM | POA: Diagnosis not present

## 2022-06-12 LAB — HEMOGLOBIN A1C: Hgb A1c MFr Bld: 7.1 % — ABNORMAL HIGH (ref 4.6–6.5)

## 2022-06-15 MED ORDER — GLIPIZIDE ER 2.5 MG PO TB24: 2.5000 mg | ORAL_TABLET | Freq: Every day | ORAL | 3 refills | Status: DC

## 2022-07-08 ENCOUNTER — Other Ambulatory Visit: Payer: Self-pay | Admitting: General Surgery

## 2022-07-22 ENCOUNTER — Other Ambulatory Visit (INDEPENDENT_AMBULATORY_CARE_PROVIDER_SITE_OTHER): Payer: Medicare Other

## 2022-07-22 ENCOUNTER — Encounter: Payer: Self-pay | Admitting: Family Medicine

## 2022-07-22 ENCOUNTER — Encounter (INDEPENDENT_AMBULATORY_CARE_PROVIDER_SITE_OTHER): Payer: Medicare Other | Admitting: Family Medicine

## 2022-07-22 DIAGNOSIS — R350 Frequency of micturition: Secondary | ICD-10-CM | POA: Diagnosis not present

## 2022-07-22 LAB — POCT URINALYSIS DIP (MANUAL ENTRY)
Bilirubin, UA: NEGATIVE
Blood, UA: NEGATIVE
Glucose, UA: >=1000 mg/dL — AB
Ketones, POC UA: NEGATIVE mg/dL
Nitrite, UA: NEGATIVE
Protein Ur, POC: NEGATIVE mg/dL
Spec Grav, UA: 1.01 (ref 1.010–1.025)
Urobilinogen, UA: 0.2 E.U./dL
pH, UA: 5 (ref 5.0–8.0)

## 2022-07-22 MED ORDER — NITROFURANTOIN MONOHYD MACRO 100 MG PO CAPS: 100.0000 mg | ORAL_CAPSULE | Freq: Two times a day (BID) | ORAL | 0 refills | Status: DC

## 2022-07-22 NOTE — Telephone Encounter (Signed)
Please see the MyChart message reply(ies) for my assessment and plan.  The patient gave consent for this Medical Advice Message and is aware that it may result in a bill to their insurance company as well as the possibility that this may result in a co-payment or deductible. They are an established patient, but are not seeking medical advice exclusively about a problem treated during an in person or video visit in the last 7 days. I did not recommend an in person or video visit within 7 days of my reply.  I spent a total of7 minutes cumulative time within 7 days through MyChart messaging Alla Sloma, MD  

## 2022-07-25 ENCOUNTER — Encounter: Payer: Self-pay | Admitting: Family Medicine

## 2022-07-25 LAB — URINE CULTURE
MICRO NUMBER:: 13787525
SPECIMEN QUALITY:: ADEQUATE

## 2022-08-20 ENCOUNTER — Encounter: Payer: Self-pay | Admitting: Family Medicine

## 2022-08-20 NOTE — Telephone Encounter (Signed)
Called pt and spoke with her regarding AWV. She agrees to do one but was out riding her bike so she will call the office tomorrow to get scheduled when she has her availability in front of her.

## 2022-08-28 ENCOUNTER — Encounter: Payer: Self-pay | Admitting: Family Medicine

## 2022-08-28 ENCOUNTER — Ambulatory Visit (INDEPENDENT_AMBULATORY_CARE_PROVIDER_SITE_OTHER): Payer: Medicare Other

## 2022-08-28 DIAGNOSIS — Z23 Encounter for immunization: Secondary | ICD-10-CM | POA: Diagnosis not present

## 2022-08-28 NOTE — Progress Notes (Signed)
Pt here for Influenza Shot   Pt tolerated it well left deltoid

## 2022-09-09 ENCOUNTER — Ambulatory Visit: Payer: Medicare Other

## 2022-09-16 ENCOUNTER — Ambulatory Visit (INDEPENDENT_AMBULATORY_CARE_PROVIDER_SITE_OTHER): Payer: Medicare Other | Admitting: *Deleted

## 2022-09-16 DIAGNOSIS — Z0000 Encounter for general adult medical examination without abnormal findings: Secondary | ICD-10-CM

## 2022-09-16 NOTE — Patient Instructions (Addendum)
Laurie Hernandez , Thank you for taking time to come for your Medicare Wellness Visit. I appreciate your ongoing commitment to your health goals. Please review the following plan we discussed and let me know if I can assist you in the future.   These are the goals we discussed:  Goals   None     This is a list of the screening recommended for you and due dates:  Health Maintenance  Topic Date Due   COVID-19 Vaccine (6 - Pfizer risk series) 12/11/2021   Yearly kidney health urinalysis for diabetes  06/18/2022   Hemoglobin A1C  12/13/2022   Yearly kidney function blood test for diabetes  03/12/2023   Complete foot exam   03/12/2023   Eye exam for diabetics  03/21/2023   Mammogram  10/01/2023   Colon Cancer Screening  12/26/2026   Tetanus Vaccine  11/16/2028   Pneumonia Vaccine  Completed   Flu Shot  Completed   DEXA scan (bone density measurement)  Completed   Hepatitis C Screening: USPSTF Recommendation to screen - Ages 25-79 yo.  Completed   Zoster (Shingles) Vaccine  Completed   HPV Vaccine  Aged Out     Next appointment: Follow up in one year for your annual wellness visit.   Preventive Care 72 Years and Older, Female Preventive care refers to lifestyle choices and visits with your health care provider that can promote health and wellness. What does preventive care include? A yearly physical exam. This is also called an annual well check. Dental exams once or twice a year. Routine eye exams. Ask your health care provider how often you should have your eyes checked. Personal lifestyle choices, including: Daily care of your teeth and gums. Regular physical activity. Eating a healthy diet. Avoiding tobacco and drug use. Limiting alcohol use. Practicing safe sex. Taking low-dose aspirin every day. Taking vitamin and mineral supplements as recommended by your health care provider. What happens during an annual well check? The services and screenings done by your health care  provider during your annual well check will depend on your age, overall health, lifestyle risk factors, and family history of disease. Counseling  Your health care provider may ask you questions about your: Alcohol use. Tobacco use. Drug use. Emotional well-being. Home and relationship well-being. Sexual activity. Eating habits. History of falls. Memory and ability to understand (cognition). Work and work Statistician. Reproductive health. Screening  You may have the following tests or measurements: Height, weight, and BMI. Blood pressure. Lipid and cholesterol levels. These may be checked every 5 years, or more frequently if you are over 5 years old. Skin check. Lung cancer screening. You may have this screening every year starting at age 56 if you have a 30-pack-year history of smoking and currently smoke or have quit within the past 15 years. Fecal occult blood test (FOBT) of the stool. You may have this test every year starting at age 35. Flexible sigmoidoscopy or colonoscopy. You may have a sigmoidoscopy every 5 years or a colonoscopy every 10 years starting at age 41. Hepatitis C blood test. Hepatitis B blood test. Sexually transmitted disease (STD) testing. Diabetes screening. This is done by checking your blood sugar (glucose) after you have not eaten for a while (fasting). You may have this done every 1-3 years. Bone density scan. This is done to screen for osteoporosis. You may have this done starting at age 20. Mammogram. This may be done every 1-2 years. Talk to your health care provider about  how often you should have regular mammograms. Talk with your health care provider about your test results, treatment options, and if necessary, the need for more tests. Vaccines  Your health care provider may recommend certain vaccines, such as: Influenza vaccine. This is recommended every year. Tetanus, diphtheria, and acellular pertussis (Tdap, Td) vaccine. You may need a Td  booster every 10 years. Zoster vaccine. You may need this after age 98. Pneumococcal 13-valent conjugate (PCV13) vaccine. One dose is recommended after age 72. Pneumococcal polysaccharide (PPSV23) vaccine. One dose is recommended after age 59. Talk to your health care provider about which screenings and vaccines you need and how often you need them. This information is not intended to replace advice given to you by your health care provider. Make sure you discuss any questions you have with your health care provider. Document Released: 12/20/2015 Document Revised: 08/12/2016 Document Reviewed: 09/24/2015 Elsevier Interactive Patient Education  2017 [place] Prevention in the Home Falls can cause injuries. They can happen to people of all ages. There are many things you can do to make your home safe and to help prevent falls. What can I do on the outside of my home? Regularly fix the edges of walkways and driveways and fix any cracks. Remove anything that might make you trip as you walk through a door, such as a raised step or threshold. Trim any bushes or trees on the path to your home. Use bright outdoor lighting. Clear any walking paths of anything that might make someone trip, such as rocks or tools. Regularly check to see if handrails are loose or broken. Make sure that both sides of any steps have handrails. Any raised decks and porches should have guardrails on the edges. Have any leaves, snow, or ice cleared regularly. Use sand or salt on walking paths during winter. Clean up any spills in your garage right away. This includes oil or grease spills. What can I do in the bathroom? Use night lights. Install grab bars by the toilet and in the tub and shower. Do not use towel bars as grab bars. Use non-skid mats or decals in the tub or shower. If you need to sit down in the shower, use a plastic, non-slip stool. Keep the floor dry. Clean up any water that spills on the floor  as soon as it happens. Remove soap buildup in the tub or shower regularly. Attach bath mats securely with double-sided non-slip rug tape. Do not have throw rugs and other things on the floor that can make you trip. What can I do in the bedroom? Use night lights. Make sure that you have a light by your bed that is easy to reach. Do not use any sheets or blankets that are too big for your bed. They should not hang down onto the floor. Have a firm chair that has side arms. You can use this for support while you get dressed. Do not have throw rugs and other things on the floor that can make you trip. What can I do in the kitchen? Clean up any spills right away. Avoid walking on wet floors. Keep items that you use a lot in easy-to-reach places. If you need to reach something above you, use a strong step stool that has a grab bar. Keep electrical cords out of the way. Do not use floor polish or wax that makes floors slippery. If you must use wax, use non-skid floor wax. Do not have throw rugs and other  things on the floor that can make you trip. What can I do with my stairs? Do not leave any items on the stairs. Make sure that there are handrails on both sides of the stairs and use them. Fix handrails that are broken or loose. Make sure that handrails are as long as the stairways. Check any carpeting to make sure that it is firmly attached to the stairs. Fix any carpet that is loose or worn. Avoid having throw rugs at the top or bottom of the stairs. If you do have throw rugs, attach them to the floor with carpet tape. Make sure that you have a light switch at the top of the stairs and the bottom of the stairs. If you do not have them, ask someone to add them for you. What else can I do to help prevent falls? Wear shoes that: Do not have high heels. Have rubber bottoms. Are comfortable and fit you well. Are closed at the toe. Do not wear sandals. If you use a stepladder: Make sure that it is  fully opened. Do not climb a closed stepladder. Make sure that both sides of the stepladder are locked into place. Ask someone to hold it for you, if possible. Clearly mark and make sure that you can see: Any grab bars or handrails. First and last steps. Where the edge of each step is. Use tools that help you move around (mobility aids) if they are needed. These include: Canes. Walkers. Scooters. Crutches. Turn on the lights when you go into a dark area. Replace any light bulbs as soon as they burn out. Set up your furniture so you have a clear path. Avoid moving your furniture around. If any of your floors are uneven, fix them. If there are any pets around you, be aware of where they are. Review your medicines with your doctor. Some medicines can make you feel dizzy. This can increase your chance of falling. Ask your doctor what other things that you can do to help prevent falls. This information is not intended to replace advice given to you by your health care provider. Make sure you discuss any questions you have with your health care provider. Document Released: 09/19/2009 Document Revised: 04/30/2016 Document Reviewed: 12/28/2014 Elsevier Interactive Patient Education  2017 [employer].

## 2022-09-16 NOTE — Progress Notes (Signed)
Subjective:   Laurie Hernandez is a 66 y.o. female who presents for Medicare Annual (Subsequent) preventive examination.  I connected with  LLEWELLYN SCHOENBERGER on 09/16/22 by a audio enabled telemedicine application and verified that I am speaking with the correct person using two identifiers.  Patient Location: Home  Provider Location: Office/Clinic  I discussed the limitations of evaluation and management by telemedicine. The patient expressed understanding and agreed to proceed.   Review of Systems    Defer to PCP Cardiac Risk Factors include: advanced age (>48mn, >>73women);diabetes mellitus     Objective:    There were no vitals filed for this visit. There is no height or weight on file to calculate BMI.     09/16/2022    8:29 AM 12/26/2021    7:29 AM 09/13/2012   12:45 PM 09/08/2012    2:10 PM 08/16/2012    7:56 AM  Advanced Directives  Does Patient Have a Medical Advance Directive? Yes Yes Patient does not have advance directive Patient does not have advance directive Patient does not have advance directive  Type of Advance Directive [place] will     Does patient want to make changes to medical advance directive?  No - Patient declined     Copy of HC[place]in Chart? No - copy requested No - copy requested     Pre-existing out of facility DNR order (yellow form or pink MOST form)   No No No    Current Medications (verified) Outpatient Encounter Medications as of 09/16/2022  Medication Sig   Biotin 10000 MCG TABS Take 10,000 mcg by mouth daily.   Cholecalciferol (VITAMIN D3) 2000 units TABS Take 2,000 Units by mouth.   cycloSPORINE (RESTASIS) 0.05 % ophthalmic emulsion Place 1 drop into both eyes 2 (two) times daily.   empagliflozin (JARDIANCE) 25 MG TABS tablet Take 1 tablet (25 mg total) by mouth daily before breakfast.   glipiZIDE (GLIPIZIDE XL) 2.5 MG 24 hr tablet Take 1  tablet (2.5 mg total) by mouth daily with breakfast.   Misc Natural Products (GLUCOSAMINE CHOND CMP ADVANCED PO) Take 1 Dose by mouth daily at 6 (six) AM.   Multiple Vitamins-Minerals (CENTRUM SILVER 50+WOMEN) TABS Take 1 tablet by mouth daily.   rosuvastatin (CRESTOR) 10 MG tablet Take 1 tablet (10 mg total) by mouth daily.   SUMAtriptan (IMITREX) 100 MG tablet Take 1 tab by mouth daily as needed for migraine.  Max 200 mg in 24 hours   [DISCONTINUED] atenolol (TENORMIN) 25 MG tablet Take 1 tablet (25 mg total) by mouth daily. Due for appt 12/22   [DISCONTINUED] nitrofurantoin, macrocrystal-monohydrate, (MACROBID) 100 MG capsule Take 1 capsule (100 mg total) by mouth 2 (two) times daily.   No facility-administered encounter medications on file as of 09/16/2022.    Allergies (verified) Penicillins and Metformin and related   History: Past Medical History:  Diagnosis Date   Cataract    not a surgical candidate at this time (12/12/2021)   Colon polyps    Depression 2016   hx of-situational depression   Diabetes mellitus    on meds   DVT (deep venous thrombosis) ([facility] 1979   post Left Leg Fx   Headache(784.0)    migraines   Hepatitis    dx in 8th grade-not sure what type, can not donate blood.   History of chicken pox    childhood   History of kidney stones  no surgery required   Hyperlipidemia    on meds   Increased pressure in the eye    Rectal bleeding    SVD (spontaneous vaginal delivery)    x 1   UTI (lower urinary tract infection)    Past Surgical History:  Procedure Laterality Date   ABDOMINAL HYSTERECTOMY  2000   ANTERIOR AND POSTERIOR REPAIR  09/13/2012   Procedure: ANTERIOR (CYSTOCELE) AND POSTERIOR REPAIR (RECTOCELE);  Surgeon: Gus Height, MD;  Location: [facility];  Service: Gynecology;  Laterality: N/A;   BREAST EXCISIONAL BIOPSY     BREAST SURGERY  1994   Left lumpectomy x 3 -  benigh   COLONOSCOPY  08/2012   @ [place] [facility]-good prep-TA   KNEE  CARTILAGE SURGERY Left 2018   KNEE CARTILAGE SURGERY Left 2021   repair of meniscus repeat tear   TONSILLECTOMY     WISDOM TOOTH EXTRACTION     Family History  Problem Relation Age of Onset   Hyperlipidemia Mother        Living   Glaucoma Mother    Sarcoidosis Mother    Colon polyps Father 16   Arthritis/Rheumatoid Father 36       Deceased   Colon cancer Father 14   Heart disease Father    Hypertension Father    Diabetes Father    Thrombocytopenia Brother        #1   Leukemia Brother        #1   Hyperlipidemia Brother        #2   Sleep apnea Brother        #2   Sudden death Maternal Uncle    Heart attack Maternal Uncle    Ovarian cancer Maternal Grandmother    Breast cancer Maternal Grandmother    Depression Son    Social History   Socioeconomic History   Marital status: Married    Spouse name: Not on file   Number of children: Not on file   Years of education: Not on file   Highest education level: Not on file  Occupational History   Not on file  Tobacco Use   Smoking status: Former    Types: Cigarettes   Smokeless tobacco: Never   Tobacco comments:    Only when teenager for 1.5 years  Vaping Use   Vaping Use: Never used  Substance and Sexual Activity   Alcohol use: Not Currently    Alcohol/week: 0.0 - 3.0 standard drinks of alcohol    Comment: socially   Drug use: No   Sexual activity: Yes    Birth control/protection: None    Comment: hysterectomy  Other Topics Concern   Not on file  Social History Narrative   Not on file   Social Determinants of Health   Financial Resource Strain: Low Risk  (09/16/2022)   Overall Financial Resource Strain (CARDIA)    Difficulty of Paying Living Expenses: Not hard at all  Food Insecurity: No Food Insecurity (09/16/2022)   Hunger Vital Sign    Worried About Running Out of Food in the Last Year: Never true    Ran Out of Food in the Last Year: Never true  Transportation Needs: No Transportation Needs  (09/16/2022)   PRAPARE - Hydrologist (Medical): No    Lack of Transportation (Non-Medical): No  Physical Activity: Sufficiently Active (09/16/2022)   Exercise Vital Sign    Days of Exercise per Week: 6 days    Minutes of Exercise  per Session: 150+ min  Stress: No Stress Concern Present (09/16/2022)   [place]    Feeling of Stress : Only a little  Social Connections: Socially Integrated (09/16/2022)   Social Connection and Isolation Panel [NHANES]    Frequency of Communication with Friends and Family: More than three times a week    Frequency of Social Gatherings with Friends and Family: Once a week    Attends Religious Services: 1 to 4 times per year    Active Member of [employer] or Organizations: Yes    Attends Archivist Meetings: Never    Marital Status: Married    Tobacco Counseling Counseling given: Not Answered Tobacco comments: Only when teenager for 1.5 years   Clinical Intake:  Pre-visit preparation completed: Yes  Pain : No/denies pain  How often do you need to have someone help you when you read instructions, pamphlets, or other written materials from your doctor or pharmacy?: 1 - Never  Diabetic? Yes Nutrition Risk Assessment:  Has the patient had any N/V/D within the last 2 months?  No  Does the patient have any non-healing wounds?  No  Has the patient had any unintentional weight loss or weight gain?  No   Diabetes:  Is the patient diabetic?  Yes  If diabetic, was a CBG obtained today?  No  Did the patient bring in their glucometer from home?   Audio visit How often do you monitor your CBG's? Once a week.   Financial Strains and Diabetes Management:  Are you having any financial strains with the device, your supplies or your medication? No .  Does the patient want to be seen by Chronic Care Management for management of their diabetes?  No  Would the  patient like to be referred to a Nutritionist or for Diabetic Management?  No   Diabetic Exams:  Diabetic Eye Exam: Completed 03/20/22 Diabetic Foot Exam: Completed 03/11/22    Interpreter Needed?: No  Information entered by :: Beatris Ship, [place]   Activities of Daily Living    09/16/2022    8:31 AM  In your present state of health, do you have any difficulty performing the following activities:  Hearing? 0  Vision? 0  Difficulty concentrating or making decisions? 0  Walking or climbing stairs? 0  Dressing or bathing? 0  Doing errands, shopping? 0  Preparing Food and eating ? N  Using the Toilet? N  In the past six months, have you accidently leaked urine? Y  Do you have problems with loss of bowel control? N  Managing your Medications? N  Managing your Finances? N  Housekeeping or managing your Housekeeping? N    Patient Care Team: Copland, Gay Filler, MD as PCP - General (Family Medicine)  Indicate any recent Medical Services you may have received from other than [facility] providers in the past year (date may be approximate).     Assessment:   This is a routine wellness examination for [facility].  Hearing/Vision screen No results found.  Dietary issues and exercise activities discussed: Current Exercise Habits: Home exercise routine, Type of exercise: Other - see comments (does 30 miles every day on a bike), Time (Minutes): > 60, Frequency (Times/Week): 6, Weekly Exercise (Minutes/Week): 0, Intensity: Moderate, Exercise limited by: None identified   Goals Addressed   None    Depression Screen    09/16/2022    8:30 AM 03/11/2022    9:21 AM 06/18/2021    9:16  AM  PHQ 2/9 Scores  PHQ - 2 Score 0 0 0    Fall Risk    09/16/2022    8:29 AM 03/11/2022    9:21 AM 06/18/2021    9:16 AM  Fall Risk   Falls in the past year? 0 0 0  Number falls in past yr: 0 0 0  Injury with Fall? 0 0 0  Risk for fall due to : No Fall Risks  No Fall Risks  Follow up Falls evaluation  completed  Falls evaluation completed    [place]:  Any stairs in or around the home? Yes  If so, are there any without handrails? No  Home free of loose throw rugs in walkways, pet beds, electrical cords, etc? Yes  Adequate lighting in your home to reduce risk of falls? Yes   ASSISTIVE DEVICES UTILIZED TO PREVENT FALLS:  Life alert? No  Use of a cane, walker or w/c? No  Grab bars in the bathroom? Yes  Shower chair or bench in shower? Yes  Elevated toilet seat or a handicapped toilet? Yes   TIMED UP AND GO:  Was the test performed?  Audio visit .    Cognitive Function:        09/16/2022    8:38 AM  6CIT Screen  What Year? 0 points  What month? 0 points  What time? 0 points  Count back from 20 0 points  Months in reverse 0 points  Repeat phrase 0 points  Total Score 0 points    Immunizations Immunization History  Administered Date(s) Administered   Fluad Quad(high Dose 65+) 08/28/2022   Influenza,inj,Quad PF,6+ Mos 08/02/2019   Influenza-Unspecified 10/08/2015, 11/01/2018, 09/09/2020, 08/21/2021   PFIZER(Purple Top)SARS-COV-2 Vaccination 01/27/2020, 02/20/2020, 09/09/2020, 05/02/2021   PNEUMOCOCCAL CONJUGATE-20 03/11/2022   Pfizer Covid-19 Vaccine Bivalent Booster 54yr & up 10/16/2021   Pneumococcal Polysaccharide-23 11/16/2018   Tdap 11/16/2018   Zoster Recombinat (Shingrix) 08/02/2019, 10/18/2019    TDAP status: Up to date  Flu Vaccine status: Up to date  Pneumococcal vaccine status: Up to date  Covid-19 vaccine status: Information provided on how to obtain vaccines.   Qualifies for Shingles Vaccine? Yes   Zostavax completed No   Shingrix Completed?: Yes  Screening Tests Health Maintenance  Topic Date Due   COVID-19 Vaccine (6 - Pfizer risk series) 12/11/2021   Diabetic kidney evaluation - Urine ACR  06/18/2022   HEMOGLOBIN A1C  12/13/2022   Diabetic kidney evaluation - GFR measurement  03/12/2023   FOOT EXAM   03/12/2023   OPHTHALMOLOGY EXAM  03/21/2023   MAMMOGRAM  10/01/2023   COLONOSCOPY (Pts 45-415yrInsurance coverage will need to be confirmed)  12/26/2026   TETANUS/TDAP  11/16/2028   Pneumonia Vaccine 6565Years old  Completed   INFLUENZA VACCINE  Completed   DEXA SCAN  Completed   Hepatitis C Screening  Completed   Zoster Vaccines- Shingrix  Completed   HPV VACCINES  Aged Out    Health Maintenance  Health Maintenance Due  Topic Date Due   COVID-19 Vaccine (6 - Pfizer risk series) 12/11/2021   Diabetic kidney evaluation - Urine ACR  06/18/2022    Colorectal cancer screening: Type of screening: Colonoscopy. Completed 12/26/21. Repeat every 5 years  Mammogram status: Completed 09/30/21. Repeat every year  Bone Density status: Completed 10/14/21. Results reflect: Bone density results: OSTEOPENIA. Repeat every 2 years.  Lung Cancer Screening: (Low Dose CT Chest recommended if Age 67-80ears, 30 pack-year  currently smoking OR have quit w/in 15years.) does not qualify.   Lung Cancer Screening Referral: N/a  Additional Screening:  Hepatitis C Screening: does qualify; Completed 03/20/22  Vision Screening: Recommended annual ophthalmology exams for early detection of glaucoma and other disorders of the eye. Is the patient up to date with their annual eye exam?  Yes  Who is the provider or what is the name of the office in which the patient attends annual eye exams? [facility] If pt is not established with a provider, would they like to be referred to a provider to establish care? No .   Dental Screening: Recommended annual dental exams for proper oral hygiene  Community Resource Referral / Chronic Care Management: CRR required this visit?  No   CCM required this visit?  No      Plan:     I have personally reviewed and noted the following in the patient's chart:   Medical and social history Use of alcohol, tobacco or illicit drugs  Current medications and supplements  including opioid prescriptions. Patient is not currently taking opioid prescriptions. Functional ability and status Nutritional status Physical activity Advanced directives List of other physicians Hospitalizations, surgeries, and ER visits in previous 12 months Vitals Screenings to include cognitive, depression, and falls Referrals and appointments  In addition, I have reviewed and discussed with patient certain preventive protocols, quality metrics, and best practice recommendations. A written personalized care plan for preventive services as well as general preventive health recommendations were provided to patient.   Due to this being a telephonic visit, the after visit summary with patients personalized plan was offered to patient via mail or my-chart. Patient would like to access on my-chart.  Beatris Ship, [place]   09/16/2022   Nurse Notes: None

## 2022-11-11 ENCOUNTER — Encounter: Payer: Self-pay | Admitting: Family Medicine

## 2022-11-20 MED ORDER — BENZONATATE 200 MG PO CAPS: 200.0000 mg | ORAL_CAPSULE | Freq: Three times a day (TID) | ORAL | 1 refills | Status: DC | PRN

## 2022-12-31 ENCOUNTER — Encounter: Payer: Self-pay | Admitting: Family Medicine

## 2023-01-01 MED ORDER — PREDNISONE 20 MG PO TABS: ORAL_TABLET | ORAL | 0 refills | Status: DC

## 2023-01-01 NOTE — Patient Instructions (Incomplete)
It was good to see you today, we will work on getting you an ENT appointment. I will be in touch with your labs

## 2023-01-01 NOTE — Addendum Note (Signed)
Addended by: Lamar Blinks C on: 01/01/2023 05:22 AM   Modules accepted: Orders

## 2023-01-01 NOTE — Progress Notes (Unsigned)
[place] [facility] [address] [place], [place] [address] [phone]  Date:  01/04/2023   Name:  Laurie Hernandez   DOB:  1956-11-26   MRN:  [id number]  PCP:  Darreld Mclean, MD    Chief Complaint: Tinnitus (Onset with covid diagnosis /Prednisone did help but ringing is still there Manual Meier at night )   History of Present Illness:  Laurie Hernandez is a 67 y.o. 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  Enjoys cycling outdoors for exercise  Currently using Jardiance for diabetes control  She contacted [place] 12/6 with a positive test for COVID-19 She then contacted me on 1/25 with concern of constant bilateral tinnitus, new since she had COVID The tinnitus is constant, nonpulsatile.  Seems to be in both ears- sx about equal  She feels that her hearing is decreased No pain in her ears   She has been on the steroid now for 3 doses She notes the ringing mostly when she is trying to sleep in the quiet  She is using the pred 40; her glucose is running under 200   We decided to start her on some prednisone in case this is sensorineural hearing loss  She would also potentially be a good candidate for CT coronary calcium Lab Results  Component Value Date   HGBA1C 7.1 (H) 06/12/2022    Patient Active Problem List   Diagnosis Date Noted   Osteopenia 10/14/2021   History of vitamin D deficiency 06/27/2016   History of kidney stones 11/30/2015   Depression 01/20/2015   Migraine without aura and without status migrainosus, not intractable 01/20/2015   Type 2 diabetes mellitus without complication ([place]) 54/00/8676    Past Medical History:  Diagnosis Date   Cataract     not a surgical candidate at this time (12/12/2021)   Colon polyps    Depression 2016   hx of-situational depression   Diabetes mellitus    on meds   DVT (deep venous thrombosis) ([place]) 1979   post Left Leg Fx   Headache(784.0)    migraines   Hepatitis    dx in 8th grade-not sure what type, can not donate blood.   History of chicken pox    childhood   History of kidney stones    no surgery required   Hyperlipidemia    on meds   Increased pressure in the eye    Rectal bleeding    SVD (spontaneous vaginal delivery)    x 1   UTI (lower urinary tract infection)     Past Surgical History:  Procedure Laterality Date   ABDOMINAL HYSTERECTOMY  2000   ANTERIOR AND POSTERIOR REPAIR  09/13/2012   Procedure: ANTERIOR (CYSTOCELE) AND POSTERIOR REPAIR (RECTOCELE);  Surgeon: Gus Height, MD;  Location: [facility];  Service: Gynecology;  Laterality: N/A;   BREAST EXCISIONAL BIOPSY     BREAST SURGERY  1994   Left lumpectomy x 3 -  benigh   COLONOSCOPY  08/2012   @ [facility]-with Dr.Outlaw-MAC-good prep-TA   KNEE CARTILAGE SURGERY Left 2018   KNEE CARTILAGE SURGERY Left 2021   repair of meniscus repeat tear   TONSILLECTOMY     WISDOM TOOTH  EXTRACTION      Social History   Tobacco Use   Smoking status: Former    Types: Cigarettes   Smokeless tobacco: Never   Tobacco comments:    Only when teenager for 1.5 years  Vaping Use   Vaping Use: Never used  Substance Use Topics   Alcohol use: Not Currently    Alcohol/week: 0.0 - 3.0 standard drinks of alcohol    Comment: socially   Drug use: No    Family History  Problem Relation Age of Onset   Hyperlipidemia Mother        Living   Glaucoma Mother    Sarcoidosis Mother    Colon polyps Father 62   Arthritis/Rheumatoid Father 34       Deceased   Colon cancer Father 42   Heart disease Father    Hypertension Father    Diabetes Father    Thrombocytopenia Brother        #1   Leukemia Brother        #1   Hyperlipidemia Brother         #2   Sleep apnea Brother        #2   Sudden death Maternal Uncle    Heart attack Maternal Uncle    Ovarian cancer Maternal Grandmother    Breast cancer Maternal Grandmother    Depression Son     Allergies  Allergen Reactions   Penicillins Rash    As a child - can't remember   Metformin And Related Nausea And Vomiting    Medication list has been reviewed and updated.  Current Outpatient Medications on File Prior to Visit  Medication Sig Dispense Refill   B Complex-C (B-COMPLEX WITH VITAMIN C) tablet Take 1 tablet by mouth daily.     benzonatate (TESSALON) 200 MG capsule Take 1 capsule (200 mg total) by mouth 3 (three) times daily as needed for cough. 60 capsule 1   Biotin 10000 MCG TABS Take 10,000 mcg by mouth daily.     Cholecalciferol (VITAMIN D3) 2000 units TABS Take 2,000 Units by mouth.     cycloSPORINE (RESTASIS) 0.05 % ophthalmic emulsion Place 1 drop into both eyes 2 (two) times daily.     empagliflozin (JARDIANCE) 25 MG TABS tablet Take 1 tablet (25 mg total) by mouth daily before breakfast. 90 tablet 3   glipiZIDE (GLIPIZIDE XL) 2.5 MG 24 hr tablet Take 1 tablet (2.5 mg total) by mouth daily with breakfast. 90 tablet 3   MAGNESIUM GLYCINATE PO Take by mouth.     Misc Natural Products (GLUCOSAMINE CHOND CMP ADVANCED PO) Take 1 Dose by mouth daily at 6 (six) AM.     Multiple Vitamins-Minerals (CENTRUM SILVER 50+WOMEN) TABS Take 1 tablet by mouth daily.     predniSONE (DELTASONE) 20 MG tablet Take 20 to 40 mg once daily 20 tablet 0   rosuvastatin (CRESTOR) 10 MG tablet Take 1 tablet (10 mg total) by mouth daily. 90 tablet 3   SUMAtriptan (IMITREX) 100 MG tablet Take 1 tab by mouth daily as needed for migraine.  Max 200 mg in 24 hours 9 tablet 6   No current facility-administered medications on file prior to visit.    Review of Systems:  As per HPI- otherwise negative.   Physical Examination: Vitals:   01/04/23 0947  BP: 122/72  Pulse: 81  SpO2: 98%   Vitals:    01/04/23 0947  Height: 5' 5"  (1.651 m)   Body mass index is 27.56 kg/m.  Ideal Body Weight: Weight in (lb) to have BMI = 25: 149.9  GEN: no acute distress.  Mild overweight, looks well  HEENT: Atraumatic, Normocephalic.  Bilateral TM wnl, oropharynx normal.  PEERL,EOMI.   Ears and Nose: No external deformity. CV: RRR, No M/G/R. No JVD. No thrill. No extra heart sounds. PULM: CTA B, no wheezes, crackles, rhonchi. No retractions. No resp. distress. No accessory muscle use. ABD: S, NT, ND, +BS. No rebound. No HSM. EXTR: No c/c/e PSYCH: Normally interactive. Conversant.    Assessment and Plan: Controlled type 2 diabetes mellitus without complication, without long-term current use of insulin ([place]) - Plan: Hemoglobin A1c, Microalbumin / creatinine urine ratio, Comprehensive metabolic panel  Screening for deficiency anemia - Plan: CBC  Dyslipidemia - Plan: Lipid panel  Bilateral hearing loss, unspecified hearing loss type - Plan: Ambulatory referral to ENT  Tinnitus of both ears - Plan: Ambulatory referral to ENT  Patient seen today for follow-up of diabetes, anemia, dyslipidemia.  Also she has noted tinnitus in her bilateral ears and possible decrease in hearing since the week after Christmas.  She has been taking steroids with possibly some improvement She has constant, nonpulsatile tinnitus Plan to have her see ENT if possible this week to evaluate for potential sensorineural hearing loss Will plan further follow- up pending labs. Patient contacted me, she did get an ENT appointment for later this week Signed Lamar Blinks, MD  Received labs as below, message to patient  Results for orders placed or performed in visit on 01/04/23  Hemoglobin A1c  Result Value Ref Range   Hgb A1c MFr Bld 6.9 (H) 4.6 - 6.5 %  Microalbumin / creatinine urine ratio  Result Value Ref Range   Microalb, Ur <0.7 0.0 - 1.9 mg/dL   Creatinine,U 18.8 mg/dL   Microalb Creat Ratio 3.7 0.0 - 30.0 mg/g   CBC  Result Value Ref Range   WBC 8.6 4.0 - 10.5 K/uL   RBC 4.52 3.87 - 5.11 Mil/uL   Platelets 258.0 150.0 - 400.0 K/uL   Hemoglobin 14.6 12.0 - 15.0 g/dL   HCT 42.0 36.0 - 46.0 %   MCV 92.8 78.0 - 100.0 fl   MCHC 34.8 30.0 - 36.0 g/dL   RDW 12.9 11.5 - 15.5 %  Comprehensive metabolic panel  Result Value Ref Range   Sodium 143 135 - 145 mEq/L   Potassium 3.7 3.5 - 5.1 mEq/L   Chloride 104 96 - 112 mEq/L   CO2 30 19 - 32 mEq/L   Glucose, Bld 196 (H) 70 - 99 mg/dL   BUN 15 6 - 23 mg/dL   Creatinine, Ser 0.61 0.40 - 1.20 mg/dL   Total Bilirubin 0.6 0.2 - 1.2 mg/dL   Alkaline Phosphatase 63 39 - 117 U/L   AST 16 0 - 37 U/L   ALT 17 0 - 35 U/L   Total Protein 6.8 6.0 - 8.3 g/dL   Albumin 4.2 3.5 - 5.2 g/dL   GFR 92.39 >60.00 mL/min   Calcium 9.2 8.4 - 10.5 mg/dL  Lipid panel  Result Value Ref Range   Cholesterol 192 0 - 200 mg/dL   Triglycerides 126.0 0.0 - 149.0 mg/dL   HDL 82.90 >39.00 mg/dL   VLDL 25.2 0.0 - 40.0 mg/dL   LDL Cholesterol 83 0 - 99 mg/dL   Total CHOL/HDL Ratio 2    NonHDL 108.68

## 2023-01-04 ENCOUNTER — Ambulatory Visit (INDEPENDENT_AMBULATORY_CARE_PROVIDER_SITE_OTHER): Payer: Medicare Other | Admitting: Family Medicine

## 2023-01-04 ENCOUNTER — Encounter: Payer: Self-pay | Admitting: Family Medicine

## 2023-01-04 VITALS — BP 122/72 | HR 81 | Ht 65.0 in

## 2023-01-04 DIAGNOSIS — Z130 Encounter for screening for diseases of the blood and blood-forming organs and certain disorders involving the immune mechanism: Secondary | ICD-10-CM

## 2023-01-04 DIAGNOSIS — H9313 Tinnitus, bilateral: Secondary | ICD-10-CM

## 2023-01-04 DIAGNOSIS — E119 Type 2 diabetes mellitus without complications: Secondary | ICD-10-CM

## 2023-01-04 DIAGNOSIS — H9193 Unspecified hearing loss, bilateral: Secondary | ICD-10-CM

## 2023-01-04 DIAGNOSIS — E785 Hyperlipidemia, unspecified: Secondary | ICD-10-CM

## 2023-01-04 LAB — LIPID PANEL
Cholesterol: 192 mg/dL (ref 0–200)
HDL: 82.9 mg/dL (ref >39.00)
LDL Cholesterol: 83 mg/dL (ref 0–99)
NonHDL: 108.68
Total CHOL/HDL Ratio: 2
Triglycerides: 126 mg/dL (ref 0.0–149.0)
VLDL: 25.2 mg/dL (ref 0.0–40.0)

## 2023-01-04 LAB — COMPREHENSIVE METABOLIC PANEL
ALT: 17 U/L (ref 0–35)
AST: 16 U/L (ref 0–37)
Albumin: 4.2 g/dL (ref 3.5–5.2)
Alkaline Phosphatase: 63 U/L (ref 39–117)
BUN: 15 mg/dL (ref 6–23)
CO2: 30 mEq/L (ref 19–32)
Calcium: 9.2 mg/dL (ref 8.4–10.5)
Chloride: 104 mEq/L (ref 96–112)
Creatinine, Ser: 0.61 mg/dL (ref 0.40–1.20)
GFR: 92.39 mL/min (ref >60.00)
Glucose, Bld: 196 mg/dL — ABNORMAL HIGH (ref 70–99)
Potassium: 3.7 mEq/L (ref 3.5–5.1)
Sodium: 143 mEq/L (ref 135–145)
Total Bilirubin: 0.6 mg/dL (ref 0.2–1.2)
Total Protein: 6.8 g/dL (ref 6.0–8.3)

## 2023-01-04 LAB — CBC
HCT: 42 % (ref 36.0–46.0)
Hemoglobin: 14.6 g/dL (ref 12.0–15.0)
MCHC: 34.8 g/dL (ref 30.0–36.0)
MCV: 92.8 fl (ref 78.0–100.0)
Platelets: 258 10*3/uL (ref 150.0–400.0)
RBC: 4.52 Mil/uL (ref 3.87–5.11)
RDW: 12.9 % (ref 11.5–15.5)
WBC: 8.6 10*3/uL (ref 4.0–10.5)

## 2023-01-04 LAB — MICROALBUMIN / CREATININE URINE RATIO
Creatinine,U: 18.8 mg/dL
Microalb Creat Ratio: 3.7 mg/g (ref 0.0–30.0)
Microalb, Ur: <0.7 mg/dL (ref 0.0–1.9)

## 2023-01-04 LAB — HEMOGLOBIN A1C: Hgb A1c MFr Bld: 6.9 % — ABNORMAL HIGH (ref 4.6–6.5)

## 2023-01-07 DIAGNOSIS — H9313 Tinnitus, bilateral: Secondary | ICD-10-CM | POA: Diagnosis not present

## 2023-01-07 DIAGNOSIS — H938X3 Other specified disorders of ear, bilateral: Secondary | ICD-10-CM | POA: Diagnosis not present

## 2023-01-22 DIAGNOSIS — M79644 Pain in right finger(s): Secondary | ICD-10-CM | POA: Diagnosis not present

## 2023-01-22 DIAGNOSIS — M79672 Pain in left foot: Secondary | ICD-10-CM | POA: Diagnosis not present

## 2023-01-25 DIAGNOSIS — M79644 Pain in right finger(s): Secondary | ICD-10-CM | POA: Diagnosis not present

## 2023-01-25 DIAGNOSIS — S62524A Nondisplaced fracture of distal phalanx of right thumb, initial encounter for closed fracture: Secondary | ICD-10-CM | POA: Diagnosis not present

## 2023-01-27 DIAGNOSIS — M79672 Pain in left foot: Secondary | ICD-10-CM | POA: Diagnosis not present

## 2023-01-27 DIAGNOSIS — S92355A Nondisplaced fracture of fifth metatarsal bone, left foot, initial encounter for closed fracture: Secondary | ICD-10-CM | POA: Diagnosis not present

## 2023-02-03 DIAGNOSIS — M25649 Stiffness of unspecified hand, not elsewhere classified: Secondary | ICD-10-CM | POA: Diagnosis not present

## 2023-02-05 ENCOUNTER — Encounter: Payer: Self-pay | Admitting: Family Medicine

## 2023-02-10 DIAGNOSIS — M79644 Pain in right finger(s): Secondary | ICD-10-CM | POA: Diagnosis not present

## 2023-02-17 DIAGNOSIS — M79644 Pain in right finger(s): Secondary | ICD-10-CM | POA: Diagnosis not present

## 2023-02-22 DIAGNOSIS — S92355A Nondisplaced fracture of fifth metatarsal bone, left foot, initial encounter for closed fracture: Secondary | ICD-10-CM | POA: Diagnosis not present

## 2023-03-05 ENCOUNTER — Encounter: Payer: Self-pay | Admitting: Family Medicine

## 2023-03-05 DIAGNOSIS — E119 Type 2 diabetes mellitus without complications: Secondary | ICD-10-CM

## 2023-03-05 DIAGNOSIS — E785 Hyperlipidemia, unspecified: Secondary | ICD-10-CM

## 2023-03-08 MED ORDER — ROSUVASTATIN CALCIUM 10 MG PO TABS
10.0000 mg | ORAL_TABLET | Freq: Every day | ORAL | 3 refills | Status: DC
Start: 2023-03-08 — End: 2023-10-07

## 2023-03-08 MED ORDER — GLIPIZIDE ER 2.5 MG PO TB24: 2.5000 mg | ORAL_TABLET | Freq: Every day | ORAL | 3 refills | Status: DC

## 2023-03-08 MED ORDER — EMPAGLIFLOZIN 25 MG PO TABS: 25.0000 mg | ORAL_TABLET | Freq: Every day | ORAL | 3 refills | Status: DC

## 2023-03-10 ENCOUNTER — Other Ambulatory Visit: Payer: Self-pay | Admitting: Family Medicine

## 2023-03-10 DIAGNOSIS — E785 Hyperlipidemia, unspecified: Secondary | ICD-10-CM

## 2023-03-24 DIAGNOSIS — S92355D Nondisplaced fracture of fifth metatarsal bone, left foot, subsequent encounter for fracture with routine healing: Secondary | ICD-10-CM | POA: Diagnosis not present

## 2023-04-01 DIAGNOSIS — H903 Sensorineural hearing loss, bilateral: Secondary | ICD-10-CM | POA: Diagnosis not present

## 2023-04-01 DIAGNOSIS — H9313 Tinnitus, bilateral: Secondary | ICD-10-CM | POA: Diagnosis not present

## 2023-04-16 DIAGNOSIS — H35373 Puckering of macula, bilateral: Secondary | ICD-10-CM | POA: Diagnosis not present

## 2023-04-16 DIAGNOSIS — E119 Type 2 diabetes mellitus without complications: Secondary | ICD-10-CM | POA: Diagnosis not present

## 2023-04-16 DIAGNOSIS — H2513 Age-related nuclear cataract, bilateral: Secondary | ICD-10-CM | POA: Diagnosis not present

## 2023-04-16 LAB — HM DIABETES EYE EXAM

## 2023-04-23 ENCOUNTER — Encounter: Payer: Self-pay | Admitting: Family Medicine

## 2023-04-23 DIAGNOSIS — G43009 Migraine without aura, not intractable, without status migrainosus: Secondary | ICD-10-CM

## 2023-04-23 MED ORDER — SUMATRIPTAN SUCCINATE 100 MG PO TABS
ORAL_TABLET | ORAL | 6 refills | Status: AC
Start: 2023-04-23 — End: ?

## 2023-07-07 ENCOUNTER — Encounter (INDEPENDENT_AMBULATORY_CARE_PROVIDER_SITE_OTHER): Payer: Self-pay

## 2023-07-18 NOTE — Progress Notes (Unsigned)
[facility] [address], [address] [place], [place] [address] [phone]  Date:  07/21/2023   Name:  Laurie Hernandez   DOB:  1956/01/08   MRN:  [id number]  PCP:  Pearline Cables, MD    Chief Complaint: No chief complaint on file.   History of Present Illness:  Laurie Hernandez is a 67 y.o. 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  Enjoys cycling outdoors for exercise  She notes that [place] would no longer be affordable for her given insurance change.  She is not able to tolerate metformin  Also taking glipizide XL 2.5 daily Crestor 10 Sumatriptan as needed Most recent lab work-in January CMP, lipid, CBC, A1c, urine microalbumin  Lab Results  Component Value Date   HGBA1C 6.9 (H) 01/04/2023   Foot exam due Recommend flu shot, COVID booster this fall Shingrix is complete Mammogram ???  Bone density 11/22 Colonoscopy completed last year Patient Active Problem List   Diagnosis Date Noted   Osteopenia 10/14/2021   History of vitamin D deficiency 06/27/2016   History of kidney stones 11/30/2015   Depression 01/20/2015   Migraine without aura and without status migrainosus, not intractable 01/20/2015   Type 2 diabetes mellitus without complication ([facility]) 01/20/2015    Past Medical History:  Diagnosis Date   Cataract    not a surgical candidate at this time (12/12/2021)   Colon polyps    Depression 2016   hx of-situational depression   Diabetes mellitus    on meds   DVT (deep venous thrombosis) ([facility]) 1979   post Left Leg Fx   Headache(784.0)    migraines   Hepatitis    dx in 8th grade-not sure what type, can not donate blood.   History of chicken pox     childhood   History of kidney stones    no surgery required   Hyperlipidemia    on meds   Increased pressure in the eye    Rectal bleeding    SVD (spontaneous vaginal delivery)    x 1   UTI (lower urinary tract infection)     Past Surgical History:  Procedure Laterality Date   ABDOMINAL HYSTERECTOMY  2000   ANTERIOR AND POSTERIOR REPAIR  09/13/2012   Procedure: ANTERIOR (CYSTOCELE) AND POSTERIOR REPAIR (RECTOCELE);  Surgeon: Miguel Aschoff, MD;  Location: [facility];  Service: Gynecology;  Laterality: N/A;   BREAST EXCISIONAL BIOPSY     BREAST SURGERY  1994   Left lumpectomy x 3 -  ben[facility]   COLONOSCOPY  08/2012   @ [facility]-with Dr.Outlaw-MAC-good prep-TA   KNEE CARTILAGE SURGERY Left 2018   KNEE CARTILAGE SURGERY Left 2021   repair of meniscus repeat tear   TONSILLECTOMY     WISDOM TOOTH EXTRACTION      Social History   Tobacco Use   Smoking status: Former    Types: Cigarettes   Smokeless tobacco: Never   Tobacco comments:    Only when teenager for 1.5 years  Vaping Use   Vaping status: Never Used  Substance Use Topics   Alcohol use: Not Currently    Alcohol/week: 0.0 - 3.0 standard drinks of alcohol    Comment: socially   Drug  use: No    Family History  Problem Relation Age of Onset   Hyperlipidemia Mother        Living   Glaucoma Mother    Sarcoidosis Mother    Colon polyps Father 91   Arthritis/Rheumatoid Father 63       Deceased   Colon cancer Father 72   Heart disease Father    Hypertension Father    Diabetes Father    Thrombocytopenia Brother        #1   Leukemia Brother        #1   Hyperlipidemia Brother        #2   Sleep apnea Brother        #2   Sudden death Maternal Uncle    Heart attack Maternal Uncle    Ovarian cancer Maternal Grandmother    Breast cancer Maternal Grandmother    Depression Son     Allergies  Allergen Reactions   Penicillins Rash    As a child - can't remember   Metformin And Related Nausea And Vomiting    Medication  list has been reviewed and updated.  Current Outpatient Medications on File Prior to Visit  Medication Sig Dispense Refill   B Complex-C (B-COMPLEX WITH VITAMIN C) tablet Take 1 tablet by mouth daily.     benzonatate (TESSALON) 200 MG capsule Take 1 capsule (200 mg total) by mouth 3 (three) times daily as needed for cough. 60 capsule 1   Biotin 16109 MCG TABS Take 10,000 mcg by mouth daily.     Cholecalciferol (VITAMIN D3) 2000 units TABS Take 2,000 Units by mouth.     cycloSPORINE (RESTASIS) 0.05 % ophthalmic emulsion Place 1 drop into both eyes 2 (two) times daily.     empagliflozin (JARDIANCE) 25 MG TABS tablet Take 1 tablet (25 mg total) by mouth daily before breakfast. 90 tablet 3   glipiZIDE (GLIPIZIDE XL) 2.5 MG 24 hr tablet Take 1 tablet (2.5 mg total) by mouth daily with breakfast. 90 tablet 3   MAGNESIUM GLYCINATE PO Take by mouth.     Misc Natural Products (GLUCOSAMINE CHOND CMP ADVANCED PO) Take 1 Dose by mouth daily at 6 (six) AM.     Multiple Vitamins-Minerals (CENTRUM SILVER 50+WOMEN) TABS Take 1 tablet by mouth daily.     predniSONE (DELTASONE) 20 MG tablet Take 20 to 40 mg once daily 20 tablet 0   rosuvastatin (CRESTOR) 10 MG tablet Take 1 tablet (10 mg total) by mouth daily. 90 tablet 3   SUMAtriptan (IMITREX) 100 MG tablet Take 1 tab by mouth daily as needed for migraine.  Max 200 mg in 24 hours 9 tablet 6   No current facility-administered medications on file prior to visit.    Review of Systems:  As per HPI- otherwise negative.   Physical Examination: There were no vitals filed for this visit. There were no vitals filed for this visit. There is no height or weight on file to calculate BMI. Ideal Body Weight:    GEN: no acute distress. HEENT: Atraumatic, Normocephalic.  Ears and Nose: No external deformity. CV: RRR, No M/G/R. No JVD. No thrill. No extra heart sounds. PULM: CTA B, no wheezes, crackles, rhonchi. No retractions. No resp. distress. No accessory  muscle use. ABD: S, NT, ND, +BS. No rebound. No HSM. EXTR: No c/c/e PSYCH: Normally interactive. Conversant.  Foot exam  Assessment and Plan: *** Physical exam today.  Encouraged healthy diet and exercise routine Will plan further follow-  up pending labs.  Signed Abbe Amsterdam, MD

## 2023-07-18 NOTE — Patient Instructions (Signed)
It was great to see you again today, I will be in touch with your lab work Recommend flu shot, COVID booster this fall  Please ask your insurance if they prefer a different drug in the SGLT-2 category  If they do NOT- we can apply to the company for assistance or I can try to get you some samples- let me know  It looks like your screening mammogram is due- this can be done through [facility] or through [facility] if you prefer- just let me know if you need me to place an order for you  Bone density can be updated this coming November

## 2023-07-21 ENCOUNTER — Ambulatory Visit (INDEPENDENT_AMBULATORY_CARE_PROVIDER_SITE_OTHER): Payer: Medicare Other | Admitting: Family Medicine

## 2023-07-21 ENCOUNTER — Encounter: Payer: Self-pay | Admitting: Family Medicine

## 2023-07-21 VITALS — BP 122/80 | HR 75 | Temp 98.0°F | Resp 18 | Ht 65.0 in | Wt 163.8 lb

## 2023-07-21 DIAGNOSIS — Z0000 Encounter for general adult medical examination without abnormal findings: Secondary | ICD-10-CM

## 2023-07-21 DIAGNOSIS — E119 Type 2 diabetes mellitus without complications: Secondary | ICD-10-CM

## 2023-07-21 DIAGNOSIS — G43009 Migraine without aura, not intractable, without status migrainosus: Secondary | ICD-10-CM | POA: Diagnosis not present

## 2023-07-21 DIAGNOSIS — Z7984 Long term (current) use of oral hypoglycemic drugs: Secondary | ICD-10-CM | POA: Diagnosis not present

## 2023-07-21 DIAGNOSIS — E785 Hyperlipidemia, unspecified: Secondary | ICD-10-CM

## 2023-07-21 DIAGNOSIS — M8580 Other specified disorders of bone density and structure, unspecified site: Secondary | ICD-10-CM | POA: Diagnosis not present

## 2023-07-21 LAB — BASIC METABOLIC PANEL
BUN: 14 mg/dL (ref 6–23)
CO2: 29 mEq/L (ref 19–32)
Calcium: 9.3 mg/dL (ref 8.4–10.5)
Chloride: 103 mEq/L (ref 96–112)
Creatinine, Ser: 0.65 mg/dL (ref 0.40–1.20)
GFR: 90.64 mL/min (ref >60.00)
Glucose, Bld: 148 mg/dL — ABNORMAL HIGH (ref 70–99)
Potassium: 4 mEq/L (ref 3.5–5.1)
Sodium: 140 mEq/L (ref 135–145)

## 2023-07-21 LAB — HEMOGLOBIN A1C: Hgb A1c MFr Bld: 6.4 % (ref 4.6–6.5)

## 2023-08-03 ENCOUNTER — Encounter: Payer: Self-pay | Admitting: Family Medicine

## 2023-08-03 DIAGNOSIS — E119 Type 2 diabetes mellitus without complications: Secondary | ICD-10-CM

## 2023-08-04 MED ORDER — EMPAGLIFLOZIN 25 MG PO TABS
25.0000 mg | ORAL_TABLET | Freq: Every day | ORAL | 3 refills | Status: DC
Start: 2023-08-04 — End: 2024-07-18

## 2023-08-04 NOTE — Telephone Encounter (Signed)
I have sent the Rx in.

## 2023-08-18 ENCOUNTER — Encounter (HOSPITAL_BASED_OUTPATIENT_CLINIC_OR_DEPARTMENT_OTHER): Payer: Self-pay

## 2023-08-18 ENCOUNTER — Ambulatory Visit (HOSPITAL_BASED_OUTPATIENT_CLINIC_OR_DEPARTMENT_OTHER)
Admission: RE | Admit: 2023-08-18 | Discharge: 2023-08-18 | Disposition: A | Discharge status: Home / Self Care | Payer: Medicare Other | Source: Ambulatory Visit | Attending: Family Medicine | Admitting: Family Medicine

## 2023-08-18 ENCOUNTER — Other Ambulatory Visit (HOSPITAL_BASED_OUTPATIENT_CLINIC_OR_DEPARTMENT_OTHER): Payer: Self-pay | Admitting: Family Medicine

## 2023-08-18 ENCOUNTER — Other Ambulatory Visit (HOSPITAL_BASED_OUTPATIENT_CLINIC_OR_DEPARTMENT_OTHER): Payer: Medicare Other

## 2023-08-18 DIAGNOSIS — M8580 Other specified disorders of bone density and structure, unspecified site: Secondary | ICD-10-CM

## 2023-08-18 DIAGNOSIS — Z1231 Encounter for screening mammogram for malignant neoplasm of breast: Secondary | ICD-10-CM | POA: Insufficient documentation

## 2023-08-18 DIAGNOSIS — E785 Hyperlipidemia, unspecified: Secondary | ICD-10-CM | POA: Insufficient documentation

## 2023-08-18 DIAGNOSIS — E119 Type 2 diabetes mellitus without complications: Secondary | ICD-10-CM | POA: Insufficient documentation

## 2023-08-19 ENCOUNTER — Encounter: Payer: Self-pay | Admitting: Family Medicine

## 2023-09-01 DIAGNOSIS — I781 Nevus, non-neoplastic: Secondary | ICD-10-CM | POA: Diagnosis not present

## 2023-09-01 DIAGNOSIS — L918 Other hypertrophic disorders of the skin: Secondary | ICD-10-CM | POA: Diagnosis not present

## 2023-09-01 DIAGNOSIS — L814 Other melanin hyperpigmentation: Secondary | ICD-10-CM | POA: Diagnosis not present

## 2023-09-01 DIAGNOSIS — L718 Other rosacea: Secondary | ICD-10-CM | POA: Diagnosis not present

## 2023-09-01 DIAGNOSIS — L820 Inflamed seborrheic keratosis: Secondary | ICD-10-CM | POA: Diagnosis not present

## 2023-09-01 DIAGNOSIS — L821 Other seborrheic keratosis: Secondary | ICD-10-CM | POA: Diagnosis not present

## 2023-09-01 DIAGNOSIS — L738 Other specified follicular disorders: Secondary | ICD-10-CM | POA: Diagnosis not present

## 2023-09-01 DIAGNOSIS — L309 Dermatitis, unspecified: Secondary | ICD-10-CM | POA: Diagnosis not present

## 2023-09-08 ENCOUNTER — Ambulatory Visit (INDEPENDENT_AMBULATORY_CARE_PROVIDER_SITE_OTHER): Payer: Medicare Other

## 2023-09-08 DIAGNOSIS — Z23 Encounter for immunization: Secondary | ICD-10-CM | POA: Diagnosis not present

## 2023-10-01 DIAGNOSIS — H25813 Combined forms of age-related cataract, bilateral: Secondary | ICD-10-CM | POA: Diagnosis not present

## 2023-10-01 DIAGNOSIS — H18831 Recurrent erosion of cornea, right eye: Secondary | ICD-10-CM | POA: Diagnosis not present

## 2023-10-01 DIAGNOSIS — E119 Type 2 diabetes mellitus without complications: Secondary | ICD-10-CM | POA: Diagnosis not present

## 2023-10-01 DIAGNOSIS — H40023 Open angle with borderline findings, high risk, bilateral: Secondary | ICD-10-CM | POA: Diagnosis not present

## 2023-10-05 ENCOUNTER — Ambulatory Visit (INDEPENDENT_AMBULATORY_CARE_PROVIDER_SITE_OTHER): Payer: Medicare Other

## 2023-10-05 ENCOUNTER — Encounter: Payer: Self-pay | Admitting: Family Medicine

## 2023-10-05 VITALS — Ht 65.0 in | Wt 159.5 lb

## 2023-10-05 DIAGNOSIS — Z0000 Encounter for general adult medical examination without abnormal findings: Secondary | ICD-10-CM | POA: Diagnosis not present

## 2023-10-05 DIAGNOSIS — E785 Hyperlipidemia, unspecified: Secondary | ICD-10-CM

## 2023-10-05 NOTE — Patient Instructions (Addendum)
Laurie Hernandez , Thank you for taking time to come for your Medicare Wellness Visit. I appreciate your ongoing commitment to your health goals. Please review the following plan we discussed and let me know if I can assist you in the future.   Referrals/Orders/Follow-Ups/Clinician Recommendations:   This is a list of the screening recommended for you and due dates:  Health Maintenance  Topic Date Due   COVID-19 Vaccine (7 - 2023-24 season) 08/08/2023   Yearly kidney health urinalysis for diabetes  01/05/2024   Hemoglobin A1C  01/21/2024   Eye exam for diabetics  04/15/2024   Yearly kidney function blood test for diabetes  07/20/2024   Complete foot exam   07/20/2024   Medicare Annual Wellness Visit  10/04/2024   Mammogram  08/17/2025   Colon Cancer Screening  12/26/2026   DTaP/Tdap/Td vaccine (2 - Td or Tdap) 11/16/2028   Pneumonia Vaccine  Completed   Flu Shot  Completed   DEXA scan (bone density measurement)  Completed   Hepatitis C Screening  Completed   Zoster (Shingles) Vaccine  Completed   HPV Vaccine  Aged Out    Advanced directives: (Copy Requested) Please bring a copy of your health care power of attorney and living will to the office to be added to your chart at your convenience.  Next Medicare Annual Wellness Visit scheduled for next year: Yes

## 2023-10-05 NOTE — Progress Notes (Signed)
Subjective:   Laurie Hernandez is a 67 y.o. female who presents for Medicare Annual (Subsequent) preventive examination.  Visit Complete: Virtual I connected with  Kandra Nicolas on 10/05/23 by a audio enabled telemedicine application and verified that I am speaking with the correct person using two identifiers.  Patient Location: Home  Provider Location: Home Office  I discussed the limitations of evaluation and management by telemedicine. The patient expressed understanding and agreed to proceed.  Vital Signs: Because this visit was a virtual/telehealth visit, some criteria may be missing or patient reported. Any vitals not documented were not able to be obtained and vitals that have been documented are patient reported.   Cardiac Risk Factors include: advanced age (>69men, >6 women);diabetes mellitus     Objective:    Today's Vitals   10/05/23 1602  Weight: 159 lb 8 oz (72.3 kg)  Height: 5\' 5"  (1.651 m)   Body mass index is 26.54 kg/m.     10/05/2023    4:12 PM 09/16/2022    8:29 AM 12/26/2021    7:29 AM 09/13/2012   12:45 PM 09/08/2012    2:10 PM 08/16/2012    7:56 AM  Advanced Directives  Does Patient Have a Medical Advance Directive? Yes Yes Yes Patient does not have advance directive Patient does not have advance directive Patient does not have advance directive  Type of Advance Directive Healthcare Power of [place];Living will Healthcare Power of [place];Living will Healthcare Power of [place];Living will     Does patient want to make changes to medical advance directive?   No - Patient declined     Copy of Healthcare Power of Attorney in Chart? No - copy requested No - copy requested No - copy requested     Pre-existing out of facility DNR order (yellow form or pink MOST form)    No No No    Current Medications (verified) Outpatient Encounter Medications as of 10/05/2023  Medication Sig   B Complex-C (B-COMPLEX WITH VITAMIN C) tablet Take 1 tablet by  mouth daily.   Biotin 46962 MCG TABS Take 10,000 mcg by mouth daily.   Cholecalciferol (VITAMIN D3) 2000 units TABS Take 2,000 Units by mouth.   cycloSPORINE (RESTASIS) 0.05 % ophthalmic emulsion Place 1 drop into both eyes 2 (two) times daily.   empagliflozin (JARDIANCE) 25 MG TABS tablet Take 1 tablet (25 mg total) by mouth daily before breakfast.   glipiZIDE (GLIPIZIDE XL) 2.5 MG 24 hr tablet Take 1 tablet (2.5 mg total) by mouth daily with breakfast.   MAGNESIUM GLYCINATE PO Take by mouth.   Misc Natural Products (GLUCOSAMINE CHOND CMP ADVANCED PO) Take 1 Dose by mouth daily at 6 (six) AM.   Multiple Vitamins-Minerals (CENTRUM SILVER 50+WOMEN) TABS Take 1 tablet by mouth daily.   rosuvastatin (CRESTOR) 10 MG tablet Take 1 tablet (10 mg total) by mouth daily.   SUMAtriptan (IMITREX) 100 MG tablet Take 1 tab by mouth daily as needed for migraine.  Max 200 mg in 24 hours   No facility-administered encounter medications on file as of 10/05/2023.    Allergies (verified) Penicillins and Metformin and related   History: Past Medical History:  Diagnosis Date   Cataract    not a surgical candidate at this time (12/12/2021)   Colon polyps    Depression 2016   hx of-situational depression   Diabetes mellitus    on meds   DVT (deep venous thrombosis) (HCC) 1979   post Left Leg Fx  Headache(784.0)    migraines   Hepatitis    dx in 8th grade-not sure what type, can not donate blood.   History of chicken pox    childhood   History of kidney stones    no surgery required   Hyperlipidemia    on meds   Increased pressure in the eye    Rectal bleeding    SVD (spontaneous vaginal delivery)    x 1   UTI (lower urinary tract infection)    Past Surgical History:  Procedure Laterality Date   ABDOMINAL HYSTERECTOMY  2000   ANTERIOR AND POSTERIOR REPAIR  09/13/2012   Procedure: ANTERIOR (CYSTOCELE) AND POSTERIOR REPAIR (RECTOCELE);  Surgeon: Miguel Aschoff, MD;  Location: [facility];  Service:  Gynecology;  Laterality: N/A;   BREAST EXCISIONAL BIOPSY     BREAST SURGERY  1994   Left lumpectomy x 3 -  benigh   COLONOSCOPY  08/2012   @ [facility]-with Dr.Outlaw-MAC-good prep-TA   KNEE CARTILAGE SURGERY Left 2018   KNEE CARTILAGE SURGERY Left 2021   repair of meniscus repeat tear   TONSILLECTOMY     WISDOM TOOTH EXTRACTION     Family History  Problem Relation Age of Onset   Hyperlipidemia Mother        Living   Glaucoma Mother    Sarcoidosis Mother    Colon polyps Father 89   Arthritis/Rheumatoid Father 74       Deceased   Colon cancer Father 75   Heart disease Father    Hypertension Father    Diabetes Father    Thrombocytopenia Brother        #1   Leukemia Brother        #1   Hyperlipidemia Brother        #2   Sleep apnea Brother        #2   Sudden death Maternal Uncle    Heart attack Maternal Uncle    Ovarian cancer Maternal Grandmother    Breast cancer Maternal Grandmother    Depression Son    Social History   Socioeconomic History   Marital status: Married    Spouse name: Not on file   Number of children: Not on file   Years of education: Not on file   Highest education level: Not on file  Occupational History   Not on file  Tobacco Use   Smoking status: Former    Types: Cigarettes   Smokeless tobacco: Never   Tobacco comments:    Only when teenager for 1.5 years  Vaping Use   Vaping status: Never Used  Substance and Sexual Activity   Alcohol use: Not Currently    Alcohol/week: 0.0 - 3.0 standard drinks of alcohol    Comment: socially   Drug use: No   Sexual activity: Yes    Birth control/protection: None    Comment: hysterectomy  Other Topics Concern   Not on file  Social History Narrative   Not on file   Social Determinants of Health   Financial Resource Strain: Low Risk  (10/05/2023)   Overall Financial Resource Strain (CARDIA)    Difficulty of Paying Living Expenses: Not hard at all  Food Insecurity: No Food Insecurity (10/05/2023)    Hunger Vital Sign    Worried About Running Out of Food in the Last Year: Never true    Ran Out of Food in the Last Year: Never true  Transportation Needs: No Transportation Needs (10/05/2023)   PRAPARE - Transportation  Lack of Transportation (Medical): No    Lack of Transportation (Non-Medical): No  Physical Activity: Sufficiently Active (10/05/2023)   Exercise Vital Sign    Days of Exercise per Week: 3 days    Minutes of Exercise per Session: 150+ min  Stress: No Stress Concern Present (10/05/2023)   [employer] of Occupational Health - Occupational Stress Questionnaire    Feeling of Stress : Not at all  Social Connections: Moderately Isolated (10/05/2023)   Social Connection and Isolation Panel [NHANES]    Frequency of Communication with Friends and Family: More than three times a week    Frequency of Social Gatherings with Friends and Family: More than three times a week    Attends Religious Services: Never    Database administrator or Organizations: No    Attends Banker Meetings: Never    Marital Status: Married    Tobacco Counseling Counseling given: Not Answered Tobacco comments: Only when teenager for 1.5 years   Clinical Intake:  Pre-visit preparation completed: Yes  Pain : No/denies pain     BMI - recorded: 26.54 Nutritional Status: BMI 25 -29 Overweight Nutritional Risks: None Diabetes: Yes CBG done?: Yes (CBG 118 Per patient) CBG resulted in Enter/ Edit results?: Yes Did pt. bring in CBG monitor from home?: No  How often do you need to have someone help you when you read instructions, pamphlets, or other written materials from your doctor or pharmacy?: 1 - Never  Interpreter Needed?: No  Information entered by :: Theresa Mulligan LPN   Activities of Daily Living    10/05/2023    4:09 PM  In your present state of health, do you have any difficulty performing the following activities:  Hearing? 0  Vision? 0  Difficulty  concentrating or making decisions? 0  Walking or climbing stairs? 0  Dressing or bathing? 0  Doing errands, shopping? 0  Preparing Food and eating ? N  Using the Toilet? N  In the past six months, have you accidently leaked urine? N  Do you have problems with loss of bowel control? N  Managing your Medications? N  Managing your Finances? N  Housekeeping or managing your Housekeeping? N    Patient Care Team: Copland, Gwenlyn Found, MD as PCP - General (Family Medicine)  Indicate any recent Medical Services you may have received from other than [facility] providers in the past year (date may be approximate).     Assessment:   This is a routine wellness examination for [facility].  Hearing/Vision screen Hearing Screening - Comments:: Denies hearing difficulties   Vision Screening - Comments:: Wears rx glasses - up to date with routine eye exams with  Dr Zenaida Niece   Goals Addressed               This Visit's Progress     Retire! (pt-stated)         Depression Screen    10/05/2023    4:06 PM 07/21/2023    9:01 AM 09/16/2022    8:30 AM 03/11/2022    9:21 AM 06/18/2021    9:16 AM  PHQ 2/9 Scores  PHQ - 2 Score 0 0 0 0 0    Fall Risk    10/05/2023    4:10 PM 07/21/2023    9:01 AM 09/16/2022    8:29 AM 03/11/2022    9:21 AM 06/18/2021    9:16 AM  Fall Risk   Falls in the past year? 1 1 0 0 0  Number falls in past yr: 0 1 0 0 0  Injury with Fall? 1 1 0 0 0  Comment Fx: Left foot and Rt Hand. Followed by Medical      Risk for fall due to : No Fall Risks History of fall(s) No Fall Risks  No Fall Risks  Follow up Falls prevention discussed Falls evaluation completed Falls evaluation completed  Falls evaluation completed    MEDICARE RISK AT HOME: Medicare Risk at Home Any stairs in or around the home?: Yes If so, are there any without handrails?: No Home free of loose throw rugs in walkways, pet beds, electrical cords, etc?: Yes Adequate lighting in your home to reduce risk of falls?:  Yes Life alert?: No Use of a cane, walker or w/c?: No Grab bars in the bathroom?: Yes Shower chair or bench in shower?: Yes Elevated toilet seat or a handicapped toilet?: Yes  TIMED UP AND GO:  Was the test performed?  No    Cognitive Function:        10/05/2023    4:13 PM 09/16/2022    8:38 AM  6CIT Screen  What Year? 0 points 0 points  What month? 0 points 0 points  What time? 0 points 0 points  Count back from 20 0 points 0 points  Months in reverse 0 points 0 points  Repeat phrase 0 points 0 points  Total Score 0 points 0 points    Immunizations Immunization History  Administered Date(s) Administered   Fluad Quad(high Dose 65+) 08/28/2022   Fluad Trivalent(High Dose 65+) 09/08/2023   Influenza,inj,Quad PF,6+ Mos 08/02/2019   Influenza-Unspecified 10/08/2015, 11/01/2018, 09/09/2020, 08/21/2021   Moderna Covid-19 Vaccine Bivalent Booster 70yrs & up 12/27/2022   PFIZER(Purple Top)SARS-COV-2 Vaccination 01/27/2020, 02/20/2020, 09/09/2020, 05/02/2021   PNEUMOCOCCAL CONJUGATE-20 03/11/2022   Pfizer Covid-19 Vaccine Bivalent Booster 42yrs & up 10/16/2021   Pneumococcal Polysaccharide-23 11/16/2018   Tdap 11/16/2018   Zoster Recombinant(Shingrix) 08/02/2019, 10/18/2019    TDAP status: Up to date  Flu Vaccine status: Up to date  Pneumococcal vaccine status: Up to date  Covid-19 vaccine status: Declined, Education has been provided regarding the importance of this vaccine but patient still declined. Advised may receive this vaccine at local pharmacy or Health Dept.or vaccine clinic. Aware to provide a copy of the vaccination record if obtained from local pharmacy or Health Dept. Verbalized acceptance and understanding.  Qualifies for Shingles Vaccine? Yes   Zostavax completed Yes   Shingrix Completed?: Yes  Screening Tests Health Maintenance  Topic Date Due   COVID-19 Vaccine (7 - 2023-24 season) 08/08/2023   Diabetic kidney evaluation - Urine ACR  01/05/2024    HEMOGLOBIN A1C  01/21/2024   OPHTHALMOLOGY EXAM  04/15/2024   Diabetic kidney evaluation - eGFR measurement  07/20/2024   FOOT EXAM  07/20/2024   Medicare Annual Wellness (AWV)  10/04/2024   MAMMOGRAM  08/17/2025   Colonoscopy  12/26/2026   DTaP/Tdap/Td (2 - Td or Tdap) 11/16/2028   Pneumonia Vaccine 15+ Years old  Completed   INFLUENZA VACCINE  Completed   DEXA SCAN  Completed   Hepatitis C Screening  Completed   Zoster Vaccines- Shingrix  Completed   HPV VACCINES  Aged Out    Health Maintenance  Health Maintenance Due  Topic Date Due   COVID-19 Vaccine (7 - 2023-24 season) 08/08/2023    Colorectal cancer screening: Type of screening: Colonoscopy. Completed 12/26/21. Repeat every 5 years  Mammogram status: Completed 08/18/23. Repeat every year  Bone Density  status: Completed 10/20/23. Results reflect: Bone density results: OSTEOPENIA. Repeat every   years.    Additional Screening:  Hepatitis C Screening: does qualify; Completed 03/20/22  Vision Screening: Recommended annual ophthalmology exams for early detection of glaucoma and other disorders of the eye. Is the patient up to date with their annual eye exam?  Yes  Who is the provider or what is the name of the office in which the patient attends annual eye exams? Dr Zenaida Niece If pt is not established with a provider, would they like to be referred to a provider to establish care? No .   Dental Screening: Recommended annual dental exams for proper oral hygiene  Diabetic Foot Exam: Diabetic Foot Exam: Completed 07/21/23  Community Resource Referral / Chronic Care Management:  CRR required this visit?  No   CCM required this visit?  No     Plan:     I have personally reviewed and noted the following in the patient's chart:   Medical and social history Use of alcohol, tobacco or illicit drugs  Current medications and supplements including opioid prescriptions. Patient is not currently taking opioid  prescriptions. Functional ability and status Nutritional status Physical activity Advanced directives List of other physicians Hospitalizations, surgeries, and ER visits in previous 12 months Vitals Screenings to include cognitive, depression, and falls Referrals and appointments  In addition, I have reviewed and discussed with patient certain preventive protocols, quality metrics, and best practice recommendations. A written personalized care plan for preventive services as well as general preventive health recommendations were provided to patient.     Tillie Rung, LPN   16/09/9603   After Visit Summary: (MyChart) Due to this being a telephonic visit, the after visit summary with patients personalized plan was offered to patient via MyChart   Nurse Notes: None

## 2023-10-07 MED ORDER — ROSUVASTATIN CALCIUM 20 MG PO TABS
20.0000 mg | ORAL_TABLET | Freq: Every day | ORAL | 3 refills | Status: DC
Start: 2023-10-07 — End: 2024-06-12

## 2023-10-07 NOTE — Addendum Note (Signed)
Addended by: Abbe Amsterdam C on: 10/07/2023 08:17 AM   Modules accepted: Orders

## 2023-10-20 ENCOUNTER — Ambulatory Visit (HOSPITAL_BASED_OUTPATIENT_CLINIC_OR_DEPARTMENT_OTHER)
Admission: RE | Admit: 2023-10-20 | Discharge: 2023-10-20 | Disposition: A | Discharge status: Home / Self Care | Payer: Medicare Other | Source: Ambulatory Visit | Attending: Family Medicine | Admitting: Family Medicine

## 2023-10-20 ENCOUNTER — Encounter: Payer: Self-pay | Admitting: Family Medicine

## 2023-10-20 DIAGNOSIS — Z13820 Encounter for screening for osteoporosis: Secondary | ICD-10-CM | POA: Diagnosis not present

## 2023-10-20 DIAGNOSIS — Z7984 Long term (current) use of oral hypoglycemic drugs: Secondary | ICD-10-CM | POA: Diagnosis not present

## 2023-10-20 DIAGNOSIS — E119 Type 2 diabetes mellitus without complications: Secondary | ICD-10-CM | POA: Insufficient documentation

## 2023-10-20 DIAGNOSIS — Z780 Asymptomatic menopausal state: Secondary | ICD-10-CM | POA: Insufficient documentation

## 2023-10-20 DIAGNOSIS — M8588 Other specified disorders of bone density and structure, other site: Secondary | ICD-10-CM | POA: Insufficient documentation

## 2023-10-20 DIAGNOSIS — M8589 Other specified disorders of bone density and structure, multiple sites: Secondary | ICD-10-CM | POA: Diagnosis not present

## 2023-10-20 DIAGNOSIS — M8580 Other specified disorders of bone density and structure, unspecified site: Secondary | ICD-10-CM | POA: Insufficient documentation

## 2023-10-20 DIAGNOSIS — E559 Vitamin D deficiency, unspecified: Secondary | ICD-10-CM

## 2023-10-22 ENCOUNTER — Other Ambulatory Visit (INDEPENDENT_AMBULATORY_CARE_PROVIDER_SITE_OTHER): Payer: Medicare Other

## 2023-10-22 DIAGNOSIS — E559 Vitamin D deficiency, unspecified: Secondary | ICD-10-CM

## 2023-10-22 LAB — VITAMIN D 25 HYDROXY (VIT D DEFICIENCY, FRACTURES): VITD: 61.1 ng/mL (ref 30.00–100.00)

## 2023-11-08 ENCOUNTER — Other Ambulatory Visit: Payer: Self-pay | Admitting: Family Medicine

## 2023-11-08 DIAGNOSIS — E119 Type 2 diabetes mellitus without complications: Secondary | ICD-10-CM

## 2024-01-17 ENCOUNTER — Other Ambulatory Visit: Payer: Self-pay | Admitting: Family Medicine

## 2024-01-17 DIAGNOSIS — E119 Type 2 diabetes mellitus without complications: Secondary | ICD-10-CM

## 2024-03-17 ENCOUNTER — Other Ambulatory Visit: Payer: Self-pay | Admitting: Family Medicine

## 2024-03-17 DIAGNOSIS — E119 Type 2 diabetes mellitus without complications: Secondary | ICD-10-CM

## 2024-05-09 NOTE — Progress Notes (Unsigned)
  Healthcare at [facility] [address] [place], [place] [address] [phone]  Date:  05/10/2024   Name:  Laurie Hernandez   DOB:  [birth year]   MRN:  [id number]  PCP:  Kaylee Partridge, MD    Chief Complaint: No chief complaint on file.   History of Present Illness:  Laurie Hernandez is a 68 y.o. very pleasant female patient who presents with the following:  Patient seen today with concern of left leg pain.  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  Enjoys cycling outdoors for exercise  Can update urine micro and A1c if she would like Eye exam We did a coronary calcium  for her last year, she scored in the 60th percentile.  On Crestor  already, we increased her dosage from 10 to 20 mg  Lab Results  Component Value Date   HGBA1C 6.4 07/21/2023     Patient Active Problem List   Diagnosis Date Noted   Osteopenia 10/14/2021   History of vitamin D  deficiency 06/27/2016   History of kidney stones 11/30/2015   Migraine without aura and without status migrainosus, not intractable 01/20/2015   Type 2 diabetes mellitus without complication (HCC) 01/20/2015    Past Medical History:  Diagnosis Date   Cataract    not a surgical candidate at this time (12/12/2021)   Colon polyps    Depression 2016   hx of-situational depression   Diabetes mellitus    on meds   DVT (deep venous thrombosis) (HCC) 1979   post Left Leg Fx   Headache(784.0)    migraines   Hepatitis    dx in 8th grade-not sure what type, can not donate blood.   History of chicken pox    childhood   History of kidney stones    no surgery required   Hyperlipidemia    on meds   Increased pressure in the eye    Rectal bleeding    SVD (spontaneous vaginal delivery)    x 1   UTI (lower urinary tract infection)      Past Surgical History:  Procedure Laterality Date   ABDOMINAL HYSTERECTOMY  2000   ANTERIOR AND POSTERIOR REPAIR  09/13/2012   Procedure: ANTERIOR (CYSTOCELE) AND POSTERIOR REPAIR (RECTOCELE);  Surgeon: Deann Exon, MD;  Location: [facility];  Service: Gynecology;  Laterality: N/A;   BREAST EXCISIONAL BIOPSY     BREAST SURGERY  1994   Left lumpectomy x 3 -  benigh   COLONOSCOPY  08/2012   @ [facility]-with Dr.Outlaw-MAC-good prep-TA   KNEE CARTILAGE SURGERY Left 2018   KNEE CARTILAGE SURGERY Left 2021   repair of meniscus repeat tear   TONSILLECTOMY     WISDOM TOOTH EXTRACTION      Social History   Tobacco Use   Smoking status: Former    Types: Cigarettes   Smokeless tobacco: Never   Tobacco comments:    Only when teenager for 1.5 years  Vaping Use   Vaping status: Never Used  Substance Use Topics   Alcohol use: Not Currently    Alcohol/week: 0.0 - 3.0 standard drinks of alcohol    Comment: socially   Drug use: No    Family History  Problem Relation Age of Onset   Hyperlipidemia Mother        Living  Glaucoma Mother    Sarcoidosis Mother    Colon polyps Father 18   Arthritis/Rheumatoid Father 52       Deceased   Colon cancer Father 57   Heart disease Father    Hypertension Father    Diabetes Father    Thrombocytopenia Brother        #1   Leukemia Brother        #1   Hyperlipidemia Brother        #2   Sleep apnea Brother        #2   Sudden death Maternal Uncle    Heart attack Maternal Uncle    Ovarian cancer Maternal Grandmother    Breast cancer Maternal Grandmother    Depression Son     Allergies  Allergen Reactions   Penicillins Rash    As a child - can't remember   Metformin  And Related Nausea And Vomiting    Medication list has been reviewed and updated.  Current Outpatient Medications on File Prior to Visit  Medication Sig Dispense Refill   B Complex-C (B-COMPLEX WITH VITAMIN C) tablet Take 1 tablet by mouth daily.     Biotin 40981 MCG TABS  Take 10,000 mcg by mouth daily.     Cholecalciferol (VITAMIN D3) 2000 units TABS Take 2,000 Units by mouth.     cycloSPORINE (RESTASIS) 0.05 % ophthalmic emulsion Place 1 drop into both eyes 2 (two) times daily.     empagliflozin  (JARDIANCE ) 25 MG TABS tablet Take 1 tablet (25 mg total) by mouth daily before breakfast. 90 tablet 3   glipiZIDE  (GLUCOTROL  XL) 2.5 MG 24 hr tablet TAKE 1 TABLET BY MOUTH DAILY  WITH BREAKFAST 90 tablet 3   MAGNESIUM GLYCINATE PO Take by mouth.     Misc Natural Products (GLUCOSAMINE CHOND CMP ADVANCED PO) Take 1 Dose by mouth daily at 6 (six) AM.     Multiple Vitamins-Minerals (CENTRUM SILVER 50+WOMEN) TABS Take 1 tablet by mouth daily.     rosuvastatin  (CRESTOR ) 20 MG tablet Take 1 tablet (20 mg total) by mouth daily. 90 tablet 3   SUMAtriptan  (IMITREX ) 100 MG tablet Take 1 tab by mouth daily as needed for migraine.  Max 200 mg in 24 hours 9 tablet 6   No current facility-administered medications on file prior to visit.    Review of Systems:  As per HPI- otherwise negative.   Physical Examination: There were no vitals filed for this visit. There were no vitals filed for this visit. There is no height or weight on file to calculate BMI. Ideal Body Weight:    GEN: no acute distress. HEENT: Atraumatic, Normocephalic.  Ears and Nose: No external deformity. CV: RRR, No M/G/R. No JVD. No thrill. No extra heart sounds. PULM: CTA B, no wheezes, crackles, rhonchi. No retractions. No resp. distress. No accessory muscle use. ABD: S, NT, ND, +BS. No rebound. No HSM. EXTR: No c/c/e PSYCH: Normally interactive. Conversant.    Assessment and Plan: ***  Signed Gates Kasal, MD

## 2024-05-09 NOTE — Patient Instructions (Incomplete)
It was great to see you again today!  

## 2024-05-10 ENCOUNTER — Ambulatory Visit: Admitting: Family Medicine

## 2024-05-10 ENCOUNTER — Encounter: Payer: Self-pay | Admitting: Family Medicine

## 2024-05-10 ENCOUNTER — Ambulatory Visit (HOSPITAL_BASED_OUTPATIENT_CLINIC_OR_DEPARTMENT_OTHER)
Admission: RE | Admit: 2024-05-10 | Discharge: 2024-05-10 | Disposition: A | Discharge status: Home / Self Care | Source: Ambulatory Visit | Attending: Family Medicine | Admitting: Family Medicine

## 2024-05-10 ENCOUNTER — Telehealth: Payer: Self-pay | Admitting: Internal Medicine

## 2024-05-10 VITALS — BP 124/80 | HR 79 | Ht 65.0 in

## 2024-05-10 DIAGNOSIS — I82452 Acute embolism and thrombosis of left peroneal vein: Secondary | ICD-10-CM | POA: Diagnosis not present

## 2024-05-10 DIAGNOSIS — E119 Type 2 diabetes mellitus without complications: Secondary | ICD-10-CM

## 2024-05-10 DIAGNOSIS — E785 Hyperlipidemia, unspecified: Secondary | ICD-10-CM | POA: Diagnosis not present

## 2024-05-10 DIAGNOSIS — Z130 Encounter for screening for diseases of the blood and blood-forming organs and certain disorders involving the immune mechanism: Secondary | ICD-10-CM | POA: Diagnosis not present

## 2024-05-10 DIAGNOSIS — M79662 Pain in left lower leg: Secondary | ICD-10-CM

## 2024-05-10 DIAGNOSIS — E559 Vitamin D deficiency, unspecified: Secondary | ICD-10-CM | POA: Diagnosis not present

## 2024-05-10 LAB — CBC
HCT: 45.1 % (ref 36.0–46.0)
Hemoglobin: 15.3 g/dL — ABNORMAL HIGH (ref 12.0–15.0)
MCHC: 33.9 g/dL (ref 30.0–36.0)
MCV: 93 fl (ref 78.0–100.0)
Platelets: 230 10*3/uL (ref 150.0–400.0)
RBC: 4.84 Mil/uL (ref 3.87–5.11)
RDW: 12.9 % (ref 11.5–15.5)
WBC: 6.5 10*3/uL (ref 4.0–10.5)

## 2024-05-10 LAB — LIPID PANEL
Cholesterol: 202 mg/dL — ABNORMAL HIGH (ref 0–200)
HDL: 86.2 mg/dL (ref >39.00)
LDL Cholesterol: 89 mg/dL (ref 0–99)
NonHDL: 115.87
Total CHOL/HDL Ratio: 2
Triglycerides: 134 mg/dL (ref 0.0–149.0)
VLDL: 26.8 mg/dL (ref 0.0–40.0)

## 2024-05-10 LAB — COMPREHENSIVE METABOLIC PANEL WITH GFR
ALT: 19 U/L (ref 0–35)
AST: 20 U/L (ref 0–37)
Albumin: 4.7 g/dL (ref 3.5–5.2)
Alkaline Phosphatase: 73 U/L (ref 39–117)
BUN: 18 mg/dL (ref 6–23)
CO2: 31 meq/L (ref 19–32)
Calcium: 9.6 mg/dL (ref 8.4–10.5)
Chloride: 102 meq/L (ref 96–112)
Creatinine, Ser: 0.59 mg/dL (ref 0.40–1.20)
GFR: 92.25 mL/min (ref >60.00)
Glucose, Bld: 182 mg/dL — ABNORMAL HIGH (ref 70–99)
Potassium: 4.5 meq/L (ref 3.5–5.1)
Sodium: 141 meq/L (ref 135–145)
Total Bilirubin: 0.8 mg/dL (ref 0.2–1.2)
Total Protein: 7.2 g/dL (ref 6.0–8.3)

## 2024-05-10 LAB — MICROALBUMIN / CREATININE URINE RATIO
Creatinine,U: 38.9 mg/dL
Microalb Creat Ratio: UNDETERMINED mg/g (ref 0.0–30.0)
Microalb, Ur: 0.7 mg/dL (ref 0.0–1.9)

## 2024-05-10 LAB — HEMOGLOBIN A1C: Hgb A1c MFr Bld: 6.9 % — ABNORMAL HIGH (ref 4.6–6.5)

## 2024-05-10 MED ORDER — APIXABAN (ELIQUIS) VTE STARTER PACK (10MG AND 5MG): ORAL_TABLET | ORAL | 0 refills | Status: DC

## 2024-05-10 MED ORDER — CYCLOBENZAPRINE HCL 10 MG PO TABS
5.0000 mg | ORAL_TABLET | Freq: Every day | ORAL | 0 refills | Status: DC
Start: 2024-05-10 — End: 2024-10-10

## 2024-05-10 NOTE — Telephone Encounter (Signed)
 The ultrasound show a left leg DVT.  I spoke with the patient, she reports remote DVT in the same leg in the context of a fracture. Plan:  Starter package of Eliquis sent to her pharmacy , 1 month supply No NSAIDs Watch for signs of bleeding ER if chest pain, difficulty breathing, increased swelling Were forwarded this note to PCP for further advice.

## 2024-05-11 MED ORDER — GLIPIZIDE ER 5 MG PO TB24: 5.0000 mg | ORAL_TABLET | Freq: Every day | ORAL | 3 refills | Status: AC

## 2024-05-11 NOTE — Telephone Encounter (Signed)
 Lab Tech Name [facility] Lab Reference Number N/A Chief Complaint Lab Result (Critical or Stat) Call Type Lab Send to RN Reason for Call Report lab results Initial Comment Caller states she wants to report crital ultrasound. Translation No Nurse Assessment Nurse: Glendale Landmark, RN, Mario Sicard Date/Time (Eastern Time): 05/10/2024 8:01:00 PM You have opened lab assessment, do you wish to continue? ---Yes Is there an on-call provider listed? ---Yes Please list name of person reporting value (Lab Employee) and a contact number. ---Alison Applebaum [phone] Please document the following items: Lab name Lab value (read back to lab to verify) Reference range for lab value Date and time blood was drawn Collect time of birth for bilirubin results ---Name: Ultrasound Value: Left calf peroneal vein DVT Ref Range: Date & Time Collected: 05/10/24 @1943  Prev.: N/A Please collect the patient contact information from the lab. (name, phone number and address) ---Berniece [place], [place] [address] [phone] Disp. Time [facility] Time) Disposition Final User 05/10/2024 8:02:37 PM Lab Call Glendale Landmark, RN, Mario Sicard Reason: see lab assess tab 05/10/2024 8:09:47 PM Paged On Call back to [facility], [place], Mario Sicard 05/10/2024 8:31:55 PM Clinical Call Yes Glendale Landmark, RN, Mario Sicard Final Disposition 05/10/2024 8:31:55 PM Clinical Call Yes Glendale Landmark, RN, Mario Sicard PLEASE NOTE: All timestamps contained within this report are represented as [place] Standard Time. CONFIDENTIALTY NOTICE: This fax transmission is intended only for the addressee. It contains information that is legally privileged, confidential or otherwise protected from use or disclosure. If you are not the intended recipient, you are strictly prohibited from reviewing, disclosing, copying using or disseminating any of this information or taking any action in reliance on or regarding this information. If you have received this fax in error, please notify us  immediately by  telephone so that we can arrange for its return to us . Phone: [phone], Toll-Free: [phone], Fax: [phone] KATHERINE_VARNEY January 22, 1956 Page: 2 of 2 CallId: [id number] Paging DoctorName Phone DateTime Result/ Outcome Message Type Notes Devonna Foley - MD [phone] 05/10/2024 8:09:46 PM Paged On Call Back to Call Center Doctor Paged Critical Ultrasound. please call Mario Sicard RN [phone] Devonna Foley - MD 05/10/2024 8:19:02 PM Spoke with On Call - Outcome Notification Message Result OCP notified of critical lab and will contact the patient

## 2024-05-15 ENCOUNTER — Other Ambulatory Visit: Payer: Self-pay | Admitting: Family

## 2024-05-15 DIAGNOSIS — I82459 Acute embolism and thrombosis of unspecified peroneal vein: Secondary | ICD-10-CM

## 2024-05-15 DIAGNOSIS — D6859 Other primary thrombophilia: Secondary | ICD-10-CM

## 2024-05-16 ENCOUNTER — Inpatient Hospital Stay: Admitting: Family

## 2024-05-16 ENCOUNTER — Inpatient Hospital Stay: Attending: Hematology & Oncology

## 2024-05-16 VITALS — BP 123/70 | HR 81 | Ht 66.5 in | Wt 164.4 lb

## 2024-05-16 DIAGNOSIS — Z87442 Personal history of urinary calculi: Secondary | ICD-10-CM | POA: Insufficient documentation

## 2024-05-16 DIAGNOSIS — Z8349 Family history of other endocrine, nutritional and metabolic diseases: Secondary | ICD-10-CM | POA: Insufficient documentation

## 2024-05-16 DIAGNOSIS — I82402 Acute embolism and thrombosis of unspecified deep veins of left lower extremity: Secondary | ICD-10-CM | POA: Insufficient documentation

## 2024-05-16 DIAGNOSIS — Z818 Family history of other mental and behavioral disorders: Secondary | ICD-10-CM | POA: Insufficient documentation

## 2024-05-16 DIAGNOSIS — Z8261 Family history of arthritis: Secondary | ICD-10-CM | POA: Insufficient documentation

## 2024-05-16 DIAGNOSIS — Z880 Allergy status to penicillin: Secondary | ICD-10-CM | POA: Diagnosis not present

## 2024-05-16 DIAGNOSIS — Z833 Family history of diabetes mellitus: Secondary | ICD-10-CM | POA: Insufficient documentation

## 2024-05-16 DIAGNOSIS — Z825 Family history of asthma and other chronic lower respiratory diseases: Secondary | ICD-10-CM

## 2024-05-16 DIAGNOSIS — I82459 Acute embolism and thrombosis of unspecified peroneal vein: Secondary | ICD-10-CM

## 2024-05-16 DIAGNOSIS — Z800 Family history of malignant neoplasm of digestive organs: Secondary | ICD-10-CM | POA: Diagnosis not present

## 2024-05-16 DIAGNOSIS — Z8249 Family history of ischemic heart disease and other diseases of the circulatory system: Secondary | ICD-10-CM | POA: Diagnosis not present

## 2024-05-16 DIAGNOSIS — Z83511 Family history of glaucoma: Secondary | ICD-10-CM | POA: Diagnosis not present

## 2024-05-16 DIAGNOSIS — D6859 Other primary thrombophilia: Secondary | ICD-10-CM | POA: Diagnosis not present

## 2024-05-16 DIAGNOSIS — Z832 Family history of diseases of the blood and blood-forming organs and certain disorders involving the immune mechanism: Secondary | ICD-10-CM

## 2024-05-16 DIAGNOSIS — Z79899 Other long term (current) drug therapy: Secondary | ICD-10-CM | POA: Insufficient documentation

## 2024-05-16 DIAGNOSIS — E119 Type 2 diabetes mellitus without complications: Secondary | ICD-10-CM

## 2024-05-16 DIAGNOSIS — Z803 Family history of malignant neoplasm of breast: Secondary | ICD-10-CM | POA: Diagnosis not present

## 2024-05-16 DIAGNOSIS — K5730 Diverticulosis of large intestine without perforation or abscess without bleeding: Secondary | ICD-10-CM | POA: Insufficient documentation

## 2024-05-16 DIAGNOSIS — Z8269 Family history of other diseases of the musculoskeletal system and connective tissue: Secondary | ICD-10-CM

## 2024-05-16 DIAGNOSIS — Z8041 Family history of malignant neoplasm of ovary: Secondary | ICD-10-CM | POA: Insufficient documentation

## 2024-05-16 DIAGNOSIS — Z806 Family history of leukemia: Secondary | ICD-10-CM

## 2024-05-16 DIAGNOSIS — K644 Residual hemorrhoidal skin tags: Secondary | ICD-10-CM | POA: Diagnosis not present

## 2024-05-16 DIAGNOSIS — Z87891 Personal history of nicotine dependence: Secondary | ICD-10-CM | POA: Diagnosis not present

## 2024-05-16 DIAGNOSIS — Z86718 Personal history of other venous thrombosis and embolism: Secondary | ICD-10-CM | POA: Diagnosis not present

## 2024-05-16 DIAGNOSIS — Z83719 Family history of colon polyps, unspecified: Secondary | ICD-10-CM | POA: Insufficient documentation

## 2024-05-16 DIAGNOSIS — Z7901 Long term (current) use of anticoagulants: Secondary | ICD-10-CM | POA: Diagnosis not present

## 2024-05-16 DIAGNOSIS — Z90710 Acquired absence of both cervix and uterus: Secondary | ICD-10-CM | POA: Insufficient documentation

## 2024-05-16 DIAGNOSIS — Z87440 Personal history of urinary (tract) infections: Secondary | ICD-10-CM | POA: Insufficient documentation

## 2024-05-16 DIAGNOSIS — K648 Other hemorrhoids: Secondary | ICD-10-CM | POA: Insufficient documentation

## 2024-05-16 DIAGNOSIS — Z860100 Personal history of colon polyps, unspecified: Secondary | ICD-10-CM | POA: Diagnosis not present

## 2024-05-16 DIAGNOSIS — R768 Other specified abnormal immunological findings in serum: Secondary | ICD-10-CM | POA: Insufficient documentation

## 2024-05-16 DIAGNOSIS — Z83438 Family history of other disorder of lipoprotein metabolism and other lipidemia: Secondary | ICD-10-CM | POA: Diagnosis not present

## 2024-05-16 LAB — CMP (CANCER CENTER ONLY)
ALT: 17 U/L (ref 0–44)
AST: 19 U/L (ref 15–41)
Albumin: 4.9 g/dL (ref 3.5–5.0)
Alkaline Phosphatase: 71 U/L (ref 38–126)
Anion gap: 11 (ref 5–15)
BUN: 18 mg/dL (ref 8–23)
CO2: 27 mmol/L (ref 22–32)
Calcium: 9.5 mg/dL (ref 8.9–10.3)
Chloride: 101 mmol/L (ref 98–111)
Creatinine: 0.71 mg/dL (ref 0.44–1.00)
GFR, Estimated: >60 mL/min (ref >60)
Glucose, Bld: 154 mg/dL — ABNORMAL HIGH (ref 70–99)
Potassium: 3.9 mmol/L (ref 3.5–5.1)
Sodium: 139 mmol/L (ref 135–145)
Total Bilirubin: 0.6 mg/dL (ref 0.0–1.2)
Total Protein: 7.7 g/dL (ref 6.5–8.1)

## 2024-05-16 LAB — CBC WITH DIFFERENTIAL (CANCER CENTER ONLY)
Abs Immature Granulocytes: 0.04 10*3/uL (ref 0.00–0.07)
Basophils Absolute: 0.1 10*3/uL (ref 0.0–0.1)
Basophils Relative: 1 %
Eosinophils Absolute: 0 10*3/uL (ref 0.0–0.5)
Eosinophils Relative: 1 %
HCT: 45.6 % (ref 36.0–46.0)
Hemoglobin: 15.7 g/dL — ABNORMAL HIGH (ref 12.0–15.0)
Immature Granulocytes: 1 %
Lymphocytes Relative: 27 %
Lymphs Abs: 2 10*3/uL (ref 0.7–4.0)
MCH: 31.4 pg (ref 26.0–34.0)
MCHC: 34.4 g/dL (ref 30.0–36.0)
MCV: 91.2 fL (ref 80.0–100.0)
Monocytes Absolute: 0.7 10*3/uL (ref 0.1–1.0)
Monocytes Relative: 9 %
Neutro Abs: 4.6 10*3/uL (ref 1.7–7.7)
Neutrophils Relative %: 61 %
Platelet Count: 240 10*3/uL (ref 150–400)
RBC: 5 MIL/uL (ref 3.87–5.11)
RDW: 12.3 % (ref 11.5–15.5)
WBC Count: 7.4 10*3/uL (ref 4.0–10.5)
nRBC: 0 % (ref 0.0–0.2)

## 2024-05-16 LAB — D-DIMER, QUANTITATIVE: D-Dimer, Quant: 0.41 ug{FEU}/mL (ref 0.00–0.50)

## 2024-05-16 LAB — ANTITHROMBIN III: AntiThromb III Func: 116 % (ref 75–120)
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
## 2024-05-18 LAB — BETA-2-GLYCOPROTEIN I ABS, IGG/M/A
Beta-2 Glyco I IgG: <9 GPI IgG units (ref 0–20)
Beta-2-Glycoprotein I IgA: <9 GPI IgA units (ref 0–25)
Beta-2-Glycoprotein I IgM: <9 GPI IgM units (ref 0–32)

## 2024-05-18 LAB — LUPUS ANTICOAGULANT PANEL
DRVVT: 53.2 s — ABNORMAL HIGH (ref 0.0–47.0)
PTT Lupus Anticoagulant: 31.8 s (ref 0.0–43.5)

## 2024-05-18 LAB — DRVVT CONFIRM: dRVVT Confirm: 1 ratio (ref 0.8–1.2)

## 2024-05-18 LAB — DRVVT MIX: dRVVT Mix: 45 s — ABNORMAL HIGH (ref 0.0–40.4)

## 2024-05-18 LAB — CARDIOLIPIN ANTIBODIES, IGG, IGM, IGA
Anticardiolipin IgA: <9 U/mL (ref 0–11)
Anticardiolipin IgG: <9 GPL U/mL (ref 0–14)
Anticardiolipin IgM: 15 [MPL'U]/mL — ABNORMAL HIGH (ref 0–12)

## 2024-05-18 LAB — PROTEIN S ACTIVITY: Protein S Activity: 96 % (ref 63–140)

## 2024-05-18 LAB — PROTEIN C, TOTAL: Protein C, Total: 98 % (ref 60–150)

## 2024-05-18 LAB — PROTEIN C ACTIVITY: Protein C Activity: 122 % (ref 73–180)

## 2024-05-18 LAB — PROTEIN S, TOTAL: Protein S Ag, Total: 89 % (ref 60–150)

## 2024-05-22 LAB — FACTOR 5 LEIDEN

## 2024-05-22 LAB — PROTHROMBIN GENE MUTATION

## 2024-05-22 MED ORDER — APIXABAN 5 MG PO TABS: 5.0000 mg | ORAL_TABLET | Freq: Two times a day (BID) | ORAL | 6 refills | Status: DC

## 2024-05-25 LAB — HOMOCYSTEINE

## 2024-05-26 ENCOUNTER — Ambulatory Visit: Payer: Self-pay

## 2024-05-26 ENCOUNTER — Other Ambulatory Visit: Payer: Self-pay

## 2024-05-26 DIAGNOSIS — D6859 Other primary thrombophilia: Secondary | ICD-10-CM

## 2024-05-26 DIAGNOSIS — I82459 Acute embolism and thrombosis of unspecified peroneal vein: Secondary | ICD-10-CM

## 2024-05-26 LAB — HOMOCYSTEINE: Homocysteine: 12.9 umol/L (ref 0.0–17.2)

## 2024-06-10 ENCOUNTER — Other Ambulatory Visit: Payer: Self-pay | Admitting: Family Medicine

## 2024-06-10 DIAGNOSIS — E785 Hyperlipidemia, unspecified: Secondary | ICD-10-CM

## 2024-07-18 ENCOUNTER — Other Ambulatory Visit: Payer: Self-pay | Admitting: Family Medicine

## 2024-07-18 ENCOUNTER — Other Ambulatory Visit (HOSPITAL_BASED_OUTPATIENT_CLINIC_OR_DEPARTMENT_OTHER)

## 2024-07-18 DIAGNOSIS — E119 Type 2 diabetes mellitus without complications: Secondary | ICD-10-CM

## 2024-07-21 ENCOUNTER — Ambulatory Visit (HOSPITAL_BASED_OUTPATIENT_CLINIC_OR_DEPARTMENT_OTHER)
Admission: RE | Admit: 2024-07-21 | Discharge: 2024-07-21 | Disposition: A | Discharge status: Home / Self Care | Source: Ambulatory Visit | Attending: Family | Admitting: Family

## 2024-07-21 ENCOUNTER — Other Ambulatory Visit: Payer: Self-pay | Admitting: Family

## 2024-07-21 ENCOUNTER — Inpatient Hospital Stay: Attending: Family

## 2024-07-21 ENCOUNTER — Inpatient Hospital Stay (HOSPITAL_BASED_OUTPATIENT_CLINIC_OR_DEPARTMENT_OTHER): Admitting: Family

## 2024-07-21 VITALS — BP 124/76 | HR 81 | Temp 98.1°F | Resp 17 | Ht 64.5 in | Wt 164.4 lb

## 2024-07-21 DIAGNOSIS — I82459 Acute embolism and thrombosis of unspecified peroneal vein: Secondary | ICD-10-CM | POA: Insufficient documentation

## 2024-07-21 DIAGNOSIS — Z79899 Other long term (current) drug therapy: Secondary | ICD-10-CM | POA: Insufficient documentation

## 2024-07-21 DIAGNOSIS — Z880 Allergy status to penicillin: Secondary | ICD-10-CM | POA: Insufficient documentation

## 2024-07-21 DIAGNOSIS — Z86718 Personal history of other venous thrombosis and embolism: Secondary | ICD-10-CM | POA: Insufficient documentation

## 2024-07-21 DIAGNOSIS — Z7901 Long term (current) use of anticoagulants: Secondary | ICD-10-CM | POA: Diagnosis not present

## 2024-07-21 DIAGNOSIS — I82452 Acute embolism and thrombosis of left peroneal vein: Secondary | ICD-10-CM | POA: Diagnosis not present

## 2024-07-21 DIAGNOSIS — D6859 Other primary thrombophilia: Secondary | ICD-10-CM | POA: Insufficient documentation

## 2024-07-21 DIAGNOSIS — I82402 Acute embolism and thrombosis of unspecified deep veins of left lower extremity: Secondary | ICD-10-CM | POA: Diagnosis not present

## 2024-07-21 LAB — CMP (CANCER CENTER ONLY)
ALT: 18 U/L (ref 0–44)
AST: 25 U/L (ref 15–41)
Albumin: 4.6 g/dL (ref 3.5–5.0)
Alkaline Phosphatase: 76 U/L (ref 38–126)
Anion gap: 10 (ref 5–15)
BUN: 12 mg/dL (ref 8–23)
CO2: 27 mmol/L (ref 22–32)
Calcium: 9.6 mg/dL (ref 8.9–10.3)
Chloride: 104 mmol/L (ref 98–111)
Creatinine: 0.65 mg/dL (ref 0.44–1.00)
GFR, Estimated: >60 mL/min (ref >60)
Glucose, Bld: 141 mg/dL — ABNORMAL HIGH (ref 70–99)
Potassium: 4.5 mmol/L (ref 3.5–5.1)
Sodium: 141 mmol/L (ref 135–145)
Total Bilirubin: 0.6 mg/dL (ref 0.0–1.2)
Total Protein: 7.2 g/dL (ref 6.5–8.1)

## 2024-07-21 LAB — CBC WITH DIFFERENTIAL (CANCER CENTER ONLY)
Abs Immature Granulocytes: 0.01 K/uL (ref 0.00–0.07)
Basophils Absolute: 0 K/uL (ref 0.0–0.1)
Basophils Relative: 1 %
Eosinophils Absolute: 0 K/uL (ref 0.0–0.5)
Eosinophils Relative: 1 %
HCT: 44.1 % (ref 36.0–46.0)
Hemoglobin: 15.2 g/dL — ABNORMAL HIGH (ref 12.0–15.0)
Immature Granulocytes: 0 %
Lymphocytes Relative: 28 %
Lymphs Abs: 2 K/uL (ref 0.7–4.0)
MCH: 31.7 pg (ref 26.0–34.0)
MCHC: 34.5 g/dL (ref 30.0–36.0)
MCV: 91.9 fL (ref 80.0–100.0)
Monocytes Absolute: 0.7 K/uL (ref 0.1–1.0)
Monocytes Relative: 10 %
Neutro Abs: 4.4 K/uL (ref 1.7–7.7)
Neutrophils Relative %: 60 %
Platelet Count: 215 K/uL (ref 150–400)
RBC: 4.8 MIL/uL (ref 3.87–5.11)
RDW: 12.2 % (ref 11.5–15.5)
WBC Count: 7.3 K/uL (ref 4.0–10.5)
nRBC: 0 % (ref 0.0–0.2)

## 2024-07-21 LAB — LACTATE DEHYDROGENASE: LDH: 167 U/L (ref 98–192)

## 2024-07-21 NOTE — Progress Notes (Signed)
 Hematology and Oncology Follow Up Visit  Laurie Hernandez [id number] [birth year] 68 y.o. 07/21/2024   Principle Diagnosis:  Recurrent LLE DVT  Current Therapy:   Eliquis  5 mg PO BID    Interim History:  Laurie Hernandez is here today for follow-up. She is doing well and notes that the pain and swelling in her legs is much improved.  She is excited to start bike again and is wearing a helmet and gloves for protection.  No fever, chills, n/v, cough, rash, dizziness, SOB, chest pain, palpitations, abdominal pain or changes in bowel or bladder habits.  No abnormal blood loss. No petechiae. She does bruise easily on her arms but not in excess.  No numbness or tingling in her extremities.  No falls or syncope reported.  Appetite and hydration are good. Weight is stable at 164 lbs.   ECOG Performance Status: 1 - Symptomatic but completely ambulatory  Medications:  Allergies as of 07/21/2024       Reactions   Penicillins Rash   As a child - can't remember   Metformin  And Related Nausea And Vomiting        Medication List        Accurate as of July 21, 2024  1:40 PM. If you have any questions, ask your nurse or doctor.          apixaban  5 MG Tabs tablet Commonly known as: ELIQUIS  Take 1 tablet (5 mg total) by mouth 2 (two) times daily.   B-complex with vitamin C tablet Take 1 tablet by mouth daily.   Biotin 10000 MCG Tabs Take 10,000 mcg by mouth daily.   Calcium  250 MG Caps Take 300 mg by mouth in the morning and at bedtime.   Centrum Silver 50+Women Tabs Take 1 tablet by mouth daily.   cyclobenzaprine  10 MG tablet Commonly known as: FLEXERIL  Take 0.5-1 tablets (5-10 mg total) by mouth at bedtime. Use as needed for calf pain and cramping   cycloSPORINE 0.05 % ophthalmic emulsion Commonly known as: RESTASIS Place 1 drop into both eyes 2 (two) times daily.   glipiZIDE  5 MG 24 hr tablet Commonly known as: GLUCOTROL  XL Take 1 tablet (5 mg total) by mouth daily  with breakfast.   Jardiance  25 MG Tabs tablet Generic drug: empagliflozin  TAKE 1 TABLET BY MOUTH DAILY  BEFORE BREAKFAST   MAGNESIUM GLYCINATE PO Take by mouth.   rosuvastatin  20 MG tablet Commonly known as: CRESTOR  TAKE 1 TABLET BY MOUTH DAILY   SUMAtriptan  100 MG tablet Commonly known as: IMITREX  Take 1 tab by mouth daily as needed for migraine.  Max 200 mg in 24 hours   Vitamin D3 50 MCG (2000 UT) Tabs Take 2,000 Units by mouth.        Allergies:  Allergies  Allergen Reactions   Penicillins Rash    As a child - can't remember   Metformin  And Related Nausea And Vomiting    Past Medical History, Surgical history, Social history, and Family History were reviewed and updated.  Review of Systems: All other 10 point review of systems is negative.   Physical Exam:  vitals were not taken for this visit.   Wt Readings from Last 3 Encounters:  05/16/24 164 lb 6.4 oz (74.6 kg)  10/05/23 159 lb 8 oz (72.3 kg)  07/21/23 163 lb 12.8 oz (74.3 kg)    Ocular: Sclerae unicteric, pupils equal, round and reactive to light Ear-nose-throat: Oropharynx clear, dentition fair Lymphatic: No cervical or supraclavicular adenopathy Lungs  no rales or rhonchi, good excursion bilaterally Heart regular rate and rhythm, no murmur appreciated Abd soft, nontender, positive bowel sounds MSK no focal spinal tenderness, no joint edema Neuro: non-focal, well-oriented, appropriate affect Breasts: Deferred   Lab Results  Component Value Date   WBC 7.3 07/21/2024   HGB 15.2 (H) 07/21/2024   HCT 44.1 07/21/2024   MCV 91.9 07/21/2024   PLT 215 07/21/2024   No results found for: FERRITIN, IRON, TIBC, UIBC, IRONPCTSAT Lab Results  Component Value Date   RBC 4.80 07/21/2024   No results found for: KPAFRELGTCHN, LAMBDASER, KAPLAMBRATIO No results found for: IGGSERUM, IGA, IGMSERUM No results found for: STEPHANY CARLOTA BENSON MARKEL EARLA JOANNIE DOC VICK, SPEI   Chemistry      Component Value Date/Time   NA 139 05/16/2024 1443   K 3.9 05/16/2024 1443   CL 101 05/16/2024 1443   CO2 27 05/16/2024 1443   BUN 18 05/16/2024 1443   CREATININE 0.71 05/16/2024 1443   CREATININE 0.73 08/14/2020 1508      Component Value Date/Time   CALCIUM  9.5 05/16/2024 1443   ALKPHOS 71 05/16/2024 1443   AST 19 05/16/2024 1443   ALT 17 05/16/2024 1443   BILITOT 0.6 05/16/2024 1443       Impression and Plan:  Laurie Hernandez is a very pleasant 68 yo caucasian female with remote history of LLE DVT in 1981 and now a new LLE DVT. Hyper coag panel was negative.  She is doing well on Eliquis  5 mg PO BID and will continue her same regimen.  Follow-up with repeat US  in 4 months.   [name], NP 8/15/20251:41 PM

## 2024-08-05 ENCOUNTER — Other Ambulatory Visit: Payer: Self-pay | Admitting: Internal Medicine

## 2024-08-05 DIAGNOSIS — I82459 Acute embolism and thrombosis of unspecified peroneal vein: Secondary | ICD-10-CM

## 2024-08-05 DIAGNOSIS — D6859 Other primary thrombophilia: Secondary | ICD-10-CM

## 2024-08-15 ENCOUNTER — Ambulatory Visit (INDEPENDENT_AMBULATORY_CARE_PROVIDER_SITE_OTHER)

## 2024-08-15 DIAGNOSIS — Z23 Encounter for immunization: Secondary | ICD-10-CM | POA: Diagnosis not present

## 2024-08-15 NOTE — Progress Notes (Signed)
 Patient presents today for flu vaccine. She received it in right deltoid and tolerated vaccine well.

## 2024-09-06 DIAGNOSIS — Z129 Encounter for screening for malignant neoplasm, site unspecified: Secondary | ICD-10-CM | POA: Diagnosis not present

## 2024-09-06 DIAGNOSIS — L814 Other melanin hyperpigmentation: Secondary | ICD-10-CM | POA: Diagnosis not present

## 2024-09-06 DIAGNOSIS — L573 Poikiloderma of Civatte: Secondary | ICD-10-CM | POA: Diagnosis not present

## 2024-09-06 DIAGNOSIS — D225 Melanocytic nevi of trunk: Secondary | ICD-10-CM | POA: Diagnosis not present

## 2024-09-06 DIAGNOSIS — D369 Benign neoplasm, unspecified site: Secondary | ICD-10-CM | POA: Diagnosis not present

## 2024-09-06 DIAGNOSIS — L821 Other seborrheic keratosis: Secondary | ICD-10-CM | POA: Diagnosis not present

## 2024-09-22 ENCOUNTER — Other Ambulatory Visit (HOSPITAL_BASED_OUTPATIENT_CLINIC_OR_DEPARTMENT_OTHER): Payer: Self-pay | Admitting: Family Medicine

## 2024-09-22 DIAGNOSIS — Z139 Encounter for screening, unspecified: Secondary | ICD-10-CM

## 2024-10-04 ENCOUNTER — Encounter: Payer: Self-pay | Admitting: Family Medicine

## 2024-10-04 DIAGNOSIS — H35373 Puckering of macula, bilateral: Secondary | ICD-10-CM | POA: Diagnosis not present

## 2024-10-04 DIAGNOSIS — H40023 Open angle with borderline findings, high risk, bilateral: Secondary | ICD-10-CM | POA: Diagnosis not present

## 2024-10-04 DIAGNOSIS — E119 Type 2 diabetes mellitus without complications: Secondary | ICD-10-CM | POA: Diagnosis not present

## 2024-10-04 DIAGNOSIS — H40053 Ocular hypertension, bilateral: Secondary | ICD-10-CM | POA: Diagnosis not present

## 2024-10-04 DIAGNOSIS — H25813 Combined forms of age-related cataract, bilateral: Secondary | ICD-10-CM | POA: Diagnosis not present

## 2024-10-04 LAB — HM DIABETES EYE EXAM

## 2024-10-10 ENCOUNTER — Ambulatory Visit (INDEPENDENT_AMBULATORY_CARE_PROVIDER_SITE_OTHER)

## 2024-10-10 ENCOUNTER — Telehealth: Payer: Self-pay

## 2024-10-10 VITALS — Ht 64.5 in | Wt 164.0 lb

## 2024-10-10 DIAGNOSIS — Z0000 Encounter for general adult medical examination without abnormal findings: Secondary | ICD-10-CM | POA: Diagnosis not present

## 2024-10-10 DIAGNOSIS — E119 Type 2 diabetes mellitus without complications: Secondary | ICD-10-CM

## 2024-10-10 MED ORDER — EMPAGLIFLOZIN 25 MG PO TABS: 25.0000 mg | ORAL_TABLET | Freq: Every day | ORAL | 0 refills | Status: DC

## 2024-10-10 NOTE — Telephone Encounter (Signed)
 Patient is having labs drawn on 19th with [facility] and she would like to know if she can have her A1C checked then as well. Please advise.

## 2024-10-10 NOTE — Progress Notes (Addendum)
 I connected with  Laurie Hernandez on 10/16/24 by a audio enabled telemedicine application and verified that I am speaking with the correct person using two identifiers.  Patient Location: Home  Provider Location: Home Office  Persons Participating in Visit: Patient.  I discussed the limitations of evaluation and management by telemedicine. The patient expressed understanding and agreed to proceed.  Vital Signs: Because this visit was a virtual/telehealth visit, some criteria may be missing or patient reported. Any vitals not documented were not able to be obtained and vitals that have been documented are patient reported.   Subjective:   Laurie Hernandez is a 68 y.o. female who presents for a Medicare Annual Wellness Visit.  Allergies (verified) Penicillins and Metformin  and related   History: Past Medical History:  Diagnosis Date   Allergy    Penicillin   Metformin    Cataract    not a surgical candidate at this time (12/12/2021)   Colon polyps    Depression 2016   hx of-situational depression   Diabetes mellitus    on meds   DVT (deep venous thrombosis) (HCC) 1979   post Left Leg Fx   Headache(784.0)    migraines   Hepatitis    dx in 8th grade-not sure what type, can not donate blood.   History of chicken pox    childhood   History of kidney stones    no surgery required   Hyperlipidemia    on meds   Increased pressure in the eye    Rectal bleeding    SVD (spontaneous vaginal delivery)    x 1   UTI (lower urinary tract infection)    Past Surgical History:  Procedure Laterality Date   ABDOMINAL HYSTERECTOMY  2000   ANTERIOR AND POSTERIOR REPAIR  09/13/2012   Procedure: ANTERIOR (CYSTOCELE) AND POSTERIOR REPAIR (RECTOCELE);  Surgeon: Peggye Gull, MD;  Location: [facility];  Service: Gynecology;  Laterality: N/A;   BREAST EXCISIONAL BIOPSY     BREAST SURGERY  1994   Left lumpectomy x 3 -  benigh   COLONOSCOPY  08/2012   @ [facility]-with Dr.Outlaw-MAC-good prep-TA    KNEE CARTILAGE SURGERY Left 2018   KNEE CARTILAGE SURGERY Left 2021   repair of meniscus repeat tear   TONSILLECTOMY     WISDOM TOOTH EXTRACTION     Family History  Problem Relation Age of Onset   Hyperlipidemia Mother        Living   Glaucoma Mother    Sarcoidosis Mother    Colon polyps Father 20   Arthritis/Rheumatoid Father 74       Deceased   Colon cancer Father 72   Heart disease Father    Hypertension Father    Diabetes Father    Arthritis Father    Cancer Father    Thrombocytopenia Brother        #1   Hyperlipidemia Brother    Leukemia Brother        #1   Hyperlipidemia Brother        #2   Sleep apnea Brother        #2   Sudden death Maternal Uncle    Heart attack Maternal Uncle    Ovarian cancer Maternal Grandmother    Breast cancer Maternal Grandmother    Depression Son    Social History   Occupational History   Not on file  Tobacco Use   Smoking status: Former    Current packs/day: 0.00    Average packs/day: 0.3  packs/day for 1 year (0.3 ttl pk-yrs)    Types: Cigarettes    Quit date: 05/26/1975    Years since quitting: 49.4   Smokeless tobacco: Never   Tobacco comments:    Early as young adult - maybe 1 year total  Vaping Use   Vaping status: Never Used  Substance and Sexual Activity   Alcohol use: Yes    Alcohol/week: 3.0 - 6.0 standard drinks of alcohol    Types: 1 Glasses of wine, 2 Cans of beer per week    Comment: Socially   Drug use: No   Sexual activity: Yes    Birth control/protection: None    Comment: Hysterectomy   Tobacco Counseling Counseling given: Not Answered Tobacco comments: Early as young adult - maybe 1 year total  SDOH Screenings   Food Insecurity: No Food Insecurity (10/10/2024)  Housing: High Risk (10/10/2024)  Transportation Needs: No Transportation Needs (10/10/2024)  Utilities: Not At Risk (10/10/2024)  Alcohol Screen: Low Risk  (10/09/2024)  Depression (PHQ2-9): Low Risk  (10/10/2024)  Financial Resource Strain:  Low Risk  (10/09/2024)  Physical Activity: Sufficiently Active (10/10/2024)  Social Connections: Unknown (10/10/2024)  Stress: Patient Declined (10/10/2024)  Tobacco Use: Medium Risk (10/10/2024)  Health Literacy: Adequate Health Literacy (10/10/2024)   Depression Screen    10/10/2024   11:04 AM 07/21/2024    2:16 PM 10/05/2023    4:06 PM 07/21/2023    9:01 AM 09/16/2022    8:30 AM 03/11/2022    9:21 AM 06/18/2021    9:16 AM  PHQ 2/9 Scores  PHQ - 2 Score 0 0 0 0 0 0 0     Goals Addressed             This Visit's Progress    Patient Stated       Lose 10-15lbs.       Visit info / Clinical Intake: Medicare Wellness Visit Type:: Subsequent Annual Wellness Visit Medicare Wellness Visit Mode:: Telephone If telephone:: video declined If telephone or video:: pt reported vitals Interpreter Needed?: No Pre-visit prep was completed: yes AWV questionnaire completed by patient prior to visit?: no Living arrangements:: lives with spouse/significant other Patient's Overall Health Status Rating: good Typical amount of pain: some (DVT in calf) Does pain affect daily life?: no Are you currently prescribed opioids?: no  Dietary Habits and Nutritional Risks How many meals a day?: 2 (grazes in between) Eats fruit and vegetables daily?: yes Most meals are obtained by: preparing own meals Diabetic:: (!) yes Any non-healing wounds?: no How often do you check your BS?: as needed Would you like to be referred to a Nutritionist or for Diabetic Management? : no  Functional Status Activities of Daily Living (to include ambulation/medication): Independent Ambulation: Independent Medication Administration: Independent Home Management: Independent Manage your own finances?: yes Primary transportation is: driving Concerns about vision?: (!) yes Concerns about hearing?: (!) yes (does have ringing in ears) Uses hearing aids?: no  Fall Screening Falls in the past year?: 0 Number of falls in  past year: 0 Was there an injury with Fall?: 0 Fall Risk Category Calculator: 0 Patient Fall Risk Level: Low Fall Risk  Fall Risk Patient at Risk for Falls Due to: No Fall Risks Fall risk Follow up: Falls evaluation completed; Education provided  Home and Transportation Safety: All rugs have non-skid backing?: N/A, no rugs All stairs or steps have railings?: yes Grab bars in the bathtub or shower?: yes Have non-skid surface in bathtub or shower?: yes Good  home lighting?: yes Regular seat belt use?: yes Hospital stays in the last year:: no  Cognitive Assessment Difficulty concentrating, remembering, or making decisions? : no Will 6CIT or Mini Cog be Completed: yes What year is it?: 0 points What month is it?: 0 points About what time is it?: 0 points Count backwards from 20 to 1: 0 points Say the months of the year in reverse: 0 points Repeat the address phrase from earlier: 0 points 6 CIT Score: 0 points  Advance Directives (For Healthcare) Does Patient Have a Medical Advance Directive?: Yes Does patient want to make changes to medical advance directive?: No - Guardian declined Type of Advance Directive: Healthcare Power of [place]; Living will Copy of Healthcare Power of Attorney in Chart?: No - copy requested Copy of Living Will in Chart?: No - copy requested  Reviewed/Updated  Reviewed/Updated: All        Objective:    Today's Vitals   10/10/24 1053  Weight: 164 lb (74.4 kg)  Height: 5' 4.5 (1.638 m)   Body mass index is 27.72 kg/m.  Current Medications (verified) Outpatient Encounter Medications as of 10/10/2024  Medication Sig   apixaban  (ELIQUIS ) 5 MG TABS tablet Take 1 tablet (5 mg total) by mouth 2 (two) times daily.   B Complex-C (B-COMPLEX WITH VITAMIN C) tablet Take 1 tablet by mouth daily.   Biotin 89999 MCG TABS Take 10,000 mcg by mouth daily.   Calcium  250 MG CAPS Take 300 mg by mouth in the morning and at bedtime.   Cholecalciferol (VITAMIN  D3) 2000 units TABS Take 2,000 Units by mouth.   cycloSPORINE (RESTASIS) 0.05 % ophthalmic emulsion Place 1 drop into both eyes 2 (two) times daily.   empagliflozin  (JARDIANCE ) 25 MG TABS tablet TAKE 1 TABLET BY MOUTH DAILY  BEFORE BREAKFAST   glipiZIDE  (GLUCOTROL  XL) 5 MG 24 hr tablet Take 1 tablet (5 mg total) by mouth daily with breakfast.   MAGNESIUM GLYCINATE PO Take by mouth.   Multiple Vitamins-Minerals (CENTRUM SILVER 50+WOMEN) TABS Take 1 tablet by mouth daily.   rosuvastatin  (CRESTOR ) 20 MG tablet TAKE 1 TABLET BY MOUTH DAILY   SUMAtriptan  (IMITREX ) 100 MG tablet Take 1 tab by mouth daily as needed for migraine.  Max 200 mg in 24 hours   [DISCONTINUED] cyclobenzaprine  (FLEXERIL ) 10 MG tablet Take 0.5-1 tablets (5-10 mg total) by mouth at bedtime. Use as needed for calf pain and cramping   No facility-administered encounter medications on file as of 10/10/2024.   Hearing/Vision screen Hearing Screening - Comments:: Decreased hearing since COVID-sees audiologist Vision Screening - Comments:: Sees ophthalmology Dr Latisha and optometry for cataracts. Dr Cleotilde Immunizations and Health Maintenance Health Maintenance  Topic Date Due   FOOT EXAM  07/20/2024   COVID-19 Vaccine (8 - 2025-26 season) 08/07/2024   Medicare Annual Wellness (AWV)  10/04/2024   HEMOGLOBIN A1C  11/09/2024   Diabetic kidney evaluation - Urine ACR  05/10/2025   Diabetic kidney evaluation - eGFR measurement  07/21/2025   Mammogram  08/17/2025   OPHTHALMOLOGY EXAM  10/04/2025   Colonoscopy  12/26/2026   DTaP/Tdap/Td (2 - Td or Tdap) 11/16/2028   Pneumococcal Vaccine: 50+ Years  Completed   Influenza Vaccine  Completed   DEXA SCAN  Completed   Hepatitis C Screening  Completed   Zoster Vaccines- Shingrix   Completed   Meningococcal B Vaccine  Aged Out        Assessment/Plan:  This is a routine wellness examination for [facility].  Patient  Care Team: Copland, Harlene BROCKS, MD as PCP - General (Family  Medicine)  I have personally reviewed and noted the following in the patient's chart:   Medical and social history Use of alcohol, tobacco or illicit drugs  Current medications and supplements including opioid prescriptions. Functional ability and status Nutritional status Physical activity Advanced directives List of other physicians Hospitalizations, surgeries, and ER visits in previous 12 months Vitals Screenings to include cognitive, depression, and falls Referrals and appointments  No orders of the defined types were placed in this encounter.  In addition, I have reviewed and discussed with patient certain preventive protocols, quality metrics, and best practice recommendations. A written personalized care plan for preventive services as well as general preventive health recommendations were provided to patient.   Arnette LOISE Hoots, CMA   10/10/2024   No follow-ups on file.  After Visit Summary: (Declined) Due to this being a telephonic visit, with patients personalized plan was offered to patient but patient Declined AVS at this time   Nurse Notes: Patient is doing ok. She needed a refill on jardiance  and it has been sent.

## 2024-10-10 NOTE — Patient Instructions (Signed)
 Laurie Hernandez,  Thank you for taking the time for your Medicare Wellness Visit. I appreciate your continued commitment to your health goals. Please review the care plan we discussed, and feel free to reach out if I can assist you further.  Please note that Annual Wellness Visits do not include a physical exam. Some assessments may be limited, especially if the visit was conducted virtually. If needed, we may recommend an in-person follow-up with your provider.  Ongoing Care Seeing your primary care provider every 3 to 6 months helps us  monitor your health and provide consistent, personalized care.   Referrals If a referral was made during today's visit and you haven't received any updates within two weeks, please contact the referred provider directly to check on the status.  Recommended Screenings:  Health Maintenance  Topic Date Due   Complete foot exam   07/20/2024   COVID-19 Vaccine (8 - 2025-26 season) 08/07/2024   Medicare Annual Wellness Visit  10/04/2024   Hemoglobin A1C  11/09/2024   Yearly kidney health urinalysis for diabetes  05/10/2025   Yearly kidney function blood test for diabetes  07/21/2025   Breast Cancer Screening  08/17/2025   Eye exam for diabetics  10/04/2025   Colon Cancer Screening  12/26/2026   DTaP/Tdap/Td vaccine (2 - Td or Tdap) 11/16/2028   Pneumococcal Vaccine for age over 5  Completed   Flu Shot  Completed   DEXA scan (bone density measurement)  Completed   Hepatitis C Screening  Completed   Zoster (Shingles) Vaccine  Completed   Meningitis B Vaccine  Aged Out       10/10/2024   10:56 AM  Advanced Directives  Does Patient Have a Medical Advance Directive? Yes  Type of Estate Agent of [place];Living will  Does patient want to make changes to medical advance directive? No - Guardian declined  Copy of Healthcare Power of Attorney in Chart? No - copy requested    Vision: Annual vision screenings are recommended for early  detection of glaucoma, cataracts, and diabetic retinopathy. These exams can also reveal signs of chronic conditions such as diabetes and high blood pressure.  Dental: Annual dental screenings help detect early signs of oral cancer, gum disease, and other conditions linked to overall health, including heart disease and diabetes.  Please see the attached documents for additional preventive care recommendations.

## 2024-11-20 ENCOUNTER — Encounter (HOSPITAL_BASED_OUTPATIENT_CLINIC_OR_DEPARTMENT_OTHER): Payer: Self-pay

## 2024-11-20 ENCOUNTER — Inpatient Hospital Stay (HOSPITAL_BASED_OUTPATIENT_CLINIC_OR_DEPARTMENT_OTHER)
Admission: RE | Admit: 2024-11-20 | Discharge: 2024-11-20 | Disposition: A | Source: Ambulatory Visit | Attending: Family Medicine | Admitting: Family Medicine

## 2024-11-20 ENCOUNTER — Ambulatory Visit (HOSPITAL_BASED_OUTPATIENT_CLINIC_OR_DEPARTMENT_OTHER)
Admission: RE | Admit: 2024-11-20 | Discharge: 2024-11-20 | Disposition: A | Source: Ambulatory Visit | Attending: Family

## 2024-11-20 DIAGNOSIS — I82459 Acute embolism and thrombosis of unspecified peroneal vein: Secondary | ICD-10-CM | POA: Diagnosis present

## 2024-11-20 DIAGNOSIS — Z139 Encounter for screening, unspecified: Secondary | ICD-10-CM

## 2024-11-20 DIAGNOSIS — Z1231 Encounter for screening mammogram for malignant neoplasm of breast: Secondary | ICD-10-CM | POA: Diagnosis not present

## 2024-11-24 ENCOUNTER — Other Ambulatory Visit: Payer: Self-pay | Admitting: Family Medicine

## 2024-11-24 ENCOUNTER — Inpatient Hospital Stay: Attending: Family

## 2024-11-24 ENCOUNTER — Other Ambulatory Visit: Payer: Self-pay

## 2024-11-24 ENCOUNTER — Ambulatory Visit: Admitting: Family

## 2024-11-24 ENCOUNTER — Encounter: Payer: Self-pay | Admitting: Family

## 2024-11-24 VITALS — BP 116/76 | HR 65 | Temp 97.7°F | Resp 18 | Ht 64.5 in

## 2024-11-24 DIAGNOSIS — D6859 Other primary thrombophilia: Secondary | ICD-10-CM

## 2024-11-24 DIAGNOSIS — Z880 Allergy status to penicillin: Secondary | ICD-10-CM | POA: Insufficient documentation

## 2024-11-24 DIAGNOSIS — Z79899 Other long term (current) drug therapy: Secondary | ICD-10-CM | POA: Insufficient documentation

## 2024-11-24 DIAGNOSIS — I82459 Acute embolism and thrombosis of unspecified peroneal vein: Secondary | ICD-10-CM

## 2024-11-24 DIAGNOSIS — I82502 Chronic embolism and thrombosis of unspecified deep veins of left lower extremity: Secondary | ICD-10-CM | POA: Insufficient documentation

## 2024-11-24 DIAGNOSIS — I82402 Acute embolism and thrombosis of unspecified deep veins of left lower extremity: Secondary | ICD-10-CM | POA: Diagnosis present

## 2024-11-24 DIAGNOSIS — R928 Other abnormal and inconclusive findings on diagnostic imaging of breast: Secondary | ICD-10-CM

## 2024-11-24 DIAGNOSIS — Z7901 Long term (current) use of anticoagulants: Secondary | ICD-10-CM | POA: Diagnosis not present

## 2024-11-24 LAB — CBC WITH DIFFERENTIAL (CANCER CENTER ONLY)
Abs Immature Granulocytes: 0.03 K/uL (ref 0.00–0.07)
Basophils Absolute: 0.1 K/uL (ref 0.0–0.1)
Basophils Relative: 1 %
Eosinophils Absolute: 0.1 K/uL (ref 0.0–0.5)
Eosinophils Relative: 1 %
HCT: 43.4 % (ref 36.0–46.0)
Hemoglobin: 15 g/dL (ref 12.0–15.0)
Immature Granulocytes: 1 %
Lymphocytes Relative: 31 %
Lymphs Abs: 2 K/uL (ref 0.7–4.0)
MCH: 31.7 pg (ref 26.0–34.0)
MCHC: 34.6 g/dL (ref 30.0–36.0)
MCV: 91.8 fL (ref 80.0–100.0)
Monocytes Absolute: 0.8 K/uL (ref 0.1–1.0)
Monocytes Relative: 12 %
Neutro Abs: 3.7 K/uL (ref 1.7–7.7)
Neutrophils Relative %: 54 %
Platelet Count: 208 K/uL (ref 150–400)
RBC: 4.73 MIL/uL (ref 3.87–5.11)
RDW: 11.9 % (ref 11.5–15.5)
WBC Count: 6.6 K/uL (ref 4.0–10.5)
nRBC: 0 % (ref 0.0–0.2)

## 2024-11-24 LAB — CMP (CANCER CENTER ONLY)
ALT: 19 U/L (ref 0–44)
AST: 24 U/L (ref 15–41)
Albumin: 4.7 g/dL (ref 3.5–5.0)
Alkaline Phosphatase: 68 U/L (ref 38–126)
Anion gap: 12 (ref 5–15)
BUN: 13 mg/dL (ref 8–23)
CO2: 25 mmol/L (ref 22–32)
Calcium: 9.5 mg/dL (ref 8.9–10.3)
Chloride: 106 mmol/L (ref 98–111)
Creatinine: 0.62 mg/dL (ref 0.44–1.00)
GFR, Estimated: >60 mL/min
Glucose, Bld: 153 mg/dL — ABNORMAL HIGH (ref 70–99)
Potassium: 4.8 mmol/L (ref 3.5–5.1)
Sodium: 143 mmol/L (ref 135–145)
Total Bilirubin: 0.6 mg/dL (ref 0.0–1.2)
Total Protein: 7.4 g/dL (ref 6.5–8.1)

## 2024-11-24 NOTE — Progress Notes (Signed)
 " Hematology and Oncology Follow Up Visit  Laurie Hernandez [id number] [birth year] 68 y.o. 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 She is doing fairly well. Unfortunately she recently lost her sister in law to cancer and this has been a difficult season for she and her niece. They are being supportive of one another.  US  earlier this week showed chronic LLE thrombus. She is taking her Eliquis  5 mg PO BID.  She has had 1 nose bleed that stopped with using a tampon.  No abnormal bruising, no petechiae.  No fever, chills, n/v, cough, rash, dizziness, SOB, chest pain, palpitations, abdominal pain or changes  in bowel or bladder habits.  No swelling, tenderness, numbness or tingling in her extremities.  No falls or syncope reported.  Appetite and hydration are good.   ECOG Performance Status: 1 - Symptomatic but completely ambulatory  Medications:  Allergies as of 11/24/2024       Reactions   Penicillins Rash   As a child - can't remember   Metformin  And Related Nausea And Vomiting        Medication List        Accurate as of November 24, 2024 10:29 AM. If you have any questions, ask your nurse or doctor.          apixaban  5 MG Tabs tablet Commonly known as: Eliquis  Take 1 tablet (5 mg total) by mouth 2 (two) times daily.   B-complex with vitamin C tablet Take 1 tablet by mouth daily.   Biotin 10000 MCG Tabs Take 10,000 mcg by mouth daily.   Calcium  250 MG Caps Take 300 mg by mouth in the morning and at bedtime.   Centrum Silver 50+Women Tabs Take 1 tablet by mouth daily.   cycloSPORINE 0.05 % ophthalmic emulsion Commonly known as: RESTASIS Place 1 drop into both eyes 2 (two) times daily.   empagliflozin  25 MG Tabs tablet Commonly known as: Jardiance  Take 1 tablet (25 mg total) by mouth daily before  breakfast.   glipiZIDE  5 MG 24 hr tablet Commonly known as: GLUCOTROL  XL Take 1 tablet (5 mg total) by mouth daily with breakfast.   MAGNESIUM GLYCINATE PO Take by mouth.   rosuvastatin  20 MG tablet Commonly known as: CRESTOR  TAKE 1 TABLET BY MOUTH DAILY   SUMAtriptan  100 MG tablet Commonly known as: IMITREX  Take 1 tab by mouth daily as needed for migraine.  Max 200 mg in 24 hours   Vitamin D3 50 MCG (2000 UT) Tabs Take 2,000 Units by mouth.        Allergies: Allergies[1]  Past Medical History, Surgical history, Social history, and Family History were reviewed and updated.  Review of Systems: All other 10 point review of systems is negative.   Physical Exam:  vitals were not taken for this visit.   Wt Readings from Last 3 Encounters:  10/10/24 164 lb (74.4 kg)  07/21/24 164 lb 6.4 oz (74.6 kg)  05/16/24 164 lb 6.4 oz (74.6 kg)    Ocular: Sclerae unicteric, pupils equal, round and reactive to light Ear-nose-throat: Oropharynx clear, dentition fair Lymphatic: No cervical or supraclavicular adenopathy Lungs no rales or rhonchi, good excursion bilaterally Heart regular rate and rhythm, no murmur appreciated Abd soft, nontender, positive bowel sounds MSK no focal spinal tenderness, no  joint edema Neuro: non-focal, well-oriented, appropriate affect Breasts: Deferred  Lab Results  Component Value Date   WBC 6.6 11/24/2024   HGB 15.0 11/24/2024   HCT 43.4 11/24/2024   MCV 91.8 11/24/2024   PLT 208 11/24/2024   No results found for: FERRITIN, IRON, TIBC, UIBC, IRONPCTSAT Lab Results  Component Value Date   RBC 4.73 11/24/2024   No results found for: KPAFRELGTCHN, LAMBDASER, KAPLAMBRATIO No results found for: IGGSERUM, IGA, IGMSERUM No results found for: STEPHANY CARLOTA BENSON MARKEL EARLA JOANNIE DOC VICK, SPEI   Chemistry      Component Value Date/Time   NA 143 11/24/2024 0924   K 4.8 11/24/2024 0924    CL 106 11/24/2024 0924   CO2 25 11/24/2024 0924   BUN 13 11/24/2024 0924   CREATININE 0.62 11/24/2024 0924   CREATININE 0.73 08/14/2020 1508      Component Value Date/Time   CALCIUM  9.5 11/24/2024 0924   ALKPHOS 68 11/24/2024 0924   AST 24 11/24/2024 0924   ALT 19 11/24/2024 0924   BILITOT 0.6 11/24/2024 0924       Impression and Plan: Laurie Hernandez is a very pleasant 68 yo caucasian female with remote history of LLE DVT in 1981 and now a new LLE DVT. Hyper coag panel was negative.  She is doing well on Eliquis  5 mg PO BID and will continue her same regimen.  Follow-up in 6 months.   [name], NP 12/19/202510:29 AM     [1]  Allergies Allergen Reactions   Penicillins Rash    As a child - can't remember   Metformin  And Related Nausea And Vomiting   "

## 2024-12-01 LAB — CARDIOLIPIN ANTIBODIES, IGG, IGM, IGA
Anticardiolipin IgA: <9 U/mL (ref 0–11)
Anticardiolipin IgG: <9 GPL U/mL (ref 0–14)
Anticardiolipin IgM: <9 [MPL'U]/mL (ref 0–12)

## 2024-12-13 ENCOUNTER — Encounter: Payer: Self-pay | Admitting: Family

## 2024-12-13 ENCOUNTER — Other Ambulatory Visit: Payer: Self-pay

## 2024-12-13 ENCOUNTER — Other Ambulatory Visit: Payer: Self-pay | Admitting: Family

## 2024-12-13 DIAGNOSIS — D6859 Other primary thrombophilia: Secondary | ICD-10-CM

## 2024-12-13 DIAGNOSIS — I82459 Acute embolism and thrombosis of unspecified peroneal vein: Secondary | ICD-10-CM

## 2024-12-13 MED ORDER — APIXABAN 5 MG PO TABS: 5.0000 mg | ORAL_TABLET | Freq: Two times a day (BID) | ORAL | 3 refills | Status: DC

## 2024-12-13 MED ORDER — APIXABAN 5 MG PO TABS: 5.0000 mg | ORAL_TABLET | Freq: Two times a day (BID) | ORAL | 3 refills | Status: AC

## 2024-12-26 ENCOUNTER — Other Ambulatory Visit: Payer: Self-pay | Admitting: Family Medicine

## 2024-12-26 ENCOUNTER — Ambulatory Visit
Admission: RE | Admit: 2024-12-26 | Discharge: 2024-12-26 | Disposition: A | Source: Ambulatory Visit | Attending: Family Medicine | Admitting: Family Medicine

## 2024-12-26 DIAGNOSIS — R928 Other abnormal and inconclusive findings on diagnostic imaging of breast: Secondary | ICD-10-CM

## 2024-12-26 DIAGNOSIS — E119 Type 2 diabetes mellitus without complications: Secondary | ICD-10-CM

## 2024-12-27 ENCOUNTER — Ambulatory Visit
Admission: RE | Admit: 2024-12-27 | Discharge: 2024-12-27 | Disposition: A | Source: Ambulatory Visit | Attending: Family Medicine | Admitting: Family Medicine

## 2024-12-27 ENCOUNTER — Inpatient Hospital Stay: Admission: RE | Admit: 2024-12-27 | Discharge: 2024-12-27 | Attending: Family Medicine | Admitting: Family Medicine

## 2024-12-27 DIAGNOSIS — R928 Other abnormal and inconclusive findings on diagnostic imaging of breast: Secondary | ICD-10-CM

## 2024-12-27 HISTORY — PX: BREAST BIOPSY: SHX20

## 2024-12-28 LAB — SURGICAL PATHOLOGY

## 2024-12-29 ENCOUNTER — Telehealth: Payer: Self-pay | Admitting: *Deleted

## 2024-12-29 ENCOUNTER — Encounter: Payer: Self-pay | Admitting: *Deleted

## 2024-12-29 ENCOUNTER — Encounter: Payer: Self-pay | Admitting: Family Medicine

## 2024-12-29 DIAGNOSIS — C50919 Malignant neoplasm of unspecified site of unspecified female breast: Secondary | ICD-10-CM

## 2024-12-29 DIAGNOSIS — C50511 Malignant neoplasm of lower-outer quadrant of right female breast: Secondary | ICD-10-CM | POA: Insufficient documentation

## 2024-12-29 NOTE — Telephone Encounter (Signed)
 Spoke to patient to confirm upcoming afternoon [facility] clinic appointment on 1/28, paperwork will be sent via email  Gave location and time, also informed patient that the surgeon's office would be calling as well to get information from them similar to the packet that they will be receiving so make sure to do both.  Reminded patient that all providers will be coming to the clinic to see them HERE and if they had any questions to not hesitate to reach back out to myself or their navigators.

## 2025-01-02 NOTE — Telephone Encounter (Unsigned)
 Copied from CRM #[id number]. Topic: General - Other >> Dec 29, 2024 10:30 AM Alfonso HERO wrote: Reason for CRM: patient calling regard to her mychart message. I informed her that it has been sent to the doctor and she will respond to her when she see's it. >> Jan 02, 2025  2:36 PM Deleta RAMAN wrote: Patient is following up regarding message on mychart. States pcp needs to submit a referral to all the physicians she will be seen instead of one. Patient visit is scheduled for tomorrow and will need this completed for insurance approve with [employer]. Dr. Atilano and Dr. Shannon

## 2025-01-02 NOTE — Progress Notes (Signed)
 " Radiation Oncology         [phone] ________________________________  Multidisciplinary Breast Oncology Clinic [facility]) Initial Outpatient Consultation  Name: Laurie Hernandez MRN: [id number]  Date: 01/03/2025  DOB: 10-11-1956  RR:Rneojwi, Harlene BROCKS, MD  Curvin Deward MOULD, MD   REFERRING PHYSICIAN: Curvin Deward III, MD  DIAGNOSIS: There were no encounter diagnoses.  Stage IA (cT1c, cN0, cM0) Right Breast LOQ, Invasive lobular carcinoma w/ focal ALH, ER+ / PR+ / Her2-, Grade 2  No diagnosis found.  HISTORY OF PRESENT ILLNESS::Laurie Hernandez is a 69 y.o. female who is presenting to the office today for evaluation of her newly diagnosed breast cancer. She is accompanied by husband. She is doing well overall.   She had routine screening mammography on 11/20/24 showing a possible abnormality in the right breast. She underwent a unilateral right breast diagnostic mammography with tomography and right breast ultrasonography at [facility] on 12/26/24 showing: an architectural distortion in the inferior 6 o'clock right breast spanning approximately 2 cm without a sonographic correlate. No abnormal right axillary lymph nodes were demonstrated.   Biopsy of the lower outer right breast distortion on 12/27/24 showed: grade 2 invasive lobular carcinoma measuring 0.3 cm in the greatest linear extent of the sample, with focal ALH. Prognostic indicators significant for: estrogen receptor 100% positive and progesterone receptor 100% positive, both with strong staining intensity; Proliferation marker Ki67 at 10%; Her2 negative; Grade 2. No lymph nodes were examined.   Menarche: 69 years old Age at first live birth: 69 years old GP: 1 LMP: in 2000 after a Hysterectomy that year  Contraceptive: yes, for 1 year in 1979 HRT: used for 7 years; stopped in 2012    The patient was referred today for presentation in the multidisciplinary conference.  Radiology studies and pathology slides were  presented there for review and discussion of treatment options.  A consensus was discussed regarding potential next steps.  PREVIOUS RADIATION THERAPY: No  PAST MEDICAL HISTORY:  Past Medical History:  Diagnosis Date   Allergy    Penicillin   Metformin    Cataract    not a surgical candidate at this time (12/12/2021)   Colon polyps    Depression 2016   hx of-situational depression   Diabetes mellitus    on meds   DVT (deep venous thrombosis) (HCC) 1979   post Left Leg Fx   Headache(784.0)    migraines   Hepatitis    dx in 8th grade-not sure what type, can not donate blood.   History of chicken pox    childhood   History of kidney stones    no surgery required   Hyperlipidemia    on meds   Hypertension    Increased pressure in the eye    Rectal bleeding    SVD (spontaneous vaginal delivery)    x 1   UTI (lower urinary tract infection)     PAST SURGICAL HISTORY: Past Surgical History:  Procedure Laterality Date   ABDOMINAL HYSTERECTOMY  2000   ANTERIOR AND POSTERIOR REPAIR  09/13/2012   Procedure: ANTERIOR (CYSTOCELE) AND POSTERIOR REPAIR (RECTOCELE);  Surgeon: Peggye Gull, MD;  Location: [facility];  Service: Gynecology;  Laterality: N/A;   BREAST BIOPSY Right 12/27/2024   MM RT BREAST BX W LOC DEV 1ST LESION IMAGE BX SPEC STEREO GUIDE 12/27/2024 [facility] MAMMOGRAP[facility]   BREAST EXCISIONAL BIOPSY     BREAST SURGERY  1994   Left lumpectomy x 3 -  benigh   COLONOSCOPY  08/2012   @ [facility]-with Dr.Outlaw-MAC-good prep-TA   KNEE CARTILAGE SURGERY Left 2018   KNEE CARTILAGE SURGERY Left 2021   repair of meniscus repeat tear   TONSILLECTOMY     WISDOM TOOTH EXTRACTION      FAMILY HISTORY:  Family History  Problem Relation Age of Onset   Hyperlipidemia Mother        Living   Glaucoma Mother    Sarcoidosis Mother    Colon polyps Father 38   Arthritis/Rheumatoid Father 81       Deceased   Colon cancer Father 59   Heart disease Father    Hypertension Father    Diabetes Father     Arthritis Father    Cancer Father    Thrombocytopenia Brother        #1   Hyperlipidemia Brother    Leukemia Brother        #1   Hyperlipidemia Brother        #2   Sleep apnea Brother        #2   Ovarian cancer Maternal Grandmother    Breast cancer Maternal Grandmother    Depression Son    Sudden death Maternal Uncle    Heart attack Maternal Uncle     SOCIAL HISTORY:  Social History   Socioeconomic History   Marital status: Married    Spouse name: Not on file   Number of children: Not on file   Years of education: Not on file   Highest education level: Bachelor's degree (e.g., BA, AB, BS)  Occupational History   Not on file  Tobacco Use   Smoking status: Former    Current packs/day: 0.00    Average packs/day: 0.3 packs/day for 1 year (0.3 ttl pk-yrs)    Types: Cigarettes    Quit date: 05/26/1975    Years since quitting: 49.6   Smokeless tobacco: Never   Tobacco comments:    Early as young adult - maybe 1 year total  Vaping Use   Vaping status: Never Used  Substance and Sexual Activity   Alcohol use: Yes    Alcohol/week: 3.0 - 6.0 standard drinks of alcohol    Types: 1 Glasses of wine, 2 Cans of beer per week    Comment: Socially   Drug use: No   Sexual activity: Yes    Birth control/protection: None    Comment: Hysterectomy  Other Topics Concern   Not on file  Social History Narrative   Not on file   Social Drivers of Health   Tobacco Use: Medium Risk (01/03/2025)   Patient History    Smoking Tobacco Use: Former    Smokeless Tobacco Use: Never    Passive Exposure: Not on file  Financial Resource Strain: Low Risk (10/09/2024)   Overall Financial Resource Strain (CARDIA)    Difficulty of Paying Living Expenses: Not hard at all  Food Insecurity: No Food Insecurity (12/30/2024)   Epic    Worried About Radiation Protection Practitioner of Food in the Last Year: Never true    Ran Out of Food in the Last Year: Never true  Transportation Needs: No Transportation Needs  (12/30/2024)   Epic    Lack of Transportation (Medical): No    Lack of Transportation (Non-Medical): No  Physical Activity: Sufficiently Active (10/10/2024)   Exercise Vital Sign    Days of Exercise per Week: 4 days    Minutes of Exercise per Session: 90 min  Stress: Patient Declined (10/10/2024)   [employer] of Occupational  Health - Occupational Stress Questionnaire    Feeling of Stress: Patient declined  Social Connections: Unknown (10/10/2024)   Social Connection and Isolation Panel    Frequency of Communication with Friends and Family: Patient declined    Frequency of Social Gatherings with Friends and Family: Patient declined    Attends Religious Services: Patient declined    Active Member of Clubs or Organizations: Patient declined    Attends Banker Meetings: Never    Marital Status: Married  Depression (PHQ2-9): Low Risk (11/24/2024)   Depression (PHQ2-9)    PHQ-2 Score: 0  Alcohol Screen: Low Risk (10/09/2024)   Alcohol Screen    Last Alcohol Screening Score (AUDIT): 2  Housing: Unknown (01/03/2025)   Received from [facility]   Epic    Unable to Pay for Housing in the Last Year: Not on file    Number of Times Moved in the Last Year: Not on file    At any time in the past 12 months, were you homeless or living in a shelter (including now)?: No  Recent Concern: Housing - High Risk (10/10/2024)   Epic    Unable to Pay for Housing in the Last Year: No    Number of Times Moved in the Last Year: 0    Homeless in the Last Year: Yes  Utilities: Not At Risk (12/30/2024)   Epic    Threatened with loss of utilities: No  Health Literacy: Adequate Health Literacy (10/10/2024)   B1300 Health Literacy    Frequency of need for help with medical instructions: Never    ALLERGIES: Allergies[1]  MEDICATIONS:  Current Outpatient Medications  Medication Sig Dispense Refill   apixaban  (ELIQUIS ) 5 MG TABS tablet Take 1 tablet (5 mg total) by mouth 2  (two) times daily. 180 tablet 3   B Complex-C (B-COMPLEX WITH VITAMIN C) tablet Take 1 tablet by mouth daily.     Biotin 89999 MCG TABS Take 10,000 mcg by mouth daily.     Calcium  250 MG CAPS Take 300 mg by mouth in the morning and at bedtime.     Cholecalciferol (VITAMIN D3) 2000 units TABS Take 2,000 Units by mouth.     cycloSPORINE (RESTASIS) 0.05 % ophthalmic emulsion Place 1 drop into both eyes 2 (two) times daily.     glipiZIDE  (GLUCOTROL  XL) 5 MG 24 hr tablet Take 1 tablet (5 mg total) by mouth daily with breakfast. 90 tablet 3   JARDIANCE  25 MG TABS tablet TAKE 1 TABLET BY MOUTH DAILY  BEFORE BREAKFAST 100 tablet 2   MAGNESIUM GLYCINATE PO Take by mouth.     Multiple Vitamins-Minerals (CENTRUM SILVER 50+WOMEN) TABS Take 1 tablet by mouth daily.     rosuvastatin  (CRESTOR ) 20 MG tablet TAKE 1 TABLET BY MOUTH DAILY 100 tablet 2   SUMAtriptan  (IMITREX ) 100 MG tablet Take 1 tab by mouth daily as needed for migraine.  Max 200 mg in 24 hours 9 tablet 6   No current facility-administered medications for this encounter.    REVIEW OF SYSTEMS: A 10+ POINT REVIEW OF SYSTEMS WAS OBTAINED including neurology, dermatology, psychiatry, cardiac, respiratory, lymph, extremities, GI, GU, musculoskeletal, constitutional, reproductive, HEENT. On the provided form, she reports wearing glasses, wearing contacts, changes in vision, eye pain, hearing loss, ringing in ears, recurrent nose bleeds (reports having 4 within the last week), dental problems (reports having a cracked molar and needing crowns put in), dribbling urine (stress incontinence), a palpable breast lump since last weeks biopsy,  bruising easily, anxiety (related to her recent breast cancer diagnosis), diabetes, and a history of blood clots to her left lower extremity in June 2025. She denies any other symptoms.    PHYSICAL EXAM:   Lungs are clear to auscultation bilaterally. Heart has regular rate and rhythm. No palpable cervical, supraclavicular,  or axillary adenopathy. Abdomen soft, non-tender, normal bowel sounds. Breast: Left breast with no palpable mass, nipple discharge, or bleeding. Right breast with biopsy site in the upper outer quadrant with some associated bruising and induration present. Both breasts are large and pendulous.    01/03/2025  Vitals with BMI   Height 5' 4.5   Weight 164 lbs 11 oz   BMI 27.84   Systolic 112 !   Diastolic 59 !   Pulse 92     Legend: ! Abnormal   KPS = 90  100 - Normal; no complaints; no evidence of disease. 90   - Able to carry on normal activity; minor signs or symptoms of disease. 80   - Normal activity with effort; some signs or symptoms of disease. 66   - Cares for self; unable to carry on normal activity or to do active work. 60   - Requires occasional assistance, but is able to care for most of his personal needs. 50   - Requires considerable assistance and frequent medical care. 40   - Disabled; requires special care and assistance. 30   - Severely disabled; hospital admission is indicated although death not imminent. 20   - Very sick; hospital admission necessary; active supportive treatment necessary. 10   - Moribund; fatal processes progressing rapidly. 0     - Dead  Karnofsky DA, Abelmann [facility], Craver LS and Burchenal [facility] [phone]) The use of the nitrogen mustards in the palliative treatment of carcinoma: with particular reference to bronchogenic carcinoma Cancer 1 634-56  LABORATORY DATA:  Lab Results  Component Value Date   WBC 6.4 01/03/2025   HGB 15.1 (H) 01/03/2025   HCT 43.0 01/03/2025   MCV 89.4 01/03/2025   PLT 209 01/03/2025   Lab Results  Component Value Date   NA 140 01/03/2025   K 3.9 01/03/2025   CL 104 01/03/2025   CO2 23 01/03/2025   Lab Results  Component Value Date   ALT 18 01/03/2025   AST 25 01/03/2025   ALKPHOS 68 01/03/2025   BILITOT 0.6 01/03/2025    PULMONARY FUNCTION TEST:   Review Flowsheet        No data to display           RADIOGRAPHY: MM RT BREAST BX W LOC DEV 1ST LESION IMAGE BX SPEC STEREO GUIDE Addendum Date: 12/28/2024 ADDENDUM REPORT: 12/28/2024 11:43 ADDENDUM: PATHOLOGY revealed: 1. Breast, right, needle core biopsy, LOQ (ribbon clip)- INVASIVE MAMMARY CARCINOMA, FOCAL LOBULAR NEOPLASIA (ATYPICAL LOBULAR HYPERPLASIA, ALH)- OVERALL GRADE: 2- LYMPHOVASCULAR INVASION: NOT IDENTIFIED- CANCER LENGTH: 0.3 CM- CALCIFICATIONS: NOT IDENTIFIED. Pathology results are CONCORDANT with imaging findings, per Dr. Alm Parkins. Pathology results and recommendations below were discussed with patient by telephone. Patient reported biopsy site doing well with no adverse symptoms, and only slight tenderness at the site. Post biopsy care instructions were reviewed, questions were answered and my direct phone number was provided. Patient was instructed to call [facility] for any additional questions or concerns related to biopsy site. RECOMMENDATION: Surgical and oncological consultation. The patient was referred to [facility] with appointment on  01/03/25. Pathology results reported by Mliss CHARM Molt RN 12/28/2024. Electronically Signed   By: Alm Parkins M.D.   On: 12/28/2024 11:43   Result Date: 12/28/2024 CLINICAL DATA:  Patient presents for stereotactic core needle biopsy of a right breast architectural distortion. EXAM: RIGHT BREAST STEREOTACTIC CORE NEEDLE BIOPSY COMPARISON:  Previous exam(s). FINDINGS: The patient and I discussed the procedure of stereotactic-guided biopsy including benefits and alternatives. We discussed the high likelihood of a successful procedure. We discussed the risks of the procedure including infection, bleeding, tissue injury, clip migration, and inadequate sampling. Informed written consent was given. The usual time out protocol was performed immediately prior to the procedure. Using sterile technique and 1%  Lidocaine  as local anesthetic, under stereotactic guidance, a 9 gauge vacuum assisted device was used to perform core needle biopsy of the distortion in the lower outer right breast using a superior approach. Lesion quadrant: Lower outer quadrant At the conclusion of the procedure, a ribbon shaped tissue marker clip was deployed into the biopsy cavity. Follow-up 2-view mammogram was performed and dictated separately. IMPRESSION: Stereotactic-guided biopsy of a right breast architectural distortion. No apparent complications. Electronically Signed: By: Alm Parkins M.D. On: 12/27/2024 11:42   MM CLIP PLACEMENT RIGHT Result Date: 12/27/2024 CLINICAL DATA:  Status post stereotactic core needle biopsy of a right breast architectural distortion. Assess post biopsy marker clip placement. EXAM: 3D DIAGNOSTIC RIGHT MAMMOGRAM POST STEREOTACTIC BIOPSY COMPARISON:  Previous exam(s). ACR Breast Density Category c: The breasts are heterogeneously dense, which may obscure small masses. FINDINGS: 3D Mammographic images were obtained following stereotactic guided biopsy of right breast calcifications. The biopsy marking clip is in expected position at the site of biopsy. IMPRESSION: Appropriate positioning of the ribbon shaped biopsy marking clip at the site of biopsy in the lower, slightly outer right breast within the area of distortion. Final Assessment: Post Procedure Mammograms for Marker Placement Electronically Signed   By: Alm Parkins M.D.   On: 12/27/2024 11:51   MM 3D DIAGNOSTIC MAMMOGRAM UNILATERAL RIGHT BREAST Result Date: 12/26/2024 CLINICAL DATA:  Recall from screening for a possible right breast architectural distortion. EXAM: DIGITAL DIAGNOSTIC UNILATERAL RIGHT MAMMOGRAM WITH TOMOSYNTHESIS AND CAD; ULTRASOUND RIGHT BREAST LIMITED TECHNIQUE: Right digital diagnostic mammography and breast tomosynthesis was performed. The images were evaluated with computer-aided detection. ; Targeted ultrasound examination  of the right breast was performed COMPARISON:  Previous exam(s). ACR Breast Density Category c: The breasts are heterogeneously dense, which may obscure small masses. FINDINGS: On spot compression imaging, the possible distortion noted on the screening right breast cc view persists. The distortion projects in the inferior breast near 6 o'clock, spanning approximately 2 cm, converging on a small central point. This is not visualized, however, on the diagnostic mL images. On physical exam, no mass is palpated in the inferior right breast. Targeted ultrasound is performed, showing several dilated ducts with intraductal debris but no convincing mass or internal blood flow. There is no sonographic mass or distortion and correlate to the mammographic finding. Sonographic imaging of the right axilla demonstrates normal lymph nodes. No enlarged or abnormal nodes. IMPRESSION: 1. Architectural distortion in the inferior right breast without sonographic correlate. Tissue sampling is recommended. RECOMMENDATION: 1. Stereotactic core needle biopsy of the right breast architectural distortion. This procedure was scheduled [facility]. I have discussed the findings and recommendations with the patient. If applicable, a reminder letter will be sent to the patient regarding the next appointment. BI-RADS CATEGORY  4:  Suspicious. Electronically Signed   By: Alm Parkins M.D.   On: 12/26/2024 10:49   US  LIMITED ULTRASOUND INCLUDING AXILLA RIGHT BREAST Result Date: 12/26/2024 CLINICAL DATA:  Recall from screening for a possible right breast architectural distortion. EXAM: DIGITAL DIAGNOSTIC UNILATERAL RIGHT MAMMOGRAM WITH TOMOSYNTHESIS AND CAD; ULTRASOUND RIGHT BREAST LIMITED TECHNIQUE: Right digital diagnostic mammography and breast tomosynthesis was performed. The images were evaluated with computer-aided detection. ; Targeted ultrasound examination of the right breast was performed COMPARISON:   Previous exam(s). ACR Breast Density Category c: The breasts are heterogeneously dense, which may obscure small masses. FINDINGS: On spot compression imaging, the possible distortion noted on the screening right breast cc view persists. The distortion projects in the inferior breast near 6 o'clock, spanning approximately 2 cm, converging on a small central point. This is not visualized, however, on the diagnostic mL images. On physical exam, no mass is palpated in the inferior right breast. Targeted ultrasound is performed, showing several dilated ducts with intraductal debris but no convincing mass or internal blood flow. There is no sonographic mass or distortion and correlate to the mammographic finding. Sonographic imaging of the right axilla demonstrates normal lymph nodes. No enlarged or abnormal nodes. IMPRESSION: 1. Architectural distortion in the inferior right breast without sonographic correlate. Tissue sampling is recommended. RECOMMENDATION: 1. Stereotactic core needle biopsy of the right breast architectural distortion. This procedure was scheduled prior to the [facility] leaving the [facility]. I have discussed the findings and recommendations with the patient. If applicable, a reminder letter will be sent to the patient regarding the next appointment. BI-RADS CATEGORY  4: Suspicious. Electronically Signed   By: Alm Parkins M.D.   On: 12/26/2024 10:49      IMPRESSION: Stage IA (cT1c, cN0, cM0) Right Breast LOQ, Invasive lobular carcinoma w/ focal ALH, ER+ / PR+ / Her2-, Grade 2  Patient will be a good candidate for breast conservation with radiotherapy to the right breast. We discussed the general course of radiation, potential side effects, and toxicities with radiation and the patient is interested in this approach. ***   PLAN:  Genetics CEM Right breast lumpectomy with right axillary SLN excisions    Oncotype  Adjuvant radiation therapy  Aromatase Inhibitor   ------------------------------------------------  Lynwood CHARM Nasuti, PhD, MD  This document serves as a record of services personally performed by Lynwood Nasuti, MD. It was created on his behalf by Dorthy Fuse, a trained medical scribe. The creation of this record is based on the scribe's personal observations and the provider's statements to them. This document has been checked and approved by the attending provider.     [1]  Allergies Allergen Reactions   Penicillins Rash    As a child - can't remember   Metformin  And Related Nausea And Vomiting   "

## 2025-01-02 NOTE — Telephone Encounter (Signed)
 Copied from CRM #[id number]. Topic: General - Other >> Jan 02, 2025  2:36 PM Deleta RAMAN wrote: Patient is following up regarding message on mychart. States pcp needs to submit a referral to all the physicians she will be seen instead of one. Patient visit is scheduled for tomorrow and will need this completed for insurance approve with [employer]. Dr. Atilano and Dr. Shannon

## 2025-01-02 NOTE — Addendum Note (Signed)
 Addended by: WATT RAISIN C on: 01/02/2025 04:40 PM   Modules accepted: Orders

## 2025-01-03 ENCOUNTER — Ambulatory Visit
Admission: RE | Admit: 2025-01-03 | Discharge: 2025-01-03 | Disposition: A | Discharge status: Home / Self Care | Source: Ambulatory Visit | Attending: Radiation Oncology | Admitting: Radiation Oncology

## 2025-01-03 ENCOUNTER — Inpatient Hospital Stay

## 2025-01-03 ENCOUNTER — Inpatient Hospital Stay: Attending: Family

## 2025-01-03 ENCOUNTER — Inpatient Hospital Stay: Attending: Family | Admitting: Hematology and Oncology

## 2025-01-03 ENCOUNTER — Encounter: Payer: Self-pay | Admitting: Physical Therapy

## 2025-01-03 ENCOUNTER — Ambulatory Visit: Payer: Self-pay | Admitting: General Surgery

## 2025-01-03 ENCOUNTER — Ambulatory Visit: Attending: General Surgery | Admitting: Physical Therapy

## 2025-01-03 ENCOUNTER — Encounter: Payer: Self-pay | Admitting: *Deleted

## 2025-01-03 ENCOUNTER — Other Ambulatory Visit: Payer: Self-pay

## 2025-01-03 ENCOUNTER — Encounter: Payer: Self-pay | Admitting: General Practice

## 2025-01-03 VITALS — BP 112/59 | HR 92 | Temp 97.7°F | Resp 18 | Ht 64.5 in | Wt 164.7 lb

## 2025-01-03 DIAGNOSIS — Z87440 Personal history of urinary (tract) infections: Secondary | ICD-10-CM

## 2025-01-03 DIAGNOSIS — Z170 Estrogen receptor positive status [ER+]: Secondary | ICD-10-CM | POA: Diagnosis present

## 2025-01-03 DIAGNOSIS — Z79899 Other long term (current) drug therapy: Secondary | ICD-10-CM | POA: Diagnosis not present

## 2025-01-03 DIAGNOSIS — R293 Abnormal posture: Secondary | ICD-10-CM | POA: Diagnosis present

## 2025-01-03 DIAGNOSIS — Z1732 Human epidermal growth factor receptor 2 negative status: Secondary | ICD-10-CM | POA: Insufficient documentation

## 2025-01-03 DIAGNOSIS — Z832 Family history of diseases of the blood and blood-forming organs and certain disorders involving the immune mechanism: Secondary | ICD-10-CM

## 2025-01-03 DIAGNOSIS — Z806 Family history of leukemia: Secondary | ICD-10-CM | POA: Insufficient documentation

## 2025-01-03 DIAGNOSIS — Z860100 Personal history of colon polyps, unspecified: Secondary | ICD-10-CM | POA: Insufficient documentation

## 2025-01-03 DIAGNOSIS — Z90710 Acquired absence of both cervix and uterus: Secondary | ICD-10-CM

## 2025-01-03 DIAGNOSIS — C50511 Malignant neoplasm of lower-outer quadrant of right female breast: Secondary | ICD-10-CM

## 2025-01-03 DIAGNOSIS — Z833 Family history of diabetes mellitus: Secondary | ICD-10-CM

## 2025-01-03 DIAGNOSIS — Z7289 Other problems related to lifestyle: Secondary | ICD-10-CM | POA: Insufficient documentation

## 2025-01-03 DIAGNOSIS — Z8041 Family history of malignant neoplasm of ovary: Secondary | ICD-10-CM

## 2025-01-03 DIAGNOSIS — Z8249 Family history of ischemic heart disease and other diseases of the circulatory system: Secondary | ICD-10-CM | POA: Insufficient documentation

## 2025-01-03 DIAGNOSIS — Z86718 Personal history of other venous thrombosis and embolism: Secondary | ICD-10-CM | POA: Diagnosis not present

## 2025-01-03 DIAGNOSIS — Z880 Allergy status to penicillin: Secondary | ICD-10-CM

## 2025-01-03 DIAGNOSIS — Z87891 Personal history of nicotine dependence: Secondary | ICD-10-CM

## 2025-01-03 DIAGNOSIS — Z83719 Family history of colon polyps, unspecified: Secondary | ICD-10-CM | POA: Insufficient documentation

## 2025-01-03 DIAGNOSIS — Z809 Family history of malignant neoplasm, unspecified: Secondary | ICD-10-CM

## 2025-01-03 DIAGNOSIS — Z83438 Family history of other disorder of lipoprotein metabolism and other lipidemia: Secondary | ICD-10-CM | POA: Insufficient documentation

## 2025-01-03 DIAGNOSIS — Z818 Family history of other mental and behavioral disorders: Secondary | ICD-10-CM

## 2025-01-03 DIAGNOSIS — Z83511 Family history of glaucoma: Secondary | ICD-10-CM | POA: Insufficient documentation

## 2025-01-03 DIAGNOSIS — Z800 Family history of malignant neoplasm of digestive organs: Secondary | ICD-10-CM | POA: Insufficient documentation

## 2025-01-03 DIAGNOSIS — Z87442 Personal history of urinary calculi: Secondary | ICD-10-CM | POA: Insufficient documentation

## 2025-01-03 DIAGNOSIS — Z803 Family history of malignant neoplasm of breast: Secondary | ICD-10-CM

## 2025-01-03 DIAGNOSIS — Z825 Family history of asthma and other chronic lower respiratory diseases: Secondary | ICD-10-CM

## 2025-01-03 DIAGNOSIS — Z808 Family history of malignant neoplasm of other organs or systems: Secondary | ICD-10-CM

## 2025-01-03 DIAGNOSIS — Z8269 Family history of other diseases of the musculoskeletal system and connective tissue: Secondary | ICD-10-CM | POA: Insufficient documentation

## 2025-01-03 DIAGNOSIS — Z8261 Family history of arthritis: Secondary | ICD-10-CM | POA: Diagnosis not present

## 2025-01-03 DIAGNOSIS — Z1721 Progesterone receptor positive status: Secondary | ICD-10-CM | POA: Insufficient documentation

## 2025-01-03 DIAGNOSIS — Z7901 Long term (current) use of anticoagulants: Secondary | ICD-10-CM | POA: Diagnosis not present

## 2025-01-03 DIAGNOSIS — Z8042 Family history of malignant neoplasm of prostate: Secondary | ICD-10-CM

## 2025-01-03 LAB — CMP (CANCER CENTER ONLY)
ALT: 18 U/L (ref 0–44)
AST: 25 U/L (ref 15–41)
Albumin: 4.7 g/dL (ref 3.5–5.0)
Alkaline Phosphatase: 68 U/L (ref 38–126)
Anion gap: 13 (ref 5–15)
BUN: 17 mg/dL (ref 8–23)
CO2: 23 mmol/L (ref 22–32)
Calcium: 9.4 mg/dL (ref 8.9–10.3)
Chloride: 104 mmol/L (ref 98–111)
Creatinine: 0.61 mg/dL (ref 0.44–1.00)
GFR, Estimated: >60 mL/min
Glucose, Bld: 124 mg/dL — ABNORMAL HIGH (ref 70–99)
Potassium: 3.9 mmol/L (ref 3.5–5.1)
Sodium: 140 mmol/L (ref 135–145)
Total Bilirubin: 0.6 mg/dL (ref 0.0–1.2)
Total Protein: 7.5 g/dL (ref 6.5–8.1)

## 2025-01-03 LAB — CBC WITH DIFFERENTIAL (CANCER CENTER ONLY)
Abs Immature Granulocytes: 0.02 10*3/uL (ref 0.00–0.07)
Basophils Absolute: 0 10*3/uL (ref 0.0–0.1)
Basophils Relative: 1 %
Eosinophils Absolute: 0 10*3/uL (ref 0.0–0.5)
Eosinophils Relative: 1 %
HCT: 43 % (ref 36.0–46.0)
Hemoglobin: 15.1 g/dL — ABNORMAL HIGH (ref 12.0–15.0)
Immature Granulocytes: 0 %
Lymphocytes Relative: 24 %
Lymphs Abs: 1.5 10*3/uL (ref 0.7–4.0)
MCH: 31.4 pg (ref 26.0–34.0)
MCHC: 35.1 g/dL (ref 30.0–36.0)
MCV: 89.4 fL (ref 80.0–100.0)
Monocytes Absolute: 0.6 10*3/uL (ref 0.1–1.0)
Monocytes Relative: 9 %
Neutro Abs: 4.2 10*3/uL (ref 1.7–7.7)
Neutrophils Relative %: 65 %
Platelet Count: 209 10*3/uL (ref 150–400)
RBC: 4.81 MIL/uL (ref 3.87–5.11)
RDW: 12.3 % (ref 11.5–15.5)
WBC Count: 6.4 10*3/uL (ref 4.0–10.5)
nRBC: 0 % (ref 0.0–0.2)

## 2025-01-03 LAB — GENETIC SCREENING ORDER

## 2025-01-03 NOTE — Assessment & Plan Note (Signed)
 12/27/2024: History of left lumpectomy 1994; screening mammogram detected right breast distortion spanning 2 cm, stereotactic biopsy: Grade 2 invasive lobular cancer ER 100%, PR 100%, HER2 equivocal (FISH pending), Ki-67 10%  Pathology and radiology counseling:Discussed with the patient, the details of pathology including the type of breast cancer,the clinical staging, the significance of ER, PR and HER-2/neu receptors and the implications for treatment. After reviewing the pathology in detail, we proceeded to discuss the different treatment options between surgery, radiation, chemotherapy, antiestrogen therapies.  Recommendations: 1. Breast conserving surgery with sentinel lymph node biopsy followed by 2. Oncotype DX testing to determine if chemotherapy would be of any benefit followed by 3. Adjuvant radiation therapy followed by 4. Adjuvant antiestrogen therapy  Oncotype counseling: I discussed Oncotype DX test. I explained to the patient that this is a 21 gene panel to evaluate patient tumors DNA to calculate recurrence score. This would help determine whether patient has high risk or low risk breast cancer. She understands that if her tumor was found to be high risk, she would benefit from systemic chemotherapy. If low risk, no need of chemotherapy.  Return to clinic after surgery to discuss final pathology report and then determine if Oncotype DX testing will need to be sent.

## 2025-01-03 NOTE — Progress Notes (Signed)
 [facility] CONSULT NOTE  Patient Care Team: Copland, Harlene BROCKS, MD as PCP - General (Family Medicine) Tyree Nanetta SAILOR, RN as Oncology Nurse Navigator Curvin Deward MOULD, MD as Consulting Physician (General Surgery) Odean Potts, MD as Consulting Physician (Hematology and Oncology) Shannon Agent, MD as Consulting Physician (Radiation Oncology)  CHIEF COMPLAINTS/PURPOSE OF CONSULTATION:  Newly diagnosed breast cancer  HISTORY OF PRESENTING ILLNESS: Laurie Hernandez is a 69 year old with a history of a benign lumpectomy 1994 who presented with a screening mammogram detected distortion in the right breast measuring 2 cm.  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  She was presented this morning to the multidisciplinary tumor board and she is here today to discuss her treatment plan accompanied by her husband.  I reviewed her records extensively and collaborated the history with the patient.  SUMMARY OF ONCOLOGIC HISTORY: Oncology History  Malignant neoplasm of lower-outer quadrant of right breast of female, estrogen receptor positive (HCC)  12/27/2024 Initial Diagnosis   History of left lumpectomy 1994; screening mammogram detected right breast distortion spanning 2 cm, stereotactic biopsy: Grade 2 invasive lobular cancer ER 100%, PR 100%, HER2 equivocal (FISH negative), Ki-67 10%   01/03/2025 Cancer Staging   Staging form: Breast, AJCC 8th Edition - Clinical: Stage IA (cT1c, cN0, cM0, G2, ER+, PR+, HER2-) - Signed by Odean Potts, MD on 01/03/2025 Stage prefix: Initial diagnosis Histologic grading system: 3 grade system      MEDICAL HISTORY:  Past Medical History:  Diagnosis Date   Allergy    Penicillin   Metformin    Cataract    not a surgical candidate at this time (12/12/2021)   Colon polyps    Depression 2016   hx of-situational depression   Diabetes mellitus    on meds   DVT (deep venous thrombosis) (HCC)  1979   post Left Leg Fx   Headache(784.0)    migraines   Hepatitis    dx in 8th grade-not sure what type, can not donate blood.   History of chicken pox    childhood   History of kidney stones    no surgery required   Hyperlipidemia    on meds   Increased pressure in the eye    Rectal bleeding    SVD (spontaneous vaginal delivery)    x 1   UTI (lower urinary tract infection)     SURGICAL HISTORY: Past Surgical History:  Procedure Laterality Date   ABDOMINAL HYSTERECTOMY  2000   ANTERIOR AND POSTERIOR REPAIR  09/13/2012   Procedure: ANTERIOR (CYSTOCELE) AND POSTERIOR REPAIR (RECTOCELE);  Surgeon: Peggye Gull, MD;  Location: [facility];  Service: Gynecology;  Laterality: N/A;   BREAST BIOPSY Right 12/27/2024   MM RT BREAST BX W LOC DEV 1ST LESION IMAGE BX SPEC STEREO GUIDE 12/27/2024 [facility]   BREAST EXCISIONAL BIOPSY     BREAST SURGERY  1994   Left lumpectomy x 3 -  benigh   COLONOSCOPY  08/2012   @ [facility]-with Dr.Outlaw-MAC-good prep-TA   KNEE CARTILAGE SURGERY Left 2018   KNEE CARTILAGE SURGERY Left 2021   repair of meniscus repeat tear   TONSILLECTOMY     WISDOM TOOTH EXTRACTION      SOCIAL HISTORY: Social History   Socioeconomic History   Marital status: Married    Spouse name: Not on file   Number of children: Not on file   Years of education: Not on file   Highest education level: Bachelor's  degree (e.g., BA, AB, BS)  Occupational History   Not on file  Tobacco Use   Smoking status: Former    Current packs/day: 0.00    Average packs/day: 0.3 packs/day for 1 year (0.3 ttl pk-yrs)    Types: Cigarettes    Quit date: 05/26/1975    Years since quitting: 49.6   Smokeless tobacco: Never   Tobacco comments:    Early as young adult - maybe 1 year total  Vaping Use   Vaping status: Never Used  Substance and Sexual Activity   Alcohol use: Yes    Alcohol/week: 3.0 - 6.0 standard drinks of alcohol    Types: 1 Glasses of wine, 2 Cans of beer per week     Comment: Socially   Drug use: No   Sexual activity: Yes    Birth control/protection: None    Comment: Hysterectomy  Other Topics Concern   Not on file  Social History Narrative   Not on file   Social Drivers of Health   Tobacco Use: Medium Risk (01/03/2025)   Patient History    Smoking Tobacco Use: Former    Smokeless Tobacco Use: Never    Passive Exposure: Not on file  Financial Resource Strain: Low Risk (10/09/2024)   Overall Financial Resource Strain (CARDIA)    Difficulty of Paying Living Expenses: Not hard at all  Food Insecurity: No Food Insecurity (12/30/2024)   Epic    Worried About Radiation Protection Practitioner of Food in the Last Year: Never true    Ran Out of Food in the Last Year: Never true  Transportation Needs: No Transportation Needs (12/30/2024)   Epic    Lack of Transportation (Medical): No    Lack of Transportation (Non-Medical): No  Physical Activity: Sufficiently Active (10/10/2024)   Exercise Vital Sign    Days of Exercise per Week: 4 days    Minutes of Exercise per Session: 90 min  Stress: Patient Declined (10/10/2024)   [employer] of Occupational Health - Occupational Stress Questionnaire    Feeling of Stress: Patient declined  Social Connections: Unknown (10/10/2024)   Social Connection and Isolation Panel    Frequency of Communication with Friends and Family: Patient declined    Frequency of Social Gatherings with Friends and Family: Patient declined    Attends Religious Services: Patient declined    Active Member of Clubs or Organizations: Patient declined    Attends Banker Meetings: Never    Marital Status: Married  Catering Manager Violence: Not At Risk (10/10/2024)   Epic    Fear of Current or Ex-Partner: No    Emotionally Abused: No    Physically Abused: No    Sexually Abused: No  Depression (PHQ2-9): Low Risk (11/24/2024)   Depression (PHQ2-9)    PHQ-2 Score: 0  Alcohol Screen: Low Risk (10/09/2024)   Alcohol Screen    Last Alcohol  Screening Score (AUDIT): 2  Housing: Unknown (01/03/2025)   Received from [facility]   Epic    Unable to Pay for Housing in the Last Year: Not on file    Number of Times Moved in the Last Year: Not on file    At any time in the past 12 months, were you homeless or living in a shelter (including now)?: No  Recent Concern: Housing - High Risk (10/10/2024)   Epic    Unable to Pay for Housing in the Last Year: No    Number of Times Moved in the Last Year: 0  Homeless in the Last Year: Yes  Utilities: Not At Risk (12/30/2024)   Epic    Threatened with loss of utilities: No  Health Literacy: Adequate Health Literacy (10/10/2024)   B1300 Health Literacy    Frequency of need for help with medical instructions: Never    FAMILY HISTORY: Family History  Problem Relation Age of Onset   Hyperlipidemia Mother        Living   Glaucoma Mother    Sarcoidosis Mother    Colon polyps Father 55   Arthritis/Rheumatoid Father 46       Deceased   Colon cancer Father 28   Heart disease Father    Hypertension Father    Diabetes Father    Arthritis Father    Cancer Father    Thrombocytopenia Brother        #1   Hyperlipidemia Brother    Leukemia Brother        #1   Hyperlipidemia Brother        #2   Sleep apnea Brother        #2   Sudden death Maternal Uncle    Heart attack Maternal Uncle    Ovarian cancer Maternal Grandmother    Breast cancer Maternal Grandmother    Depression Son     ALLERGIES:  is allergic to penicillins and metformin  and related.  MEDICATIONS:  Current Outpatient Medications  Medication Sig Dispense Refill   apixaban  (ELIQUIS ) 5 MG TABS tablet Take 1 tablet (5 mg total) by mouth 2 (two) times daily. 180 tablet 3   B Complex-C (B-COMPLEX WITH VITAMIN C) tablet Take 1 tablet by mouth daily.     Biotin 89999 MCG TABS Take 10,000 mcg by mouth daily.     Calcium  250 MG CAPS Take 300 mg by mouth in the morning and at bedtime.     Cholecalciferol  (VITAMIN D3) 2000 units TABS Take 2,000 Units by mouth.     cycloSPORINE (RESTASIS) 0.05 % ophthalmic emulsion Place 1 drop into both eyes 2 (two) times daily.     glipiZIDE  (GLUCOTROL  XL) 5 MG 24 hr tablet Take 1 tablet (5 mg total) by mouth daily with breakfast. 90 tablet 3   JARDIANCE  25 MG TABS tablet TAKE 1 TABLET BY MOUTH DAILY  BEFORE BREAKFAST 100 tablet 2   MAGNESIUM GLYCINATE PO Take by mouth.     Multiple Vitamins-Minerals (CENTRUM SILVER 50+WOMEN) TABS Take 1 tablet by mouth daily.     rosuvastatin  (CRESTOR ) 20 MG tablet TAKE 1 TABLET BY MOUTH DAILY 100 tablet 2   SUMAtriptan  (IMITREX ) 100 MG tablet Take 1 tab by mouth daily as needed for migraine.  Max 200 mg in 24 hours 9 tablet 6   No current facility-administered medications for this visit.    REVIEW OF SYSTEMS:   Constitutional: Denies fevers, chills or abnormal night sweats Breast:  Denies any palpable lumps or discharge All other systems were reviewed with the patient and are negative.  PHYSICAL EXAMINATION: ECOG PERFORMANCE STATUS: 1 - Symptomatic but completely ambulatory  Vitals:   01/03/25 1244  BP: (!) 112/59  Pulse: 92  Resp: 18  Temp: 97.7 F (36.5 C)  SpO2: 95%   Filed Weights   01/03/25 1244  Weight: 164 lb 11.2 oz (74.7 kg)    GENERAL:alert, no distress and comfortable    LABORATORY DATA:  I have reviewed the data as listed Lab Results  Component Value Date   WBC 6.4 01/03/2025   HGB 15.1 (H) 01/03/2025  HCT 43.0 01/03/2025   MCV 89.4 01/03/2025   PLT 209 01/03/2025   Lab Results  Component Value Date   NA 140 01/03/2025   K 3.9 01/03/2025   CL 104 01/03/2025   CO2 23 01/03/2025    RADIOGRAPHIC STUDIES: I have personally reviewed the radiological reports and agreed with the findings in the report.  ASSESSMENT AND PLAN:  Malignant neoplasm of lower-outer quadrant of right breast of female, estrogen receptor positive (HCC) 12/27/2024: History of left lumpectomy 1994; screening  mammogram detected right breast distortion spanning 2 cm, stereotactic biopsy: Grade 2 invasive lobular cancer ER 100%, PR 100%, HER2 equivocal (FISH pending), Ki-67 10%  Pathology and radiology counseling:Discussed with the patient, the details of pathology including the type of breast cancer,the clinical staging, the significance of ER, PR and HER-2/neu receptors and the implications for treatment. After reviewing the pathology in detail, we proceeded to discuss the different treatment options between surgery, radiation, chemotherapy, antiestrogen therapies.  Recommendations: 1. Breast conserving surgery with sentinel lymph node biopsy followed by 2. Oncotype DX testing to determine if chemotherapy would be of any benefit followed by 3. Adjuvant radiation therapy followed by 4. Adjuvant antiestrogen therapy  Oncotype counseling: I discussed Oncotype DX test. I explained to the patient that this is a 21 gene panel to evaluate patient tumors DNA to calculate recurrence score. This would help determine whether patient has high risk or low risk breast cancer. She understands that if her tumor was found to be high risk, she would benefit from systemic chemotherapy. If low risk, no need of chemotherapy.  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  Return to clinic after surgery to discuss final pathology report and then determine if Oncotype DX testing will need to be sent.     All questions were answered. The patient knows to call the clinic with any problems, questions or concerns. I personally spent a total of 30 minutes in the care of the patient today including preparing to see the patient, getting/reviewing separately obtained history, performing a medically appropriate exam/evaluation, counseling and educating, placing orders, referring and communicating with other health care professionals, documenting clinical information in the EHR, independently interpreting  results, communicating results, and coordinating care.   Viinay K Sohana Austell, MD 01/03/25

## 2025-01-03 NOTE — Research (Unsigned)
 [employer] 2021-05 - Specimen Collection Study to Evaluate Biomarkers in Subjects with Cancer    Patient Laurie Hernandez was identified by Dr. Gudena as a potential candidate for the above listed study.  This Clinical Research Coordinator met with Laurie Hernandez, [id number], on 01/03/25 in a manner and location that ensures patient privacy to discuss participation in the above listed research study.  Patient is Accompanied by her husband, Norleen.  A copy of the informed consent document with embedded HIPAA language was provided to the patient.  Patient reads, speaks, and understands English.   Patient was provided with the business card of this Coordinator and encouraged to contact the research team with any questions.  Approximately 15 minutes were spent with the patient reviewing the informed consent documents.  Patient was provided the option of taking informed consent documents home to review and was encouraged to review at their convenience with their support network, including other care providers. Patient took the consent documents home to review.  Pt. Agreed for research team to have a follow-up call any time during the day next week.

## 2025-01-03 NOTE — Progress Notes (Unsigned)
 REFERRING PROVIDER: Curvin Deward MOULD, MD [address] [place],  [place] [address]  PRIMARY PROVIDER:  Watt Harlene BROCKS, MD  PRIMARY REASON FOR VISIT:  No diagnosis found.  HISTORY OF PRESENT ILLNESS:   Laurie Hernandez, a 69 y.o. female, was seen for a [facility] cancer genetics consultation at the request of Deward Curvin, MD due to a personal and family history of cancer.  [name] presents today the at [facility] to discuss the possibility of a hereditary predisposition to cancer, genetic testing, and to further clarify her future cancer risks, as well as potential cancer risks for family members.  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   CANCER HISTORY:  Oncology History  Malignant neoplasm of lower-outer quadrant of right breast of female, estrogen receptor positive (HCC)  12/27/2024 Initial Diagnosis   History of left lumpectomy 1994; screening mammogram detected right breast distortion spanning 2 cm, stereotactic biopsy: Grade 2 invasive lobular cancer ER 100%, PR 100%, HER2 equivocal (FISH negative), Ki-67 10%   01/03/2025 Cancer Staging   Staging form: Breast, AJCC 8th Edition - Clinical: Stage IA (cT1c, cN0, cM0, G2, ER+, PR+, HER2-) - Signed by Odean Potts, MD on 01/03/2025 Stage prefix: Initial diagnosis Histologic grading system: 3 grade system    RISK FACTORS:  Menarche was at age 72.  First live birth at age 89.  OCP use for approximately 1 years.  Hysterectomy: yes (2000) Menopausal status: postmenopausal.  Colonoscopy: yes (12/26/2021) Mammogram within the last year: yes. Number of breast biopsies: 2. Up to date with pelvic exams: n/a. Tobacco Use: Former  Past Medical History:  Diagnosis Date   Allergy    Penicillin   Metformin    Cataract    not a surgical candidate at this time (12/12/2021)    Colon polyps    Depression 2016   hx of-situational depression   Diabetes mellitus    on meds   DVT (deep venous thrombosis) (HCC) 1979   post Left Leg Fx   Headache(784.0)    migraines   Hepatitis    dx in 8th grade-not sure what type, can not donate blood.   History of chicken pox    childhood   History of kidney stones    no surgery required   Hyperlipidemia    on meds   Hypertension    Increased pressure in the eye    Rectal bleeding    SVD (spontaneous vaginal delivery)    x 1   UTI (lower urinary tract infection)    Past Surgical History:  Procedure Laterality Date   ABDOMINAL HYSTERECTOMY  2000   ANTERIOR AND POSTERIOR REPAIR  09/13/2012   Procedure: ANTERIOR (CYSTOCELE) AND POSTERIOR REPAIR (RECTOCELE);  Surgeon: Peggye Gull, MD;  Location: [facility];  Service: Gynecology;  Laterality: N/A;   BREAST BIOPSY Right 12/27/2024   MM RT BREAST BX W LOC DEV 1ST LESION IMAGE BX SPEC STEREO GUIDE 12/27/2024 [facility]   BREAST EXCISIONAL BIOPSY     BREAST SURGERY  1994   Left lumpectomy x 3 -  benigh   COLONOSCOPY  08/2012   @ [facility]-with Dr.Outlaw-MAC-good prep-TA   KNEE CARTILAGE SURGERY Left 2018   KNEE CARTILAGE SURGERY Left 2021   repair of meniscus repeat tear   TONSILLECTOMY     WISDOM TOOTH EXTRACTION     Social History   Socioeconomic  History   Marital status: Married    Spouse name: Not on file   Number of children: Not on file   Years of education: Not on file   Highest education level: Bachelor's degree (e.g., BA, AB, BS)  Occupational History   Not on file  Tobacco Use   Smoking status: Former    Current packs/day: 0.00    Average packs/day: 0.3 packs/day for 1 year (0.3 ttl pk-yrs)    Types: Cigarettes    Quit date: 05/26/1975    Years since quitting: 49.6   Smokeless tobacco: Never   Tobacco comments:    Early as young adult - maybe 1 year total  Vaping Use   Vaping status: Never Used  Substance and Sexual Activity   Alcohol use: Yes     Alcohol/week: 3.0 - 6.0 standard drinks of alcohol    Types: 1 Glasses of wine, 2 Cans of beer per week    Comment: Socially   Drug use: No   Sexual activity: Yes    Birth control/protection: None    Comment: Hysterectomy  Other Topics Concern   Not on file  Social History Narrative   Not on file   Social Drivers of Health   Tobacco Use: Medium Risk (01/03/2025)   Patient History    Smoking Tobacco Use: Former    Smokeless Tobacco Use: Never    Passive Exposure: Not on file  Financial Resource Strain: Low Risk (10/09/2024)   Overall Financial Resource Strain (CARDIA)    Difficulty of Paying Living Expenses: Not hard at all  Food Insecurity: No Food Insecurity (01/04/2025)   Epic    Worried About Radiation Protection Practitioner of Food in the Last Year: Never true    Ran Out of Food in the Last Year: Never true  Transportation Needs: No Transportation Needs (01/04/2025)   Epic    Lack of Transportation (Medical): No    Lack of Transportation (Non-Medical): No  Physical Activity: Sufficiently Active (10/10/2024)   Exercise Vital Sign    Days of Exercise per Week: 4 days    Minutes of Exercise per Session: 90 min  Stress: Patient Declined (10/10/2024)   [employer] of Occupational Health - Occupational Stress Questionnaire    Feeling of Stress: Patient declined  Social Connections: Unknown (10/10/2024)   Social Connection and Isolation Panel    Frequency of Communication with Friends and Family: Patient declined    Frequency of Social Gatherings with Friends and Family: Patient declined    Attends Religious Services: Patient declined    Active Member of Clubs or Organizations: Patient declined    Attends Banker Meetings: Never    Marital Status: Married  Depression (PHQ2-9): Low Risk (01/04/2025)   Depression (PHQ2-9)    PHQ-2 Score: 1  Alcohol Screen: Low Risk (10/09/2024)   Alcohol Screen    Last Alcohol Screening Score (AUDIT): 2  Housing: Low Risk (01/04/2025)   Epic     Unable to Pay for Housing in the Last Year: No    Number of Times Moved in the Last Year: 0    Homeless in the Last Year: No  Recent Concern: Housing - High Risk (10/10/2024)   Epic    Unable to Pay for Housing in the Last Year: No    Number of Times Moved in the Last Year: 0    Homeless in the Last Year: Yes  Utilities: Not At Risk (01/04/2025)   Epic    Threatened with loss of utilities: No  Health Literacy: Adequate Health Literacy (10/10/2024)   B1300 Health Literacy    Frequency of need for help with medical instructions: Never    FAMILY HISTORY:  We obtained a detailed, 4-generation family history pasted below.   Vegas Coffin is unaware of relatives completing genetic testing for hereditary cancer risks.    Pedigree Summary Brother - Chronic Lymphocytic Leukemia, Prostate Cancer dx. 60's, Immune thrombocytopenic purpura (ITP) dx. 40 Maternal aunt - Melanoma Maternal Grandmother - Breast and Ovarian Cancer Father - Colon Cancer dx. 84   Significant diagnoses are listed below: Family History  Problem Relation Age of Onset   Hyperlipidemia Mother        Living   Glaucoma Mother    Sarcoidosis Mother    Colon polyps Father 91   Arthritis/Rheumatoid Father 21       Deceased   Colon cancer Father 34   Heart disease Father    Hypertension Father    Diabetes Father    Arthritis Father    Cancer Father    Thrombocytopenia Brother        #1   Hyperlipidemia Brother    Leukemia Brother    Prostate cancer Brother    Hyperlipidemia Brother        #2   Ovarian cancer Maternal Grandmother    Breast cancer Maternal Grandmother    Depression Son    Sudden death Maternal Uncle    Heart attack Maternal Uncle    Melanoma Maternal Aunt     GENETIC COUNSELING ASSESSMENT: Andrienne Havener is a 69 y.o. female with a personal and family history of cancer which is somewhat suggestive of a hereditary cancer predisposition syndrome. We, therefore, discussed and recommended the  following at today's visit.   DISCUSSION: We discussed that, in general, most cancer is not inherited in families, but instead is sporadic or familial. Sporadic cancers occur by chance and typically happen at older ages (>50 years) as this type of cancer is caused by genetic changes acquired during an individuals lifetime. Some families have more cancers than would be expected by chance; however, the ages or types of cancer are not consistent with a known genetic mutation or known genetic mutations have been ruled out. This type of familial cancer is thought to be due to a combination of multiple genetic, environmental, hormonal, and lifestyle factors. While this combination of factors likely increases the risk of cancer, the exact source of this risk is not currently identifiable or testable.  We discussed that 5-10% of cancer is the result of germline (heritable) genetic variants, with most cases associated with BRCA1/BRCA2. There are other genes that can be associated with hereditary cancer syndromes. These include ATM, BARD1, CDH1, CHEK2, NF1, PALB2, PTEN, RAD51C, RAD51D, STK11 and TP53. We discussed that testing is beneficial for several reasons including knowing how to follow individuals after completing their treatment, identifying whether potential treatment options such as PARP inhibitors would be beneficial, and understanding if other family members could be at risk for cancer and allow them to undergo genetic testing.   We reviewed the characteristics, features and inheritance patterns of hereditary cancer syndromes. We also discussed genetic testing, including the appropriate family members to test, the process of testing, insurance coverage and turn-around-time for results. We discussed the implications of a negative, positive, carrier and/or variant of uncertain significant result. Mathilde Mcwherter  was offered a common hereditary cancer panel (40 genes) and an expanded pan-cancer panel (77  genes). Synthia Fairbank was informed of the benefits  and limitations of each panel, including that expanded pan-cancer panels contain genes that do not have clear management guidelines at this point in time.  We also discussed that as the number of genes included on a panel increases, the chances of variants of uncertain significance increases.  GENETIC TESTING NATIONAL CRITERIA: Based on [employer] personal and family history of breast or prostate cancer in three people on the same side of the family and having a relative with ovarian cancer she meets medical criteria for genetic testing based on the [employer] (NCCN) guidelines.   GENETIC TESTING CONSENT:  After considering the risks, benefits, and limitations, Noely Kuhnle provided consent to pursue genetic testing. A blood sample was sent to [facility] for analysis of the BRCAplus and CancerNext+RNA Panel. Results should be available within approximately 2-3 weeks' time, at which point they will be disclosed by telephone to Comer Schlatter , as will any additional recommendations warranted by these results. Tylea Hise will receive a summary of her genetic counseling visit and a copy of her results once available. This information will also be available in Epic.  BRCAplus: ATM, BARD1, BRCA1, BRCA2, CDH1, CHEK2, NF1, PALB2, PTEN, RAD51C, RAD51D, STK11 and TP53 (sequencing and deletion/duplication)  [facility] CancerNext + RNAinsight gene panel which includes sequencing, rearrangement analysis, and RNA analysis for the following 40 genes: APC, ATM, BAP1, BARD1, BMPR1A, BRCA1, BRCA2, BRIP1, CDH1, CDKN2A, CHEK2, FH, FLCN, MET, MLH1, MSH2, MSH6, MUTYH, NF1, NTHL1, PALB2, PMS2, PTEN, RAD51C, RAD51D, RPS20, SMAD4, STK11, TP53, TSC1, TSC2, and VHL (sequencing and deletion/duplication); AXIN2, HOXB13, MBD4, MSH3, POLD1 and POLE (sequencing only); EPCAM and GREM1 (deletion/duplication only).  GENETIC INFORMATION  NONDISCRIMINATION ACT (GINA): We discussed that some people do not want to undergo genetic testing due to fear of genetic discrimination.  The Genetic Information Nondiscrimination Act (GINA) was signed into federal law in 2008. GINA prohibits health insurers and most employers from discriminating against individuals based on genetic information (including the results of genetic tests and family history information). According to GINA, health insurance companies cannot consider genetic information to be a preexisting condition, nor can they use it to make decisions regarding coverage or rates. GINA also makes it illegal for most employers to use genetic information in making decisions about hiring, firing, promotion, or terms of employment. It is important to note that GINA does not offer protections for life insurance, disability insurance, or long-term care insurance. GINA does not apply to those in the [employer], those who work for companies with less than 15 employees, and new life insurance or long-term disability insurance policies.  Health status due to a cancer diagnosis is not protected under GINA. More information about GINA can be found by visiting eliteclients.be. Lastly, we encouraged Akshita Italiano to remain in contact with cancer genetics annually so that we can continuously update the family history and inform her of any changes in cancer genetics and testing that may be of benefit for this family.   Rona Tomson questions were answered to her satisfaction today. Our contact information was provided should additional questions or concerns arise. Thank you for the referral and allowing us  to share in the care of your patient.   Resources:  Margueritte Guthridge was provided with the following:  [facility] Billing information  [facility] Hereditary Cancer Testing Patient Guide [facility] CancerNext+ RNAinsight gene list  PLAN:  Testing Ordered: [facility] CancerNext+ RNAinsight and  BRCAplus panels  Sly Parlee Lindsey-Mills, MS, CGC  Certified Genetic Counselor  Email: [email or web address]  Phone: [phone]  I personally spent a total of 20 minutes in the care of the patient today including preparing to see the patient, counseling and educating, placing orders, and documenting clinical information in the EHR. The patient was joined by her husband. Drs. Lanny Stalls, and/or Gudena were available for questions, if needed. _______________________________________________________________________ For Office Staff:  Number of people involved in session: 2 Was an Intern/ student involved with case: no

## 2025-01-03 NOTE — Therapy (Signed)
 " OUTPATIENT PHYSICAL THERAPY BREAST CANCER BASELINE EVALUATION   Patient Name: Laurie Hernandez MRN: [id number] DOB:February 11, 1956, 69 y.o., female Today's Date: 01/03/2025  END OF SESSION:  PT End of Session - 01/03/25 1306     Visit Number 1    Number of Visits 2    Date for Recertification  02/28/25    PT Start Time 1332    PT Stop Time 1343   Also saw pt from [phone] for a total of 34 min   PT Time Calculation (min) 11 min    Activity Tolerance Patient tolerated treatment well    Behavior During Therapy [facility] for tasks assessed/performed          Past Medical History:  Diagnosis Date   Allergy    Penicillin   Metformin    Cataract    not a surgical candidate at this time (12/12/2021)   Colon polyps    Depression 2016   hx of-situational depression   Diabetes mellitus    on meds   DVT (deep venous thrombosis) (HCC) 1979   post Left Leg Fx   Headache(784.0)    migraines   Hepatitis    dx in 8th grade-not sure what type, can not donate blood.   History of chicken pox    childhood   History of kidney stones    no surgery required   Hyperlipidemia    on meds   Hypertension    Increased pressure in the eye    Rectal bleeding    SVD (spontaneous vaginal delivery)    x 1   UTI (lower urinary tract infection)    Past Surgical History:  Procedure Laterality Date   ABDOMINAL HYSTERECTOMY  2000   ANTERIOR AND POSTERIOR REPAIR  09/13/2012   Procedure: ANTERIOR (CYSTOCELE) AND POSTERIOR REPAIR (RECTOCELE);  Surgeon: Peggye Gull, MD;  Location: [facility];  Service: Gynecology;  Laterality: N/A;   BREAST BIOPSY Right 12/27/2024   MM RT BREAST BX W LOC DEV 1ST LESION IMAGE BX SPEC STEREO GUIDE 12/27/2024 [facility]   BREAST EXCISIONAL BIOPSY     BREAST SURGERY  1994   Left lumpectomy x 3 -  benigh   COLONOSCOPY  08/2012   @ [facility]-with Dr.Outlaw-MAC-good prep-TA   KNEE CARTILAGE SURGERY Left 2018   KNEE CARTILAGE SURGERY Left 2021   repair of meniscus repeat tear    TONSILLECTOMY     WISDOM TOOTH EXTRACTION     Patient Active Problem List   Diagnosis Date Noted   Malignant neoplasm of lower-outer quadrant of right breast of female, estrogen receptor positive (HCC) 12/29/2024   Osteopenia 10/14/2021   History of vitamin D  deficiency 06/27/2016   History of kidney stones 11/30/2015   Migraine without aura and without status migrainosus, not intractable 01/20/2015   Type 2 diabetes mellitus without complication 01/20/2015    REFERRING PROVIDER: Dr. Deward Null  REFERRING DIAG: Right breast cancer  THERAPY DIAG:  Malignant neoplasm of lower-outer quadrant of right breast of female, estrogen receptor positive (HCC)  Abnormal posture  Rationale for Evaluation and Treatment: Rehabilitation  ONSET DATE: 11/20/2024  SUBJECTIVE:  SUBJECTIVE STATEMENT: Patient reports she is here today to be seen by her medical team for her newly diagnosed right breast cancer.   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 It measures 2 cm and is located in the lower outer quadrant. It is ER/PR positive and HER2 negative with a Ki67 of 10%. She was diagnosed with a DVT in her left lower extremity 11/20/2024 and is on Eliquis .  PATIENT GOALS:   reduce lymphedema risk and learn post op HEP.   PAIN:  Are you having pain? Has chronic thumb pain but not currently bothering her  PRECAUTIONS: Active CA   RED FLAGS: None   HAND DOMINANCE: right  WEIGHT BEARING RESTRICTIONS: No  FALLS:  Has patient fallen in last 6 months? 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  COGNITION: Overall cognitive status: Within functional limits for tasks assessed    POSTURE:  Forward head and rounded shoulders posture  UPPER EXTREMITY AROM/PROM:  A/PROM RIGHT   eval   Shoulder extension 44  Shoulder flexion 157  Shoulder abduction 172  Shoulder internal rotation 58  Shoulder external rotation 88    (Blank rows = not tested)  A/PROM LEFT   eval  Shoulder extension 54  Shoulder flexion 151  Shoulder abduction 158  Shoulder internal rotation 52  Shoulder external rotation 90    (Blank rows = not tested)  CERVICAL AROM: All within normal limits  UPPER EXTREMITY STRENGTH: WNL  LYMPHEDEMA ASSESSMENTS (in cm):   LANDMARK RIGHT   eval  10 cm proximal to olecranon process from proximal aspect of olecranon 28.1  Olecranon process 23.7  10 cm proximal to ulnar styloid process from proximal aspect of styloid process 22.2  Just distal to ulnar styloid process 15.1  Across hand at thumb web space 18  At base of 2nd digit 6.3  (Blank rows = not tested)  LANDMARK LEFT   eval  10 cm proximal to olecranon process from proximal aspect of olecranon 26.7   Olecranon process 23.3  10 cm proximal to ulnar styloid process from proximal aspect of styloid process 20.5  Just distal to ulnar styloid process 14.6  Across hand at thumb web space 18  At base of 2nd digit 6.2  (Blank rows = not tested)  L-DEX LYMPHEDEMA SCREENING:  The patient was assessed using the L-Dex machine today to produce a lymphedema index baseline score. The patient will be reassessed on a regular basis (typically every 3 months) to obtain new L-Dex scores. If the score is > 6.5 points away from his/her baseline score indicating onset of subclinical lymphedema, it will be recommended to wear a compression garment for 4 weeks,  12 hours per day and then be reassessed. If the score continues to be > 6.5 points from baseline at reassessment, we will initiate lymphedema treatment. Assessing in this manner has a 95% rate of preventing clinically significant lymphedema.   L-DEX FLOWSHEETS - 01/03/25 1300       L-DEX LYMPHEDEMA SCREENING   Measurement Type Unilateral    L-DEX MEASUREMENT EXTREMITY Upper Extremity    POSITION  Standing    DOMINANT SIDE Right  At Risk Side Right          QUICK DASH SURVEY:  Junie Palin - 01/03/25 0001     Open a tight or new jar Mild difficulty    Do heavy household chores (wash walls, wash floors) No difficulty    Carry a shopping bag or briefcase No difficulty    Wash your back No difficulty    Use a knife to cut food No difficulty    Recreational activities in which you take some force or impact through your arm, shoulder, or hand (golf, hammering, tennis) No difficulty    During the past week, to what extent has your arm, shoulder or hand problem interfered with your normal social activities with family, friends, neighbors, or groups? Not at all    During the past week, to what extent has your arm, shoulder or hand problem limited your work or other regular daily activities Not at all    Arm, shoulder, or hand pain. None    Tingling (pins and needles) in your arm, shoulder, or hand Mild    Difficulty Sleeping No difficulty    DASH Score 4.55 %           PATIENT EDUCATION:  Education details: Time spent educating patient on aspects of self-care to maximize post op recovery. Patient was educated on where and how to get a post op compression bra to use to reduce post op edema. Patient was also educated on the use of SOZO screenings and surveillance principles for early identification of lymphedema onset. She was instructed to use the post op pillow in the axilla for pressure and pain relief. Patient educated on lymphedema risk reduction and post op shoulder/posture  HEP. Person educated: Patient Education method: Explanation, Demonstration, Handout Education comprehension: Patient verbalized understanding and returned demonstration  HOME EXERCISE PROGRAM: Patient was instructed today in a home exercise program today for post op shoulder range of motion. These included active assist shoulder flexion in sitting, scapular retraction, wall walking with shoulder abduction, and hands behind head external rotation.  She was encouraged to do these twice a day, holding 3 seconds and repeating 5 times when permitted by her physician.   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 It measures 2 cm and is located in the lower outer quadrant. It is ER/PR positive and HER2 negative with a Ki67 of 10%. She was diagnosed with a DVT in her left lower extremity 11/20/2024 and is on Eliquis .Her multidisciplinary medical team met prior to her assessments to determine a recommended treatment plan. She is planning to have a right lumpectomy and sentinel node biopsy followed by Oncotype testing, radiation, and anti-estrogen therapy. She will benefit from a post op PT reassessment to determine needs and from L-Dex screens every 3 months for 2 years to detect subclinical lymphedema.  Pt will benefit from skilled therapeutic intervention to improve on the following deficits: Decreased knowledge of precautions, impaired UE functional use, pain, decreased ROM, postural dysfunction.   PT treatment/interventions: ADL/self-care home management, pt/family education, therapeutic exercise  REHAB POTENTIAL: Excellent  CLINICAL DECISION MAKING: Stable/uncomplicated  EVALUATION COMPLEXITY: Low   GOALS: Goals reviewed with patient? YES  LONG TERM GOALS: (STG=LTG)    Name Target Date Goal status  1 Pt will be able to verbalize understanding of pertinent lymphedema risk reduction practices relevant to  her dx specifically related to skin care.  Baseline:  No knowledge 01/03/2025 Achieved at  eval  2 Pt will be able to return demo and/or verbalize understanding of the post op HEP related to regaining shoulder ROM. Baseline:  No knowledge 01/03/2025 Achieved at eval  3 Pt will be able to verbalize understanding of the importance of viewing the post op After Breast CA Class video for further lymphedema risk reduction education and therapeutic exercise.  Baseline:  No knowledge 01/03/2025 Achieved at eval  4 Pt will demo she has regained full shoulder ROM and function post operatively compared to baselines.  Baseline: See objective measurements taken today. 02/29/2024     PLAN:  PT FREQUENCY/DURATION: EVAL and 1 follow up appointment.   PLAN FOR NEXT SESSION: will reassess 3-4 weeks post op to determine needs.   Patient will follow up at outpatient cancer rehab 3-4 weeks following surgery.  If the patient requires physical therapy at that time, a specific plan will be dictated and sent to the referring physician for approval. The patient was educated today on appropriate basic range of motion exercises to begin post operatively and the importance of viewing the After Breast Cancer class video following surgery.  Patient was educated today on lymphedema risk reduction practices as it pertains to recommendations that will benefit the patient immediately following surgery.  She verbalized good understanding.    Physical Therapy Information for After Breast Cancer Surgery/Treatment:  Lymphedema is a swelling condition that you may be at risk for in your arm if you have lymph nodes removed from the armpit area.  After a sentinel node biopsy, the risk is approximately 5-9% and is higher after an axillary node dissection.  There is treatment available for this condition and it is not life-threatening.  Contact your physician or physical therapist with concerns. You may begin the 4 shoulder/posture exercises  (see additional sheet) when permitted by your physician (typically a week after surgery).  If you have drains, you may need to wait until those are removed before beginning range of motion exercises.  A general recommendation is to not lift your arms above shoulder height until drains are removed.  These exercises should be done to your tolerance and gently.  This is not a no pain/no gain type of recovery so listen to your body and stretch into the range of motion that you can tolerate, stopping if you have pain.  If you are having immediate reconstruction, ask your plastic surgeon about doing exercises as he or she may want you to wait. We encourage you to view the After Breast Cancer class video following surgery.  You will learn information related to lymphedema risk, prevention and treatment and additional exercises to regain mobility following surgery.   While undergoing any medical procedure or treatment, try to avoid blood pressure being taken or needle sticks from occurring on the arm on the side of cancer.   This recommendation begins after surgery and continues for the rest of your life.  This may help reduce your risk of getting lymphedema (swelling in your arm). An excellent resource for those seeking information on lymphedema is the National Lymphedema Network's web site. It can be accessed at www.lymphnet.org If you notice swelling in your hand, arm or breast at any time following surgery (even if it is many years from now), please contact your doctor or physical therapist to discuss this.  Lymphedema can be treated at any time but it is easier for you if it is treated early on.  If you feel like your shoulder motion is not returning  to normal in a reasonable amount of time, please contact your surgeon or physical therapist.  [facility] [phone]. [address], [place] [address]  [facility] CLASS After Breast Cancer Class  After Breast Cancer  Class is a specially designed exercise class video to assist you in a safe recover after having breast cancer surgery.  In this video you will learn how to get back to full function whether your drains were just removed or if you had surgery a month ago. The video can be viewed on this page: [email or web address]-[email or web address] or on YouTube here: https://youtu.[email or web address].  Class Goals  Understand specific stretches to improve the flexibility of you chest and shoulder. Learn ways to safely strengthen your upper body and improve your posture. Understand the warning signs of infection and why you may be at risk for an arm infection. Learn about Lymphedema and prevention.  ** You do not need to view this video until after surgery.  Drains should be removed to participate in the recommended exercises on the video.  Patient was instructed today in a home exercise program today for post op shoulder range of motion. These included active assist shoulder flexion in sitting, scapular retraction, wall walking with shoulder abduction, and hands behind head external rotation.  She was encouraged to do these twice a day, holding 3 seconds and repeating 5 times when permitted by her physician.  Eward Wonda Sharps, [place] 01/03/25 2:53 PM   "

## 2025-01-04 ENCOUNTER — Telehealth: Payer: Self-pay | Admitting: Radiation Oncology

## 2025-01-04 DIAGNOSIS — Z808 Family history of malignant neoplasm of other organs or systems: Secondary | ICD-10-CM | POA: Insufficient documentation

## 2025-01-04 DIAGNOSIS — Z806 Family history of leukemia: Secondary | ICD-10-CM | POA: Insufficient documentation

## 2025-01-04 DIAGNOSIS — Z803 Family history of malignant neoplasm of breast: Secondary | ICD-10-CM | POA: Insufficient documentation

## 2025-01-04 DIAGNOSIS — Z8042 Family history of malignant neoplasm of prostate: Secondary | ICD-10-CM | POA: Insufficient documentation

## 2025-01-04 DIAGNOSIS — Z800 Family history of malignant neoplasm of digestive organs: Secondary | ICD-10-CM | POA: Insufficient documentation

## 2025-01-04 DIAGNOSIS — Z8041 Family history of malignant neoplasm of ovary: Secondary | ICD-10-CM | POA: Insufficient documentation

## 2025-01-04 NOTE — Telephone Encounter (Signed)
 1/29 Received call back from [facility] -Dr. Eleanore office.  Referral closed was an error.  Referral now reopen.  Waiting on Sx date before reach out to patient to be sch for consult.

## 2025-01-04 NOTE — Progress Notes (Signed)
 [facility] Spiritual Care Note  Met with Laurie Hernandez and her husband in [facility] Multidisciplinary Clinic to introduce Support Center team/resources.  Laurie Ege completed SDOH screening; results follow below.  SDOH Interventions    Flowsheet Row Clinical Support from 10/10/2024 in [facility] at [facility] Clinical Support from 10/05/2023 in [facility] at [facility] Clinical Support from 09/16/2022 in [facility] at [facility]  SDOH Interventions     Food Insecurity Interventions Intervention Not Indicated Intervention Not Indicated Intervention Not Indicated  Housing Interventions Intervention Not Indicated Intervention Not Indicated Intervention Not Indicated  Transportation Interventions Intervention Not Indicated Intervention Not Indicated Intervention Not Indicated  Utilities Interventions Intervention Not Indicated Intervention Not Indicated Intervention Not Indicated  Alcohol Usage Interventions -- Intervention Not Indicated (Score <7) Intervention Not Indicated (Score <7)  Financial Strain Interventions -- Intervention Not Indicated Intervention Not Indicated  Physical Activity Interventions Intervention Not Indicated Intervention Not Indicated Intervention Not Indicated  Stress Interventions Intervention Not Indicated Intervention Not Indicated Intervention Not Indicated  Social Connections Interventions Intervention Not Indicated Intervention Not Indicated Intervention Not Indicated  Health Literacy Interventions Intervention Not Indicated Intervention Not Indicated --    SDOH Screenings   Food Insecurity: No Food Insecurity (01/04/2025)  Housing: Low Risk (01/04/2025)  Recent Concern: Housing - High Risk (10/10/2024)  Transportation Needs: No Transportation Needs (01/04/2025)  Utilities: Not At Risk (01/04/2025)  Alcohol Screen: Low Risk (10/09/2024)  Depression (PHQ2-9):  Low Risk (01/04/2025)  Financial Resource Strain: Low Risk (10/09/2024)  Physical Activity: Sufficiently Active (10/10/2024)  Social Connections: Unknown (10/10/2024)  Stress: Patient Declined (10/10/2024)  Tobacco Use: Medium Risk (01/03/2025)  Health Literacy: Adequate Health Literacy (10/10/2024)   Chaplain and patient discussed common feelings and emotions when being diagnosed with cancer, and the importance of support during treatment.  Chaplain informed patient of the support team and support services at [facility].  Chaplain provided contact information and encouraged patient to call with any questions or concerns. Laurie Hernandez notes that she has significant transferable coping tools, which she anticipates will serve well through her adjustment and treatment experience.  Follow up needed: Yes.   We plan to follow up by phone in ca two weeks for Spiritual Care check-in.   [address] [place], [place], [facility] Pager [phone] Voicemail [phone]

## 2025-01-04 NOTE — Telephone Encounter (Unsigned)
 Copied from CRM [id number]. Topic: General - Call Back - No Documentation >> Jan 04, 2025  9:07 AM Rea BROCKS wrote: Reason for CRM: Received referral from Dr. Watt for patient to be scheduled but they are following up because they are seeing that the referral was closed. They are calling to know why the referral was closed so they can update on their end as well.    Callback [phone], ext [phone]

## 2025-01-04 NOTE — Telephone Encounter (Signed)
 1/29 Patient referral is now closed, reach out to Dr. Eleanore office to confirm if patient do not need to be seen in [facility].  Waiting on call back.

## 2025-01-05 ENCOUNTER — Ambulatory Visit
Admission: RE | Admit: 2025-01-05 | Discharge: 2025-01-05 | Disposition: A | Source: Ambulatory Visit | Attending: Hematology and Oncology

## 2025-01-05 ENCOUNTER — Other Ambulatory Visit: Payer: Self-pay | Admitting: Hematology and Oncology

## 2025-01-05 DIAGNOSIS — Z170 Estrogen receptor positive status [ER+]: Secondary | ICD-10-CM

## 2025-01-05 MED ORDER — IOPAMIDOL (ISOVUE-370) INJECTION 76%
100.0000 mL | Freq: Once | INTRAVENOUS | Status: AC | PRN
  Administered 2025-01-05: 100 mL via INTRAVENOUS

## 2025-01-06 ENCOUNTER — Encounter: Payer: Self-pay | Admitting: Physician Assistant

## 2025-01-06 ENCOUNTER — Telehealth: Admitting: Physician Assistant

## 2025-01-06 ENCOUNTER — Encounter: Payer: Self-pay | Admitting: Hematology and Oncology

## 2025-01-06 DIAGNOSIS — T508X5A Adverse effect of diagnostic agents, initial encounter: Secondary | ICD-10-CM | POA: Diagnosis not present

## 2025-01-06 DIAGNOSIS — R600 Localized edema: Secondary | ICD-10-CM | POA: Diagnosis not present

## 2025-01-06 MED ORDER — PREDNISONE 10 MG PO TABS: ORAL_TABLET | ORAL | 0 refills | Status: AC

## 2025-01-06 NOTE — Patient Instructions (Addendum)
 " Laurie Hernandez, thank you for joining Laurie Feinberg, PA-C for today's virtual visit.  While this provider is not your primary care provider (PCP), if your PCP is located in our provider database this encounter information will be shared with them immediately following your visit.   A [facility] MyChart account gives you access to today's visit and all your visits, tests, and labs performed at [facility]  click here if you don't have a [facility] MyChart account or go to [email or web address]  Consent: (Patient) Laurie Hernandez provided verbal consent for this virtual visit at the beginning of the encounter.  Current Medications:  Current Outpatient Medications:    predniSONE  (DELTASONE ) 10 MG tablet, Take 4 tablets (40 mg total) by mouth daily with breakfast for 3 days, THEN 2 tablets (20 mg total) daily with breakfast for 3 days, THEN 1 tablet (10 mg total) daily with breakfast for 3 days., Disp: 21 tablet, Rfl: 0   apixaban  (ELIQUIS ) 5 MG TABS tablet, Take 1 tablet (5 mg total) by mouth 2 (two) times daily., Disp: 180 tablet, Rfl: 3   B Complex-C (B-COMPLEX WITH VITAMIN C) tablet, Take 1 tablet by mouth daily., Disp: , Rfl:    Biotin 89999 MCG TABS, Take 10,000 mcg by mouth daily., Disp: , Rfl:    Calcium  250 MG CAPS, Take 300 mg by mouth in the morning and at bedtime., Disp: , Rfl:    Cholecalciferol (VITAMIN D3) 2000 units TABS, Take 2,000 Units by mouth., Disp: , Rfl:    cycloSPORINE (RESTASIS) 0.05 % ophthalmic emulsion, Place 1 drop into both eyes 2 (two) times daily., Disp: , Rfl:    glipiZIDE  (GLUCOTROL  XL) 5 MG 24 hr tablet, Take 1 tablet (5 mg total) by mouth daily with breakfast., Disp: 90 tablet, Rfl: 3   JARDIANCE  25 MG TABS tablet, TAKE 1 TABLET BY MOUTH DAILY  BEFORE BREAKFAST, Disp: 100 tablet, Rfl: 2   MAGNESIUM GLYCINATE PO, Take by mouth., Disp: , Rfl:    Multiple Vitamins-Minerals (CENTRUM SILVER 50+WOMEN) TABS, Take 1 tablet by mouth daily., Disp:  , Rfl:    rosuvastatin  (CRESTOR ) 20 MG tablet, TAKE 1 TABLET BY MOUTH DAILY, Disp: 100 tablet, Rfl: 2   SUMAtriptan  (IMITREX ) 100 MG tablet, Take 1 tab by mouth daily as needed for migraine.  Max 200 mg in 24 hours, Disp: 9 tablet, Rfl: 6   Medications ordered in this encounter:  Meds ordered this encounter  Medications   predniSONE  (DELTASONE ) 10 MG tablet    Sig: Take 4 tablets (40 mg total) by mouth daily with breakfast for 3 days, THEN 2 tablets (20 mg total) daily with breakfast for 3 days, THEN 1 tablet (10 mg total) daily with breakfast for 3 days.    Dispense:  21 tablet    Refill:  0    Supervising Provider:   BLAISE ALEENE KIDD [[id number]]     *If you need refills on other medications prior to your next appointment, please contact your pharmacy*  Follow-Up: Call back or seek an in-person evaluation if the symptoms worsen or if the condition fails to improve as anticipated.   Other Instructions Patient advised to continue taking Benadryl  every 6-8 hours as needed Prescription for prednisone  sent to the pharmacy Patient will continue to monitor her blood sugar at home Has taken steroids in the past without significant side effects Strict ER precautions discussed such as any new or worsening symptoms including worsening facial swelling, swelling of lips, tongue, chest  pain, shortness of breath she is to call EMS.   Patient will inform her oncology doctors about this possible allergic reaction   If you have been instructed to have an in-person evaluation today at a local Urgent Care facility, please use the link below. It will take you to a list of all of our available [facility], including address, phone number and hours of operation. Please do not delay care.  [facility]  If you or a family member do not have a primary care provider, use the link below to schedule a visit and establish care. When you choose a [facility] primary care physician or  advanced practice provider, you gain a long-term partner in health. Find a Primary Care Provider  Learn more about [facility]'s in-office and virtual care options: [facility] - Get Care Now  "

## 2025-01-06 NOTE — Progress Notes (Signed)
 " Virtual Visit Consent   Laurie Hernandez, you are scheduled for a virtual visit with a [facility] provider today. Just as with appointments in the office, your consent must be obtained to participate. Your consent will be active for this visit and any virtual visit you may have with one of our providers in the next 365 days. If you have a MyChart account, a copy of this consent can be sent to you electronically.  As this is a virtual visit, video technology does not allow for your provider to perform a traditional examination. This may limit your provider's ability to fully assess your condition. If your provider identifies any concerns that need to be evaluated in person or the need to arrange testing (such as labs, EKG, etc.), we will make arrangements to do so. Although advances in technology are sophisticated, we cannot ensure that it will always work on either your end or our end. If the connection with a video visit is poor, the visit may have to be switched to a telephone visit. With either a video or telephone visit, we are not always able to ensure that we have a secure connection.  By engaging in this virtual visit, you consent to the provision of healthcare and authorize for your insurance to be billed (if applicable) for the services provided during this visit. Depending on your insurance coverage, you may receive a charge related to this service.  I need to obtain your verbal consent now. Are you willing to proceed with your visit today? Laurie Hernandez has provided verbal consent on 01/06/2025 for a virtual visit (video or telephone). Laurie Helfrich, PA-C  Date: 01/06/2025 12:16 PM   Virtual Visit via Video Note   I, Laurie Hernandez, connected with  Laurie Hernandez  ([id number], [birth year]) on 01/06/25 at 12:00 PM EST by a video-enabled telemedicine application and verified that I am speaking with the correct person using two identifiers.  Location: Patient: Virtual Visit Location  Patient: Home Provider: Virtual Visit Location Provider: Home Office   I discussed the limitations of evaluation and management by telemedicine and the availability of in person appointments. The patient expressed understanding and agreed to proceed.    History of Present Illness: Laurie Hernandez is a 69 y.o. who identifies as a female who was assigned female at birth, and is being seen today for facial swelling.  HPI: Presents for evaluation of possible allergic reaction to contrast.  Patient reports that she had a mammogram study yesterday with contrast due to recent diagnosis.  She has not had contrast in the past before.  She reports she had no symptoms yesterday.  She woke up this morning with facial swelling, swelling around her eyes, redness on her face and neck.  Patient denies any swelling of lips, tongue, throat tightening, chest pain, shortness of breath.  She did take her vitals today listed below.  She treated with Benadryl  50 mg at 930 this morning.  The edema has improved somewhat around her eyes. 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  Resting comfortably  at home.  mild edema periorbital noted, erythema of the face,  No swelling of lips No labored breathing.  Speech is clear and coherent with logical content.  Patient is alert and oriented at baseline.    Assessment and Plan: 1. Facial edema (Primary) - predniSONE  (DELTASONE ) 10 MG tablet; Take 4 tablets (40 mg total) by mouth daily with breakfast for 3 days, THEN 2 tablets (20 mg total) daily with breakfast for 3 days, THEN 1 tablet (10 mg total) daily with breakfast for 3 days.  Dispense: 21 tablet; Refill: 0  2. Adverse effect of contrast media, initial encounter - predniSONE  (DELTASONE ) 10 MG tablet; Take 4 tablets (40 mg total) by mouth daily with breakfast for 3 days, THEN 2 tablets (20 mg total) daily with breakfast for 3 days, THEN 1 tablet (10 mg total) daily with breakfast for 3 days.  Dispense: 21 tablet; Refill: 0  Patient advised to continue taking Benadryl  every 6-8 hours as needed Prescription for prednisone  sent to the pharmacy Patient will continue to monitor her blood sugar at home Has taken steroids in the past without significant side effects Strict ER precautions discussed such as any new or worsening symptoms including worsening facial swelling, swelling of lips, tongue, chest pain, shortness of breath she is to call EMS.  Patient will inform her oncology doctors about this possible allergic reaction     Follow Up Instructions: I discussed the assessment and treatment plan with the patient. The patient was provided an opportunity to ask questions and all were answered. The patient agreed with the plan and demonstrated an understanding of the instructions.  A copy of instructions were sent to the patient via MyChart unless otherwise noted below.     The patient was advised to call back or seek an in-person evaluation if the symptoms worsen or if the condition fails to improve as  anticipated.    Laurie Sebastiano, PA-C     [1]  Allergies Allergen Reactions   Penicillins Rash    As a child - can't remember   Metformin  And Related Nausea And Vomiting  [2]  Current Outpatient Medications:    predniSONE  (DELTASONE ) 10 MG tablet, Take 4 tablets (40 mg total) by mouth daily with breakfast for 3 days, THEN 2 tablets (20 mg total) daily with breakfast for 3 days, THEN 1 tablet (10 mg total) daily with breakfast for 3 days., Disp: 21 tablet, Rfl: 0   apixaban  (ELIQUIS ) 5 MG TABS tablet, Take 1 tablet (5 mg total) by mouth 2 (two) times daily., Disp: 180 tablet, Rfl: 3   B Complex-C (B-COMPLEX WITH VITAMIN C) tablet, Take 1 tablet by mouth daily., Disp: , Rfl:    Biotin 89999 MCG TABS, Take 10,000 mcg by mouth daily., Disp: , Rfl:    Calcium  250 MG CAPS, Take 300 mg by mouth in the morning and at bedtime., Disp: , Rfl:    Cholecalciferol (VITAMIN D3) 2000 units TABS, Take 2,000 Units by mouth., Disp: , Rfl:    cycloSPORINE (RESTASIS) 0.05 % ophthalmic emulsion, Place  1 drop into both eyes 2 (two) times daily., Disp: , Rfl:    glipiZIDE  (GLUCOTROL  XL) 5 MG 24 hr tablet, Take 1 tablet (5 mg total) by mouth daily with breakfast., Disp: 90 tablet, Rfl: 3   JARDIANCE  25 MG TABS tablet, TAKE 1 TABLET BY MOUTH DAILY  BEFORE BREAKFAST, Disp: 100 tablet, Rfl: 2   MAGNESIUM GLYCINATE PO, Take by mouth., Disp: , Rfl:    Multiple Vitamins-Minerals (CENTRUM SILVER 50+WOMEN) TABS, Take 1 tablet by mouth daily., Disp: , Rfl:    rosuvastatin  (CRESTOR ) 20 MG tablet, TAKE 1 TABLET BY MOUTH DAILY, Disp: 100 tablet, Rfl: 2   SUMAtriptan  (IMITREX ) 100 MG tablet, Take 1 tab by mouth daily as needed for migraine.  Max 200 mg in 24 hours, Disp: 9 tablet, Rfl: 6  "

## 2025-01-08 ENCOUNTER — Encounter: Payer: Self-pay | Admitting: *Deleted

## 2025-01-08 ENCOUNTER — Telehealth: Payer: Self-pay | Admitting: *Deleted

## 2025-01-08 NOTE — Telephone Encounter (Signed)
 Spoke with patient to follow up from [facility] 1/28 and assess navigation needs and discuss CEM results. It shows a larger area that will need add bx's if she is wanting breast conservation which she states she would like to have lump instead of mastectomy if at all possible.  We will await Dr. Yvonne recommendations as patient would like to speak with him 1st.  Encouraged her to call should she have any additional questions or concerns. Patient verbalized understanding.

## 2025-01-09 ENCOUNTER — Telehealth: Payer: Self-pay

## 2025-01-09 ENCOUNTER — Other Ambulatory Visit: Payer: Self-pay | Admitting: General Surgery

## 2025-01-09 ENCOUNTER — Telehealth: Payer: Self-pay | Admitting: *Deleted

## 2025-01-09 ENCOUNTER — Encounter: Payer: Self-pay | Admitting: *Deleted

## 2025-01-09 DIAGNOSIS — C50511 Malignant neoplasm of lower-outer quadrant of right female breast: Secondary | ICD-10-CM

## 2025-01-09 NOTE — Telephone Encounter (Signed)
 I contacted Laurie Hernandez to discuss her genetic testing results. No pathogenic variants were identified in the 13 genes analyzed. I reviewed with Laurie Hernandez that the genes tested include the primary genes associated with hereditary breast cancer syndromes.   I will follow up with her once the remainder of her results are back.     Laurie Fryer, MS, CGC  Certified Genetic Counselor  Email: [email or web address]  Phone: [phone]

## 2025-01-09 NOTE — Telephone Encounter (Signed)
 Received after hours message from pt with complaint of facial swelling post contrast enhanced mammogram.  RN contact pt who states this was the first time she has ever received contrast media and the following day experienced significant orbital swelling as well as full facial swelling.  Pt states after hours nurse advised her to see PCP.  Pt was able to schedule virtual visit with a provider and was prescribed prednisone  for delayed contrast allergy.  MD notified who states pt will need 13 hour prep in the future for all contrast media scans.  Pt educated on this prep and verbalized understanding.

## 2025-01-10 ENCOUNTER — Other Ambulatory Visit: Payer: Self-pay | Admitting: General Surgery

## 2025-01-10 DIAGNOSIS — R928 Other abnormal and inconclusive findings on diagnostic imaging of breast: Secondary | ICD-10-CM

## 2025-01-11 ENCOUNTER — Encounter: Payer: Self-pay | Admitting: *Deleted

## 2025-01-12 ENCOUNTER — Other Ambulatory Visit: Payer: Self-pay | Admitting: General Surgery

## 2025-01-12 DIAGNOSIS — R928 Other abnormal and inconclusive findings on diagnostic imaging of breast: Secondary | ICD-10-CM

## 2025-01-15 ENCOUNTER — Institutional Professional Consult (permissible substitution): Admitting: Plastic Surgery

## 2025-01-15 ENCOUNTER — Encounter

## 2025-01-26 ENCOUNTER — Encounter

## 2025-01-29 ENCOUNTER — Encounter (HOSPITAL_COMMUNITY)

## 2025-01-29 ENCOUNTER — Ambulatory Visit (HOSPITAL_COMMUNITY): Admit: 2025-01-29 | Admitting: General Surgery

## 2025-01-29 ENCOUNTER — Encounter

## 2025-02-19 ENCOUNTER — Ambulatory Visit

## 2025-02-19 ENCOUNTER — Ambulatory Visit: Admitting: Radiation Oncology

## 2025-02-20 ENCOUNTER — Ambulatory Visit: Admitting: Radiation Oncology

## 2025-05-23 ENCOUNTER — Inpatient Hospital Stay: Admitting: Family

## 2025-05-23 ENCOUNTER — Inpatient Hospital Stay: Attending: Family
